# Patient Record
Sex: Male | Born: 1965
Health system: Southern US, Community
[De-identification: ages and names within clinical notes are randomized; demographics above are authoritative.]

## PROBLEM LIST (undated history)

## (undated) DIAGNOSIS — J309 Allergic rhinitis, unspecified: Secondary | ICD-10-CM

## (undated) DIAGNOSIS — I10 Essential (primary) hypertension: Secondary | ICD-10-CM

## (undated) DIAGNOSIS — M1990 Unspecified osteoarthritis, unspecified site: Secondary | ICD-10-CM

## (undated) DIAGNOSIS — K0501 Acute gingivitis, non-plaque induced: Secondary | ICD-10-CM

## (undated) DIAGNOSIS — J302 Other seasonal allergic rhinitis: Secondary | ICD-10-CM

## (undated) HISTORY — DX: Other seasonal allergic rhinitis: J30.2

## (undated) HISTORY — DX: Allergic rhinitis, unspecified: J30.9

## (undated) HISTORY — DX: Acute gingivitis, non-plaque induced: K05.01

## (undated) HISTORY — PX: FINGER FRACTURE SURGERY: SHX638

## (undated) HISTORY — DX: Essential (primary) hypertension: I10

## (undated) HISTORY — PX: WISDOM TOOTH EXTRACTION: SHX21

---

## 2008-06-17 ENCOUNTER — Ambulatory Visit: Payer: Self-pay | Admitting: Internal Medicine

## 2008-06-17 DIAGNOSIS — I10 Essential (primary) hypertension: Secondary | ICD-10-CM

## 2008-06-17 DIAGNOSIS — J309 Allergic rhinitis, unspecified: Secondary | ICD-10-CM

## 2008-06-17 HISTORY — DX: Allergic rhinitis, unspecified: J30.9

## 2008-06-17 HISTORY — DX: Essential (primary) hypertension: I10

## 2009-06-01 ENCOUNTER — Encounter: Payer: Self-pay | Admitting: Internal Medicine

## 2009-08-15 ENCOUNTER — Ambulatory Visit: Payer: Self-pay | Admitting: Internal Medicine

## 2009-08-15 DIAGNOSIS — K120 Recurrent oral aphthae: Secondary | ICD-10-CM

## 2009-08-29 ENCOUNTER — Telehealth: Payer: Self-pay | Admitting: Internal Medicine

## 2009-09-01 ENCOUNTER — Telehealth: Payer: Self-pay | Admitting: Internal Medicine

## 2009-09-05 ENCOUNTER — Encounter: Payer: Self-pay | Admitting: Internal Medicine

## 2009-09-07 ENCOUNTER — Ambulatory Visit: Payer: Self-pay | Admitting: Family Medicine

## 2009-09-07 DIAGNOSIS — K0501 Acute gingivitis, non-plaque induced: Secondary | ICD-10-CM

## 2009-09-07 HISTORY — DX: Acute gingivitis, non-plaque induced: K05.01

## 2010-02-09 ENCOUNTER — Ambulatory Visit: Payer: Self-pay | Admitting: Internal Medicine

## 2010-04-25 NOTE — Assessment & Plan Note (Signed)
Summary: 6 month fup//ccm/pt rescd//ccm   Vital Signs:  Cooley profile:   45 year old male Weight:      194 pounds Temp:     97.9 degrees F oral BP sitting:   110 / 80  (right arm) Cuff size:   regular  Vitals Entered By: Duard Brady LPN (February 09, 2010 8:31 AM) CC: 6 mos rov - doing ok   **declines flu vaccine Is Cooley Diabetic? No   CC:  6 mos rov - doing ok   **declines flu vaccine.  History of Present Illness: Philip Cooley who has a long history of hypertension.  He is seen today for his 6 month follow-up.  He is doing quite well.  No concerns or complaints.  He has well-controlled on amlodipine/benazapril.denies any cough or other side effects.  He does have a history of allergic rhinitis and at the stomatitis which has been stable.  Allergies: 1)  ! Penicillin G Sodium (Penicillin G Sodium)  Past History:  Past Medical History: Reviewed history from 08/15/2009 and no changes required. Allergic rhinitis Hypertension aphthous ulceration  Past Surgical History: Reviewed history from 06/17/2008 and no changes required. none  Family History: Reviewed history from 06/17/2008 and no changes required. father died of HIV-AIDS 48 mother, age 30, history of hypertension, osteoarthritis  One brother, history of hypertension one sister apparent MI at age 6.  History of hypertension and tobacco use  Social History: Reviewed history from 06/17/2008 and no changes required. Married 75 year old daughter and also a grandchild ; 51 year old son Regular exercise-yes-active at the gym and also plays basketball  Review of Systems  The Cooley denies anorexia, fever, weight loss, weight gain, vision loss, decreased hearing, hoarseness, chest pain, syncope, dyspnea on exertion, peripheral edema, prolonged cough, headaches, hemoptysis, abdominal pain, melena, hematochezia, severe indigestion/heartburn, hematuria, incontinence, genital sores, muscle weakness,  suspicious skin lesions, transient blindness, difficulty walking, depression, unusual weight change, abnormal bleeding, enlarged lymph nodes, angioedema, breast masses, and testicular masses.    Physical Exam  General:  Well-developed,well-nourished,in no acute distress; alert,appropriate and cooperative throughout examination Head:  Normocephalic and atraumatic without obvious abnormalities. No apparent alopecia or balding. Eyes:  No corneal or conjunctival inflammation noted. EOMI. Perrla. Funduscopic exam benign, without hemorrhages, exudates or papilledema. Vision grossly normal. Mouth:  Oral mucosa and oropharynx without lesions or exudates.  Teeth in good repair. Neck:  No deformities, masses, or tenderness noted. Lungs:  Normal respiratory effort, chest expands symmetrically. Lungs are clear to auscultation, no crackles or wheezes. Heart:  Normal rate and regular rhythm. S1 and S2 normal without gallop, murmur, click, rub or other extra sounds. Abdomen:  Bowel sounds positive,abdomen soft and non-tender without masses, organomegaly or hernias noted. Msk:  No deformity or scoliosis noted of thoracic or lumbar spine.   Pulses:  R and L carotid,radial,femoral,dorsalis pedis and posterior tibial pulses are full and equal bilaterally Extremities:  No clubbing, cyanosis, edema, or deformity noted with normal full range of motion of all joints.     Impression & Recommendations:  Problem # 1:  HYPERTENSION (ICD-401.9)  His updated medication list for this problem includes:    Lotrel 5-10 Mg Caps (Amlodipine besy-benazepril hcl) .Marland Kitchen... 1 once daily  His updated medication list for this problem includes:    Lotrel 5-10 Mg Caps (Amlodipine besy-benazepril hcl) .Marland Kitchen... 1 once daily  Problem # 2:  ALLERGIC RHINITIS (ICD-477.9)  Complete Medication List: 1)  Lotrel 5-10 Mg Caps (Amlodipine besy-benazepril hcl) .Marland Kitchen.. 1 once daily 2)  First-dukes Mouthwash Susp (Diphenhyd-hydrocort-nystatin) .Marland Kitchen.. 1  teaspoon swish and swallow 4 times daily 3)  Kenalog in Orabase  .... Use as directed four times a day as needed  Cooley Instructions: 1)  Please schedule a follow-up appointment in 6 months. 2)  Limit your Sodium (Salt). 3)  It is important that you exercise regularly at least 20 minutes 5 times a week. If you develop chest pain, have severe difficulty breathing, or feel very tired , stop exercising immediately and seek medical attention. 4)  Check your Blood Pressure regularly. If it is above: 150/90 you should make an appointment. Prescriptions: LOTREL 5-10 MG CAPS (AMLODIPINE BESY-BENAZEPRIL HCL) 1 once daily  #90 x 5   Entered and Authorized by:   Gordy Savers  MD   Signed by:   Gordy Savers  MD on 02/09/2010   Method used:   Faxed to ...       [facility] (mail-order)             , [place]         Ph:        Fax: [phone]   RxID:   [id number] LOTREL 5-10 MG CAPS (AMLODIPINE BESY-BENAZEPRIL HCL) 1 once daily  #90 x 5   Entered and Authorized by:   Gordy Savers  MD   Signed by:   Gordy Savers  MD on 02/09/2010   Method used:   Print then Give to Cooley   RxID:   [id number] LOTREL 5-10 MG CAPS (AMLODIPINE BESY-BENAZEPRIL HCL) 1 once daily  #90 x 5   Entered and Authorized by:   Gordy Savers  MD   Signed by:   Gordy Savers  MD on 02/09/2010   Method used:   Electronically to        [facility] [address] #[id number]* (retail)       [address] [place], [place]  [address]       Ph: [phone]       Fax: [phone]   RxID:   [id number]    Orders Added: 1)  Est. Cooley Level III [01093]

## 2010-04-25 NOTE — Assessment & Plan Note (Signed)
Summary: MOUTH SORES/NJR   Vital Signs:  Patient profile:   45 year old male Weight:      186 pounds Temp:     98.3 degrees F oral BP sitting:   110 / 70  (right arm) Cuff size:   regular  Vitals Entered By: Duard Brady LPN (Aug 15, 2009 3:25 PM) CC: c/o sores in mouth returning - seen in urgent care 1 mos ago given streoid for same dx  Is Patient Diabetic? No   CC:  c/o sores in mouth returning - seen in urgent care 1 mos ago given streoid for same dx .  History of Present Illness: 45 year old patient who was evaluated at the urgent care approximately month ago for some oral ulcerations.  He apparently was clear with Dukes Magic mouthwash with only partial benefit, but then responded to more topical cortisone preparations.  He describes some discomfort, mainly involving the buccal mucosa.  He has treated hypertension, and otherwise takes no medications  Preventive Screening-Counseling & Management  Alcohol-Tobacco     Smoking Status: never  Allergies: 1)  ! Penicillin G Sodium (Penicillin G Sodium)  Past History:  Past Medical History: Allergic rhinitis Hypertension aphthous ulceration  Past Surgical History: Reviewed history from 06/17/2008 and no changes required. none  Social History: Smoking Status:  never  Physical Exam  General:  Well-developed,well-nourished,in no acute distress; alert,appropriate and cooperative throughout examination Head:  Normocephalic and atraumatic without obvious abnormalities. No apparent alopecia or balding. Mouth:  aphthous ulcer(s).  scattered superficial ulcerations were noted involving primarily the buccal mucosal area near the lower dental line Neck:  No deformities, masses, or tenderness noted. no adenopathy   Impression & Recommendations:  Problem # 1:  HYPERTENSION (ICD-401.9)  His updated medication list for this problem includes:    Lotrel 5-10 Mg Caps (Amlodipine besy-benazepril hcl) .Marland Kitchen... 1 once daily  His  updated medication list for this problem includes:    Lotrel 5-10 Mg Caps (Amlodipine besy-benazepril hcl) .Marland Kitchen... 1 once daily  Problem # 2:  APHTHOUS STOMATITIS (ICD-528.2)  Complete Medication List: 1)  Lotrel 5-10 Mg Caps (Amlodipine besy-benazepril hcl) .Marland Kitchen.. 1 once daily 2)  First-dukes Mouthwash Susp (Diphenhyd-hydrocort-nystatin) .Marland Kitchen.. 1 teaspoon swish and swallow 4 times daily  Patient Instructions: 1)  Please schedule a follow-up appointment in 4 months. 2)  Limit your Sodium (Salt). 3)  It is important that you exercise regularly at least 20 minutes 5 times a week. If you develop chest pain, have severe difficulty breathing, or feel very tired , stop exercising immediately and seek medical attention. Prescriptions: FIRST-DUKES MOUTHWASH  SUSP (DIPHENHYD-HYDROCORT-NYSTATIN) 1 teaspoon swish and swallow 4 times daily  #6 oz x 0   Entered and Authorized by:   Gordy Savers  MD   Signed by:   Gordy Savers  MD on 08/15/2009   Method used:   Electronically to        [id number]* (retail)       [address] [place], [place]  [address]       Ph: [phone]       Fax: [phone]   RxID:   [id number] LOTREL 5-10 MG CAPS (AMLODIPINE BESY-BENAZEPRIL HCL) 1 once daily  #90 x 2   Entered and Authorized by:   Gordy Savers  MD   Signed by:   Gordy Savers  MD on 08/15/2009   Method used:   Electronically to  [facility] [id number]* (retail)       [address] [place], [place]  [address]       Ph: [phone]       Fax: [phone]   RxID:   [id number]

## 2010-04-25 NOTE — Assessment & Plan Note (Signed)
Summary: mouth lesions?/dm   Vital Signs:  Patient profile:   45 year old male Weight:      187 pounds Temp:     98.2 degrees F oral BP sitting:   120 / 80  (left arm)  Vitals Entered By: Kathrynn Speed CMA (September 07, 2009 4:34 PM) CC: Mouth Lesions for 2 months   History of Present Illness: Two-month history of gum irritation upper and lower gums with some buccal mucosal irritation as well. Has been seen both here and at urgent care Center and prescribed various medications which have not helped much. He has tried [employer] mouthwash which did not seem to help. Tried some type of paste presumably steroid paste from urgent care that did help some. Diagnosed with aphthous stomatitis. Symptoms have not really progressed but no better. No difficulty swallowing. Nonsmoker. No oral tobacco use. No recent use of hydrogen peroxide strips.  No diabetes.  Current Medications (verified): 1)  Lotrel 5-10 Mg Caps (Amlodipine Besy-Benazepril Hcl) .Marland Kitchen.. 1 Once Daily 2)  First-Dukes Mouthwash  Susp (Diphenhyd-Hydrocort-Nystatin) .Marland Kitchen.. 1 Teaspoon Swish and Swallow 4 Times Daily  Allergies (verified): 1)  ! Penicillin G Sodium (Penicillin G Sodium)  Past History:  Past Medical History: Last updated: 08/15/2009 Allergic rhinitis Hypertension aphthous ulceration  Review of Systems  The patient denies anorexia, fever, and weight loss.    Physical Exam  General:  Well-developed,well-nourished,in no acute distress; alert,appropriate and cooperative throughout examination Head:  no facial swelling evident Mouth:  patient has some gingivitis changes with gingival inflammation which is mild and some mild gum erythema diffusely especially lower gum line and bilaterally. Patient is noted have some mild erythema around Stensen's duct bilaterally but no purulent drainage. Neck:  No deformities, masses, or tenderness noted. Lungs:  Normal respiratory effort, chest expands symmetrically. Lungs are clear  to auscultation, no crackles or wheezes. Heart:  Normal rate and regular rhythm. S1 and S2 normal without gallop, murmur, click, rub or other extra sounds.   Impression & Recommendations:  Problem # 1:  ACUTE GINGIVITIS NONPLAQUE INDUCED (ICD-523.01) have recommended dental eval.  In meantime, reduce sugar intake, gently brushing of teeth, refill Kenalog in orabase which helped buccal irritation some.  No evidence for leukoplakia or thrush.  Complete Medication List: 1)  Lotrel 5-10 Mg Caps (Amlodipine besy-benazepril hcl) .Marland Kitchen.. 1 once daily 2)  First-dukes Mouthwash Susp (Diphenhyd-hydrocort-nystatin) .Marland Kitchen.. 1 teaspoon swish and swallow 4 times daily 3)  Kenalog in Orabase  .... Use as directed four times a day as needed  Patient Instructions: 1)  set up appointment with dentist for further evaluation 2)  Reduce sugar and starch intake 3)  Continue regular brushing of teeth and gentle flossing daily Prescriptions: KENALOG IN ORABASE use as directed four times a day as needed  #5 gm x 1   Entered by:   Sid Falcon LPN   Authorized by:   Evelena Peat MD   Signed by:   Sid Falcon LPN on 41/28/7867   Method used:   Telephoned to ...       [id number]* (retail)       [address] [place], [place]  [address]       Ph: [phone]       Fax: [phone]   RxID:   [id number]

## 2010-04-25 NOTE — Progress Notes (Signed)
Summary: [facility]  Phone Note Call from Patient Call back at Home Phone [phone]   Caller: Patient Call For: Philip Savers  MD Summary of Call: [facility] [phone]  Initial call taken by: Heron Sabins,  September 01, 2009 8:35 AM    Prescriptions: LOTREL 5-10 MG CAPS (AMLODIPINE BESY-BENAZEPRIL HCL) 1 once daily  #90 x 2   Entered by:   Duard Brady LPN   Authorized by:   Philip Savers  MD   Signed by:   Duard Brady LPN on 08/65/7846   Method used:   Faxed to ...       [facility] (mail-order)             ,          Ph: [phone]       Fax: [phone]   RxID:   [id number]  re [facility]ed to [facility]

## 2010-04-25 NOTE — Medication Information (Signed)
Summary: Coverage Approval for Amlodipine  Coverage Approval for Amlodipine   Imported By: Maryln Gottron 06/06/2009 13:02:37  _____________________________________________________________________  External Attachment:    Type:   Image     Comment:   External Document

## 2010-04-25 NOTE — Progress Notes (Signed)
Summary:  refill lotrel mail order  Phone Note Refill Request Message from:  Patient---live call on *******new pharmacy****  Refills Requested: Medication #1:  LOTREL 5-10 MG CAPS 1 once daily   Brand Name Necessary? No [facility].....******[facility] pharmacy******. Cannot fill thru local pharmacy anymore. They denied his local refill.  Initial call taken by: Warnell Forester,  August 29, 2009 10:53 AM Caller: Patient  Follow-up for Phone Call        done. KIK Follow-up by: Duard Brady LPN,  August 29, 4257 11:10 AM    Prescriptions: LOTREL 5-10 MG CAPS (AMLODIPINE BESY-BENAZEPRIL HCL) 1 once daily  #90 x 2   Entered by:   Duard Brady LPN   Authorized by:   Gordy Savers  MD   Signed by:   Duard Brady LPN on 56/38/7564   Method used:   Faxed to ...       [facility] (mail-order)             ,          Ph: [phone]       Fax: [phone]   RxID:   [id number]  [facility]/t [facility] thru local [facility]. Pt aware. KIK

## 2010-04-25 NOTE — Medication Information (Signed)
Summary: Coverage Approval for Amlodipine Besylate  Coverage Approval for Amlodipine Besylate   Imported By: Maryln Gottron 09/08/2009 08:57:17  _____________________________________________________________________  External Attachment:    Type:   Image     Comment:   External Document

## 2010-06-23 ENCOUNTER — Encounter: Payer: Self-pay | Admitting: Internal Medicine

## 2010-06-23 ENCOUNTER — Ambulatory Visit (INDEPENDENT_AMBULATORY_CARE_PROVIDER_SITE_OTHER): Payer: 59 | Admitting: Internal Medicine

## 2010-06-23 DIAGNOSIS — I10 Essential (primary) hypertension: Secondary | ICD-10-CM

## 2010-06-23 DIAGNOSIS — J309 Allergic rhinitis, unspecified: Secondary | ICD-10-CM

## 2010-06-23 DIAGNOSIS — R51 Headache: Secondary | ICD-10-CM

## 2010-06-23 MED ORDER — TRAMADOL HCL 50 MG PO TABS: 50.0000 mg | ORAL_TABLET | Freq: Four times a day (QID) | ORAL | Status: DC | PRN

## 2010-06-23 NOTE — Progress Notes (Signed)
  Subjective:    Patient ID: Philip Cooley, male    DOB: [birth year], 45 y.o.   MRN: [id number]  HPI  45 year old patient who is seen today for followup. He has not been seen in approximately 10 months. He does have a history of hypertension and allergic rhinitis. He states he has a history of migraine headaches in the past it had been well controlled with Excedrin Migraine. For the past 2 months has had increasing posterior neck pain. He is taking very little the way of medication. He apparently was seen for a wellness visits elsewhere approximately 2 months ago and blood pressure was slightly high at that time he has been on Lotrel for blood pressure control. Not much in way of allergy symptoms.    Review of Systems  Constitutional: Negative for fever, chills, appetite change and fatigue.  HENT: Negative for hearing loss, ear pain, congestion, sore throat, trouble swallowing, neck stiffness, dental problem, voice change and tinnitus.   Eyes: Negative for pain, discharge and visual disturbance.  Respiratory: Negative for cough, chest tightness, wheezing and stridor.   Cardiovascular: Negative for chest pain, palpitations and leg swelling.  Gastrointestinal: Negative for nausea, vomiting, abdominal pain, diarrhea, constipation, blood in stool and abdominal distention.  Genitourinary: Negative for urgency, hematuria, flank pain, discharge, difficulty urinating and genital sores.  Musculoskeletal: Negative for myalgias, back pain, joint swelling, arthralgias and gait problem.  Skin: Negative for rash.  Neurological: Positive for headaches. Negative for dizziness, syncope, speech difficulty, weakness and numbness.  Hematological: Negative for adenopathy. Does not bruise/bleed easily.  Psychiatric/Behavioral: Negative for behavioral problems and dysphoric mood. The patient is not nervous/anxious.        Objective:   Physical Exam  Constitutional: He is oriented to person, place, and time. He  appears well-developed and well-nourished. No distress.       Blood pressure 120/80.  HENT:  Head: Normocephalic.  Right Ear: External ear normal.  Left Ear: External ear normal.  Eyes: Conjunctivae and EOM are normal.  Neck: Normal range of motion. Neck supple.  Cardiovascular: Normal rate and normal heart sounds.   Pulmonary/Chest: Breath sounds normal.  Abdominal: Bowel sounds are normal.  Musculoskeletal: Normal range of motion. He exhibits no edema and no tenderness.  Lymphadenopathy:    He has no cervical adenopathy.  Neurological: He is alert and oriented to person, place, and time.  Psychiatric: He has a normal mood and affect. His behavior is normal.          Assessment & Plan:  Headache. Suspect this is more from muscular ligamentous neck pain. Does not sound like a migraine syndrome or related to allergies. We'll treat allergies symptomatically with a once a day nonsedating antihistamine. We'll try tramadol if headaches are refractory to Excedrin Migraine which have been quite helpful in the past

## 2010-06-23 NOTE — Patient Instructions (Signed)
Limit your sodium (Salt) intake  Please check your blood pressure on a regular basis.  If it is consistently greater than 150/90, please make an office appointment.  Return in 6 months for follow-up  Call or return to clinic prn if these symptoms worsen or fail to improve as anticipated.   

## 2010-07-05 ENCOUNTER — Other Ambulatory Visit: Payer: Self-pay | Admitting: Internal Medicine

## 2010-07-05 NOTE — Telephone Encounter (Signed)
Pt needs new rx amlodipine-benzepril 5-10mg  #90 with 3 refills.  Please call [phone] [facility] or [facility] fax

## 2010-07-06 MED ORDER — AMLODIPINE BESY-BENAZEPRIL HCL 5-10 MG PO CAPS: 1.0000 | ORAL_CAPSULE | Freq: Every day | ORAL | Status: DC

## 2010-07-06 NOTE — Telephone Encounter (Signed)
Sent to [facility] 

## 2010-08-10 ENCOUNTER — Other Ambulatory Visit (INDEPENDENT_AMBULATORY_CARE_PROVIDER_SITE_OTHER): Payer: 59 | Admitting: Internal Medicine

## 2010-08-10 DIAGNOSIS — Z0000 Encounter for general adult medical examination without abnormal findings: Secondary | ICD-10-CM

## 2010-08-10 LAB — CBC WITH DIFFERENTIAL/PLATELET
Eosinophils Relative: 5.3 % — ABNORMAL HIGH (ref 0.0–5.0)
HCT: 50.7 % (ref 39.0–52.0)
Lymphs Abs: 1.9 10*3/uL (ref 0.7–4.0)
MCV: 89.4 fl (ref 78.0–100.0)
Monocytes Absolute: 0.6 10*3/uL (ref 0.1–1.0)
Platelets: 208 10*3/uL (ref 150.0–400.0)
RDW: 13.7 % (ref 11.5–14.6)
WBC: 5.6 10*3/uL (ref 4.5–10.5)

## 2010-08-10 LAB — POCT URINALYSIS DIPSTICK
Bilirubin, UA: NEGATIVE
Glucose, UA: NEGATIVE
Ketones, UA: NEGATIVE
Spec Grav, UA: 1.03

## 2010-08-10 LAB — BASIC METABOLIC PANEL
BUN: 13 mg/dL (ref 6–23)
Chloride: 107 mEq/L (ref 96–112)
Glucose, Bld: 84 mg/dL (ref 70–99)
Potassium: 4.1 mEq/L (ref 3.5–5.1)

## 2010-08-10 LAB — TSH: TSH: 0.76 u[IU]/mL (ref 0.35–5.50)

## 2010-08-10 LAB — LIPID PANEL
Cholesterol: 137 mg/dL (ref 0–200)
LDL Cholesterol: 83 mg/dL (ref 0–99)
Triglycerides: 47 mg/dL (ref 0.0–149.0)

## 2010-08-10 LAB — HEPATIC FUNCTION PANEL
ALT: 25 U/L (ref 0–53)
Albumin: 3.4 g/dL — ABNORMAL LOW (ref 3.5–5.2)
Total Bilirubin: 0.9 mg/dL (ref 0.3–1.2)

## 2010-08-17 ENCOUNTER — Encounter: Payer: Self-pay | Admitting: Internal Medicine

## 2010-08-17 ENCOUNTER — Ambulatory Visit (INDEPENDENT_AMBULATORY_CARE_PROVIDER_SITE_OTHER): Payer: 59 | Admitting: Internal Medicine

## 2010-08-17 VITALS — BP 114/70 | HR 70 | Temp 98.1°F | Resp 16 | Ht 66.0 in | Wt 186.0 lb

## 2010-08-17 DIAGNOSIS — Z0000 Encounter for general adult medical examination without abnormal findings: Secondary | ICD-10-CM

## 2010-08-17 MED ORDER — AMLODIPINE BESY-BENAZEPRIL HCL 5-10 MG PO CAPS: 1.0000 | ORAL_CAPSULE | Freq: Every day | ORAL | Status: DC

## 2010-08-17 NOTE — Patient Instructions (Signed)
Limit your sodium (Salt) intake    It is important that you exercise regularly, at least 20 minutes 3 to 4 times per week.  If you develop chest pain or shortness of breath seek  medical attention.  Please check your blood pressure on a regular basis.  If it is consistently greater than 150/90, please make an office appointment.  Return in one year for follow-up  

## 2010-08-17 NOTE — Progress Notes (Signed)
  Subjective:    Patient ID: Philip Cooley, male    DOB: 07-22-1965, 45 y.o.   MRN: [id number]  HPI  45 year old patient who is seen today for a health maintenance examination. He has a long history of hypertension which has been well controlled. No concerns or complaints. He remains a very active lifestyle with regular exercise at his health club.  Past Medical History  Diagnosis Date  . ACUTE GINGIVITIS NONPLAQUE INDUCED 09/07/2009  . ALLERGIC RHINITIS 06/17/2008  . HYPERTENSION 06/17/2008   No past surgical history on file.  reports that he has never smoked. He has never used smokeless tobacco. He reports that he does not drink alcohol or use illicit drugs. family history is not on file. Allergies  Allergen Reactions  . Penicillins        Review of Systems  Constitutional: Negative for fever, chills, activity change, appetite change and fatigue.  HENT: Negative for hearing loss, ear pain, congestion, rhinorrhea, sneezing, mouth sores, trouble swallowing, neck pain, neck stiffness, dental problem, voice change, sinus pressure and tinnitus.   Eyes: Negative for photophobia, pain, redness and visual disturbance.  Respiratory: Negative for apnea, cough, choking, chest tightness, shortness of breath and wheezing.   Cardiovascular: Negative for chest pain, palpitations and leg swelling.  Gastrointestinal: Negative for nausea, vomiting, abdominal pain, diarrhea, constipation, blood in stool, abdominal distention, anal bleeding and rectal pain.  Genitourinary: Negative for dysuria, urgency, frequency, hematuria, flank pain, decreased urine volume, discharge, penile swelling, scrotal swelling, difficulty urinating, genital sores and testicular pain.  Musculoskeletal: Negative for myalgias, back pain, joint swelling, arthralgias and gait problem.  Skin: Negative for color change, rash and wound.  Neurological: Negative for dizziness, tremors, seizures, syncope, facial asymmetry, speech  difficulty, weakness, light-headedness, numbness and headaches.  Hematological: Negative for adenopathy. Does not bruise/bleed easily.  Psychiatric/Behavioral: Negative for suicidal ideas, hallucinations, behavioral problems, confusion, sleep disturbance, self-injury, dysphoric mood, decreased concentration and agitation. The patient is not nervous/anxious.        Objective:   Physical Exam  Constitutional: He appears well-developed and well-nourished.  HENT:  Head: Normocephalic and atraumatic.  Right Ear: External ear normal.  Left Ear: External ear normal.  Nose: Nose normal.  Mouth/Throat: Oropharynx is clear and moist.  Eyes: Conjunctivae and EOM are normal. Pupils are equal, round, and reactive to light. No scleral icterus.  Neck: Normal range of motion. Neck supple. No JVD present. No thyromegaly present.  Cardiovascular: Regular rhythm, normal heart sounds and intact distal pulses.  Exam reveals no gallop and no friction rub.   No murmur heard. Pulmonary/Chest: Effort normal and breath sounds normal. He exhibits no tenderness.  Abdominal: Soft. Bowel sounds are normal. He exhibits no distension and no mass. There is no tenderness.  Genitourinary: Prostate normal and penis normal.  Musculoskeletal: Normal range of motion. He exhibits no edema and no tenderness.  Lymphadenopathy:    He has no cervical adenopathy.  Neurological: He is alert. He has normal reflexes. No cranial nerve deficit. Coordination normal.  Skin: Skin is warm and dry. No rash noted.  Psychiatric: He has a normal mood and affect. His behavior is normal.          Assessment & Plan:    Annual examination Hypertension. Well controlled  We'll continue present regimen low-salt diet and regular exercise routine. Will recheck in one year or when necessary. Home blood pressure monitoring will be continued

## 2011-02-22 ENCOUNTER — Ambulatory Visit (INDEPENDENT_AMBULATORY_CARE_PROVIDER_SITE_OTHER): Payer: 59 | Admitting: Internal Medicine

## 2011-02-22 ENCOUNTER — Encounter: Payer: Self-pay | Admitting: Internal Medicine

## 2011-02-22 DIAGNOSIS — I10 Essential (primary) hypertension: Secondary | ICD-10-CM

## 2011-02-22 DIAGNOSIS — J069 Acute upper respiratory infection, unspecified: Secondary | ICD-10-CM

## 2011-02-22 NOTE — Progress Notes (Signed)
  Subjective:    Patient ID: Philip Cooley, male    DOB: [birth year], 46 y.o.   MRN: [id number]  HPI  45 year old patient who has a history of treated hypertension. For the past 5 days he has had increasing cough congestion rhinorrhea sneezing and some fever and chills. Denies any productive cough wheezing shortness of breath or chest pain. His blood pressure has been well-controlled    Review of Systems  Constitutional: Negative for fever, chills, appetite change and fatigue.  HENT: Positive for congestion, rhinorrhea and voice change. Negative for hearing loss, ear pain, sore throat, trouble swallowing, neck stiffness, dental problem and tinnitus.   Eyes: Negative for pain, discharge and visual disturbance.  Respiratory: Positive for cough. Negative for chest tightness, wheezing and stridor.   Cardiovascular: Negative for chest pain, palpitations and leg swelling.  Gastrointestinal: Negative for nausea, vomiting, abdominal pain, diarrhea, constipation, blood in stool and abdominal distention.  Genitourinary: Negative for urgency, hematuria, flank pain, discharge, difficulty urinating and genital sores.  Musculoskeletal: Negative for myalgias, back pain, joint swelling, arthralgias and gait problem.  Skin: Negative for rash.  Neurological: Negative for dizziness, syncope, speech difficulty, weakness, numbness and headaches.  Hematological: Negative for adenopathy. Does not bruise/bleed easily.  Psychiatric/Behavioral: Negative for behavioral problems and dysphoric mood. The patient is not nervous/anxious.        Objective:   Physical Exam  Constitutional: He is oriented to person, place, and time. He appears well-developed.  HENT:  Head: Normocephalic.  Right Ear: External ear normal.  Left Ear: External ear normal.       Mild erythema of the oropharynx  Eyes: Conjunctivae and EOM are normal.  Neck: Normal range of motion.  Cardiovascular: Normal rate and normal heart sounds.     Pulmonary/Chest: Breath sounds normal.  Abdominal: Bowel sounds are normal.  Musculoskeletal: Normal range of motion. He exhibits no edema and no tenderness.  Neurological: He is alert and oriented to person, place, and time.  Psychiatric: He has a normal mood and affect. His behavior is normal.          Assessment & Plan:    Hypertension well controlled. Viral URI. We'll treat symptomatically

## 2011-02-22 NOTE — Patient Instructions (Signed)
ZUTRIPRO 1 teaspoon every 6 hours as needed for cough and congestion  VIMOVO  1 twice daily for fever pain and inflammation

## 2011-06-26 ENCOUNTER — Encounter: Payer: Self-pay | Admitting: Internal Medicine

## 2011-06-26 ENCOUNTER — Ambulatory Visit (INDEPENDENT_AMBULATORY_CARE_PROVIDER_SITE_OTHER): Payer: 59 | Admitting: Internal Medicine

## 2011-06-26 VITALS — BP 106/74 | Temp 98.5°F | Wt 189.0 lb

## 2011-06-26 DIAGNOSIS — I10 Essential (primary) hypertension: Secondary | ICD-10-CM

## 2011-06-26 NOTE — Progress Notes (Signed)
  Subjective:    Patient ID: Michail Sermon, male    DOB: 1965-05-08, 46 y.o.   MRN: [id number]  HPI  46 year old patient who has a history of treated hypertension. He presents today with a chief complaint of low back pain left lumbar area greater than the right.  The patient first noticed some tightness in the left lumbar area while bowling 4 days ago. Pain worsened throughout the day but for the past 2 days seems to have improved. He has been using naproxen which has been quite helpful. Denies any constitutional complaints. No radicular symptoms    Review of Systems  Musculoskeletal: Positive for back pain.       Objective:   Physical Exam  Constitutional: He appears well-developed and well-nourished. No distress.       Blood pressure well controlled  Musculoskeletal:       The lumbar musculature bilaterally slightly tight and tense straight leg test negative  Full range of motion of both hips           Assessment & Plan:   Musculoskeletal lumbar strain. We'll continue rest naproxen and heat. Hypertension stable  Recheck 6 months

## 2011-06-26 NOTE — Patient Instructions (Signed)
Most patients with low back pain will improve with time over the next two to 6 weeks.  Keep active but avoid any activities that cause pain.  Apply moist heat to the low back area several times daily.  Return in 6 months for follow-up  Please check your blood pressure on a regular basis.  If it is consistently greater than 150/90, please make an office appointment.

## 2011-07-20 ENCOUNTER — Other Ambulatory Visit: Payer: Self-pay | Admitting: Internal Medicine

## 2011-07-20 MED ORDER — AMLODIPINE BESY-BENAZEPRIL HCL 5-10 MG PO CAPS: 1.0000 | ORAL_CAPSULE | Freq: Every day | ORAL | Status: DC

## 2011-07-20 NOTE — Telephone Encounter (Signed)
Pt needs amlodipine-benazepril 5-10mg  #90 with 3 refills [facility] [phone] phone # [phone]

## 2011-07-20 NOTE — Telephone Encounter (Signed)
done

## 2011-09-29 ENCOUNTER — Emergency Department (HOSPITAL_COMMUNITY): Payer: Worker's Compensation

## 2011-09-29 ENCOUNTER — Emergency Department (HOSPITAL_COMMUNITY)
Admission: EM | Admit: 2011-09-29 | Discharge: 2011-09-29 | Disposition: A | Discharge status: Home / Self Care | Payer: Worker's Compensation | Attending: Emergency Medicine | Admitting: Emergency Medicine

## 2011-09-29 ENCOUNTER — Encounter (HOSPITAL_COMMUNITY): Payer: Self-pay | Admitting: Emergency Medicine

## 2011-09-29 DIAGNOSIS — Y9289 Other specified places as the place of occurrence of the external cause: Secondary | ICD-10-CM | POA: Insufficient documentation

## 2011-09-29 DIAGNOSIS — X500XXA Overexertion from strenuous movement or load, initial encounter: Secondary | ICD-10-CM | POA: Insufficient documentation

## 2011-09-29 DIAGNOSIS — S93409A Sprain of unspecified ligament of unspecified ankle, initial encounter: Secondary | ICD-10-CM

## 2011-09-29 DIAGNOSIS — S8990XA Unspecified injury of unspecified lower leg, initial encounter: Secondary | ICD-10-CM | POA: Insufficient documentation

## 2011-09-29 DIAGNOSIS — I10 Essential (primary) hypertension: Secondary | ICD-10-CM | POA: Insufficient documentation

## 2011-09-29 DIAGNOSIS — Y990 Civilian activity done for income or pay: Secondary | ICD-10-CM | POA: Insufficient documentation

## 2011-09-29 MED ORDER — IBUPROFEN 800 MG PO TABS: 800.0000 mg | ORAL_TABLET | Freq: Three times a day (TID) | ORAL | Status: AC

## 2011-09-29 MED ORDER — HYDROCODONE-ACETAMINOPHEN 5-500 MG PO TABS: 1.0000 | ORAL_TABLET | Freq: Four times a day (QID) | ORAL | Status: AC | PRN

## 2011-09-29 NOTE — ED Provider Notes (Signed)
History     CSN: [id number]  Arrival date & time 09/29/11  1108   First MD Initiated Contact with Patient 09/29/11 1131      Chief Complaint  Patient presents with  . Ankle Pain    0726- pt "rolled l/ankle" will at work    (Consider location/radiation/quality/duration/timing/severity/associated sxs/prior treatment) HPI Comments: Patient reports that he rolled his left ankle.  He is currently having pain over the medial malleolus.  He is able to bear weight, but reports increased pain with ambulation and movement.  No prior injury to the ankle.  Patient is a 46 y.o. male presenting with ankle pain. The history is provided by the patient.  Ankle Pain  The incident occurred 1 to 2 hours ago. The incident occurred at work. The pain is present in the left ankle. The quality of the pain is described as aching. The pain is moderate. The pain has been constant since onset. Pertinent negatives include no numbness, no loss of motion, no loss of sensation and no tingling. Associated symptoms comments: Pain with ambulation. The symptoms are aggravated by bearing weight and palpation (bending of the ankle). He has tried nothing for the symptoms.    Past Medical History  Diagnosis Date  . ACUTE GINGIVITIS NONPLAQUE INDUCED 09/07/2009  . ALLERGIC RHINITIS 06/17/2008  . HYPERTENSION 06/17/2008    History reviewed. No pertinent past surgical history.  Family History  Problem Relation Age of Onset  . Diabetes Mother   . Cancer Mother     History  Substance Use Topics  . Smoking status: Never Smoker   . Smokeless tobacco: Never Used  . Alcohol Use: No      Review of Systems  Constitutional: Negative for fever and chills.  HENT: Negative for drooling and trouble swallowing.   Gastrointestinal: Negative for nausea and vomiting.  Musculoskeletal: Negative for joint swelling.       Pain with ambulation  Skin: Negative for color change and wound.  Neurological: Negative for tingling and  numbness.    Allergies  Penicillins  Home Medications   Current Outpatient Rx  Name Route Sig Dispense Refill  . AMLODIPINE BESY-BENAZEPRIL HCL 5-10 MG PO CAPS Oral Take 1 capsule by mouth daily. 90 capsule 3  . CETIRIZINE HCL 10 MG PO TABS Oral Take 10 mg by mouth daily.    . ADULT MULTIVITAMIN W/MINERALS CH Oral Take 1 tablet by mouth daily.    Marland Kitchen OVER THE COUNTER MEDICATION Oral Take 1 capsule by mouth daily. Over the counter for joints-not glucosamine      BP 112/77  Pulse 66  Temp 98.6 F (37 C) (Oral)  Resp 18  SpO2 99%  Physical Exam  Nursing note and vitals reviewed. Constitutional: He is oriented to person, place, and time. He appears well-developed and well-nourished. No distress.  HENT:  Head: Normocephalic and atraumatic. No trismus in the jaw.  Mouth/Throat: Uvula is midline, oropharynx is clear and moist and mucous membranes are normal. Abnormal dentition. No dental abscesses or uvula swelling. No oropharyngeal exudate, posterior oropharyngeal edema, posterior oropharyngeal erythema or tonsillar abscesses.  Eyes: Conjunctivae and EOM are normal.  Neck: Normal range of motion and full passive range of motion without pain. Neck supple.  Cardiovascular: Normal rate, regular rhythm, normal heart sounds and intact distal pulses.   Pulses:      Dorsalis pedis pulses are 2+ on the right side, and 2+ on the left side.  Pulmonary/Chest: Effort normal and breath sounds normal. No respiratory  distress. He has no wheezes.  Musculoskeletal: Normal range of motion.       Left ankle: He exhibits no ecchymosis, no deformity and normal pulse. tenderness. Medial malleolus tenderness found. No lateral malleolus tenderness found. Achilles tendon normal. Achilles tendon exhibits no pain and no defect.       Pain with ROM of left ankle  Neurological: He is alert and oriented to person, place, and time. No sensory deficit.  Skin: Skin is warm and dry. He is not diaphoretic.    ED  Course  Procedures (including critical care time)  Labs Reviewed - No data to display Dg Ankle Complete Left  09/29/2011  *RADIOLOGY REPORT*  Clinical Data: Larey Seat and injured left ankle, medial pain.  LEFT ANKLE COMPLETE - 3+ VIEW  Comparison: None.  Findings: Well corticated ossific fragment adjacent to the tip of medial malleolus.  No evidence of acute fracture or dislocation. Ankle mortise intact with well-preserved joint space.  No visible joint effusion.  Small plantar calcaneal spur.  IMPRESSION: Accessory ossicle or old injury with dystrophic calcification adjacent to the tip of the medial malleolus.  No acute fractures. Small plantar calcaneal spur.  Original Report Authenticated By: Arnell Sieving, M.D.     No diagnosis found.    MDM  Negative xray.  Neurovascularly intact.  Patient given ankle ASO.  Patient declined crutches.        Pascal Lux [place], PA-C 09/29/11 1547

## 2011-09-29 NOTE — ED Notes (Signed)
Pt reports that he rolled l/ankle at work, pt was walking arounf side of wood palate and stepped on a piece of wood

## 2011-09-30 NOTE — ED Provider Notes (Signed)
Medical screening examination/treatment/procedure(s) were performed by non-physician practitioner and as supervising physician I was immediately available for consultation/collaboration.  Carrolyn Hilmes T Regana Kemple, MD 09/30/11 1512 

## 2011-11-06 ENCOUNTER — Telehealth: Payer: Self-pay | Admitting: Internal Medicine

## 2011-11-06 MED ORDER — AMLODIPINE BESY-BENAZEPRIL HCL 5-10 MG PO CAPS: 1.0000 | ORAL_CAPSULE | Freq: Every day | ORAL | Status: DC

## 2011-11-06 NOTE — Telephone Encounter (Signed)
Patient called stating that he need a refill of his Lotrel sent to [facility] and he also is completely out and would like to have some called into [facility] [place]. Please assist.

## 2011-11-06 NOTE — Telephone Encounter (Signed)
Done to both

## 2013-01-27 ENCOUNTER — Ambulatory Visit (INDEPENDENT_AMBULATORY_CARE_PROVIDER_SITE_OTHER): Payer: 59 | Admitting: Internal Medicine

## 2013-01-27 ENCOUNTER — Encounter: Payer: Self-pay | Admitting: Internal Medicine

## 2013-01-27 VITALS — BP 132/90 | HR 66 | Temp 98.2°F | Resp 20 | Wt 181.0 lb

## 2013-01-27 DIAGNOSIS — I10 Essential (primary) hypertension: Secondary | ICD-10-CM

## 2013-01-27 DIAGNOSIS — J309 Allergic rhinitis, unspecified: Secondary | ICD-10-CM

## 2013-01-27 DIAGNOSIS — Z0000 Encounter for general adult medical examination without abnormal findings: Secondary | ICD-10-CM

## 2013-01-27 MED ORDER — AMLODIPINE BESY-BENAZEPRIL HCL 5-10 MG PO CAPS: 1.0000 | ORAL_CAPSULE | Freq: Every day | ORAL | Status: DC

## 2013-01-27 NOTE — Patient Instructions (Signed)
Limit your sodium (Salt) intake  Please check your blood pressure on a regular basis.  If it is consistently greater than 150/90, please make an office appointment.  Return in one year for follow-up  

## 2013-01-27 NOTE — Progress Notes (Signed)
  Subjective:    Patient ID: Philip Cooley, male    DOB: [birth year], 47 y.o.   MRN: [id number]  HPI  Pre-visit discussion using our clinic review tool. No additional management support is needed unless otherwise documented below in the visit note.  Wt Readings from Last 3 Encounters:  01/27/13 181 lb (82.101 kg)  06/26/11 189 lb (85.73 kg)  02/22/11 188 lb (85.276 kg)    BP Readings from Last 3 Encounters:  01/27/13 132/90  09/29/11 109/62  06/26/11 106/74    Review of Systems     Objective:   Physical Exam        Assessment & Plan:

## 2013-01-27 NOTE — Progress Notes (Signed)
Subjective:    Patient ID: Philip Cooley, male    DOB: 02-05-1966, 47 y.o.   MRN: [id number]  HPI  47 year old patient who has a history of hypertension as well as allergic rhinitis. He has not been seen in over one year. He is seen today for health maintenance. He had a health screen at work that included a laboratory draw. No concerns or complaints. 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 He has had some modest weight loss over the past year he has treated hypertension which has been stable. His allergies have been stable No new concerns or complaints  Past Medical History  Diagnosis Date  . ACUTE GINGIVITIS NONPLAQUE INDUCED 09/07/2009  . ALLERGIC RHINITIS 06/17/2008  . HYPERTENSION 06/17/2008    History   Social History  . Marital Status: Married    Spouse Name: N/A    Number of Children: N/A  . Years of Education: N/A   Occupational History  . Not on file.   Social History Main Topics  . Smoking status: Never Smoker   . Smokeless tobacco: Never Used  . Alcohol Use: No  . Drug Use: No  . Sexual Activity: Not on file   Other Topics Concern  . Not on file   Social History Narrative  . No narrative on file    History reviewed. No pertinent past surgical history.  Family History  Problem Relation Age of Onset  . Diabetes Mother   . Cancer Mother     Allergies  Allergen Reactions  . Penicillins Swelling    Current Outpatient Prescriptions on File Prior to Visit  Medication Sig Dispense Refill  . cetirizine (ZYRTEC) 10 MG tablet Take 10 mg by mouth daily.      . Multiple Vitamin (MULTIVITAMIN WITH MINERALS) TABS Take 1 tablet by mouth daily.      Marland Kitchen OVER THE COUNTER MEDICATION Take 1 capsule by mouth daily. Over the counter for joints-not glucosamine       No current facility-administered medications on file prior to visit.    BP 132/90  Pulse 66  Temp(Src) 98.2 F (36.8 C) (Oral)  Resp 20  Wt 181  lb (82.101 kg)  SpO2 97%       Review of Systems  Constitutional: Negative for fever, chills, appetite change and fatigue.  HENT: Negative for congestion, dental problem, ear pain, hearing loss, sore throat, tinnitus, trouble swallowing and voice change.   Eyes: Negative for pain, discharge and visual disturbance.  Respiratory: Negative for cough, chest tightness, wheezing and stridor.   Cardiovascular: Negative for chest pain, palpitations and leg swelling.  Gastrointestinal: Negative for nausea, vomiting, abdominal pain, diarrhea, constipation, blood in stool and abdominal distention.  Genitourinary: Negative for urgency, hematuria, flank pain, discharge, difficulty urinating and genital sores.  Musculoskeletal: Negative for arthralgias, back pain, gait problem, joint swelling, myalgias and neck stiffness.  Skin: Negative for rash.  Neurological: Negative for dizziness, syncope, speech difficulty, weakness, numbness and headaches.  Hematological: Negative for adenopathy. Does not bruise/bleed easily.  Psychiatric/Behavioral: Negative for behavioral problems and dysphoric mood. The patient is not nervous/anxious.        Objective:   Physical Exam  Constitutional: He is oriented to person, place, and time. He appears well-developed.  Repeat blood pressure 120/84  HENT:  Head: Normocephalic.  Right Ear: External ear normal.  Left Ear: External ear normal.  Eyes: Conjunctivae and EOM are normal.  Neck: Normal range of motion.  Cardiovascular: Normal rate and normal heart sounds.   Pulmonary/Chest: Breath sounds normal.  Abdominal: Bowel sounds are normal.  Musculoskeletal: Normal range of motion. He exhibits no edema and no tenderness.  Neurological: He is alert and oriented to person, place, and time.  Psychiatric: He has a normal mood and affect. His behavior is normal.          Assessment & Plan:   Health maintenance examination Hypertension stable History of  allergic rhinitis stable  Patient had a lateral at work. Will complete paperwork for his annual physical when available and fax More  regular home blood pressure monitoring  encouraged Low-salt diet recommended

## 2013-08-18 ENCOUNTER — Ambulatory Visit (INDEPENDENT_AMBULATORY_CARE_PROVIDER_SITE_OTHER): Payer: 59 | Admitting: Internal Medicine

## 2013-08-18 ENCOUNTER — Encounter: Payer: Self-pay | Admitting: Internal Medicine

## 2013-08-18 VITALS — BP 130/90 | HR 95 | Temp 98.1°F | Ht 66.0 in | Wt 180.0 lb

## 2013-08-18 DIAGNOSIS — I10 Essential (primary) hypertension: Secondary | ICD-10-CM

## 2013-08-18 DIAGNOSIS — M5010 Cervical disc disorder with radiculopathy, unspecified cervical region: Secondary | ICD-10-CM

## 2013-08-18 DIAGNOSIS — M5412 Radiculopathy, cervical region: Secondary | ICD-10-CM

## 2013-08-18 MED ORDER — TRAMADOL HCL 50 MG PO TABS: 50.0000 mg | ORAL_TABLET | Freq: Four times a day (QID) | ORAL | Status: AC | PRN

## 2013-08-18 MED ORDER — METHYLPREDNISOLONE ACETATE 80 MG/ML IJ SUSP
80.0000 mg | Freq: Once | INTRAMUSCULAR | Status: AC
  Administered 2013-08-18: 80 mg via INTRAMUSCULAR

## 2013-08-18 NOTE — Progress Notes (Signed)
Pre visit review using our clinic review tool, if applicable. No additional management support is needed unless otherwise documented below in the visit note. 

## 2013-08-18 NOTE — Progress Notes (Signed)
Subjective:    Patient ID: Philip Cooley, male    DOB: [birth year], 48 y.o.   MRN: [id number]  HPI 48 year old patient who presents with a 2 and half week history of upper back, left shoulder and left arm pain.  Shortly before the onset of the symptoms.  He was working out at his gym and doing shoulder shrugs with 75 pound weights on the left.  Pain is aggravated with prolonged sitting, especially when it is extended back.  Pain is relieved by laying flat.  He has no discomfort at night, but if he sleeps on his side, he must raise his left arm above the head for symptom relief.  No motor weakness.  He describes some paresthesias down the inside of his left arm involving the fourth and fifth digits  Past Medical History  Diagnosis Date  . ACUTE GINGIVITIS NONPLAQUE INDUCED 09/07/2009  . ALLERGIC RHINITIS 06/17/2008  . HYPERTENSION 06/17/2008    History   Social History  . Marital Status: Married    Spouse Name: N/A    Number of Children: N/A  . Years of Education: N/A   Occupational History  . Not on file.   Social History Main Topics  . Smoking status: Never Smoker   . Smokeless tobacco: Never Used  . Alcohol Use: No  . Drug Use: No  . Sexual Activity: Not on file   Other Topics Concern  . Not on file   Social History Narrative  . No narrative on file    History reviewed. No pertinent past surgical history.  Family History  Problem Relation Age of Onset  . Diabetes Mother   . Cancer Mother     Allergies  Allergen Reactions  . Penicillins Swelling    Current Outpatient Prescriptions on File Prior to Visit  Medication Sig Dispense Refill  . amLODipine-benazepril (LOTREL) 5-10 MG per capsule Take 1 capsule by mouth daily.  90 capsule  3  . cetirizine (ZYRTEC) 10 MG tablet Take 10 mg by mouth daily.      . Multiple Vitamin (MULTIVITAMIN WITH MINERALS) TABS Take 1 tablet by mouth daily.      . Omega-3 Fatty Acids (FISH OIL) 300 MG CAPS Take 600 mg by mouth daily.        Marland Kitchen OVER THE COUNTER MEDICATION Take 1 capsule by mouth daily. Over the counter for joints-not glucosamine      . vitamin B-12 (CYANOCOBALAMIN) 1000 MCG tablet Take 1,000 mcg by mouth daily.       No current facility-administered medications on file prior to visit.    BP 130/90  Pulse 95  Temp(Src) 98.1 F (36.7 C) (Oral)  Ht 5\' 6"  (1.676 m)  Wt 180 lb (81.647 kg)  BMI 29.07 kg/m2  SpO2 98%     Review of Systems  Constitutional: Negative for fever, chills, appetite change and fatigue.  HENT: Negative for congestion, dental problem, ear pain, hearing loss, sore throat, tinnitus, trouble swallowing and voice change.   Eyes: Negative for pain, discharge and visual disturbance.  Respiratory: Negative for cough, chest tightness, wheezing and stridor.   Cardiovascular: Negative for chest pain, palpitations and leg swelling.  Gastrointestinal: Negative for nausea, vomiting, abdominal pain, diarrhea, constipation, blood in stool and abdominal distention.  Genitourinary: Negative for urgency, hematuria, flank pain, discharge, difficulty urinating and genital sores.  Musculoskeletal: Positive for back pain. Negative for arthralgias, gait problem, joint swelling, myalgias and neck stiffness.  Skin: Negative for rash.  Neurological: Positive  for numbness. Negative for dizziness, syncope, speech difficulty, weakness and headaches.  Hematological: Negative for adenopathy. Does not bruise/bleed easily.  Psychiatric/Behavioral: Negative for behavioral problems and dysphoric mood. The patient is not nervous/anxious.        Objective:   Physical Exam  Constitutional: He is oriented to person, place, and time. He appears well-developed and well-nourished. No distress.  Neurological: He is alert and oriented to person, place, and time. He has normal reflexes. No cranial nerve deficit. Coordination normal.  No motor weakness Biceps  reflexes normal and symmetrical Triceps reflexes a bit blunted  but equal bilaterally          Assessment & Plan:   C8 radiculopathy.  We'll treat with Depo-Medrol and observe.  He may require cervical CT scan if symptoms worsen or fail to improve

## 2013-08-18 NOTE — Patient Instructions (Signed)
Cervical Radiculopathy  Cervical radiculopathy happens when a nerve in the neck is pinched or bruised by a slipped (herniated) disk or by arthritic changes in the bones of the cervical spine. This can occur due to an injury or as part of the normal aging process. Pressure on the cervical nerves can cause pain or numbness that runs from your neck all the way down into your arm and fingers.  CAUSES   There are many possible causes, including:  · Injury.  · Muscle tightness in the neck from overuse.  · Swollen, painful joints (arthritis).  · Breakdown or degeneration in the bones and joints of the spine (spondylosis) due to aging.  · Bone spurs that may develop near the cervical nerves.  SYMPTOMS   Symptoms include pain, weakness, or numbness in the affected arm and hand. Pain can be severe or irritating. Symptoms may be worse when extending or turning the neck.  DIAGNOSIS   Your caregiver will ask about your symptoms and do a physical exam. He or she may test your strength and reflexes. X-rays, CT scans, and MRI scans may be needed in cases of injury or if the symptoms do not go away after a period of time. Electromyography (EMG) or nerve conduction testing may be done to study how your nerves and muscles are working.  TREATMENT   Your caregiver may recommend certain exercises to help relieve your symptoms. Cervical radiculopathy can, and often does, get better with time and treatment. If your problems continue, treatment options may include:  · Wearing a soft collar for short periods of time.  · Physical therapy to strengthen the neck muscles.  · Medicines, such as nonsteroidal anti-inflammatory drugs (NSAIDs), oral corticosteroids, or spinal injections.  · Surgery. Different types of surgery may be done depending on the cause of your problems.  HOME CARE INSTRUCTIONS   · Put ice on the affected area.  · Put ice in a plastic bag.  · Place a towel between your skin and the bag.  · Leave the ice on for 15-20 minutes,  03-04 times a day or as directed by your caregiver.  · If ice does not help, you can try using heat. Take a warm shower or bath, or use a hot water bottle as directed by your caregiver.  · You may try a gentle neck and shoulder massage.  · Use a flat pillow when you sleep.  · Only take over-the-counter or prescription medicines for pain, discomfort, or fever as directed by your caregiver.  · If physical therapy was prescribed, follow your caregiver's directions.  · If a soft collar was prescribed, use it as directed.  SEEK IMMEDIATE MEDICAL CARE IF:   · Your pain gets much worse and cannot be controlled with medicines.  · You have weakness or numbness in your hand, arm, face, or leg.  · You have a high fever or a stiff, rigid neck.  · You lose bowel or bladder control (incontinence).  · You have trouble with walking, balance, or speaking.  MAKE SURE YOU:   · Understand these instructions.  · Will watch your condition.  · Will get help right away if you are not doing well or get worse.  Document Released: 12/05/2000 Document Revised: 06/04/2011 Document Reviewed: 10/24/2010  [employer]® Patient Information ©2014 [employer].

## 2013-08-19 ENCOUNTER — Telehealth: Payer: Self-pay | Admitting: Internal Medicine

## 2013-08-19 NOTE — Telephone Encounter (Signed)
Relevant patient education mailed to patient.  

## 2013-12-10 ENCOUNTER — Telehealth: Payer: Self-pay | Admitting: Internal Medicine

## 2013-12-10 NOTE — Telephone Encounter (Signed)
Pt called to say that he moved and he is changing pharmacy. His new pharmacy will be   [facility] [address]

## 2013-12-10 NOTE — Telephone Encounter (Signed)
Pharmacy changed

## 2013-12-14 ENCOUNTER — Telehealth: Payer: Self-pay | Admitting: Internal Medicine

## 2013-12-14 NOTE — Telephone Encounter (Signed)
Pt request refill amLODipine-benazepril (LOTREL) 5-10 MG per capsule [facility]

## 2013-12-15 MED ORDER — AMLODIPINE BESY-BENAZEPRIL HCL 5-10 MG PO CAPS: 1.0000 | ORAL_CAPSULE | Freq: Every day | ORAL | Status: DC

## 2013-12-15 NOTE — Telephone Encounter (Signed)
Spoke to pt, told him Rx sent to pharmacy and told him physical due in November. Pt verbalized understanding.

## 2014-01-11 ENCOUNTER — Telehealth: Payer: Self-pay | Admitting: Internal Medicine

## 2014-01-11 ENCOUNTER — Other Ambulatory Visit: Payer: Self-pay | Admitting: Internal Medicine

## 2014-01-11 NOTE — Telephone Encounter (Signed)
Pt has been sch for 03/22/14

## 2014-01-11 NOTE — Telephone Encounter (Signed)
Yes

## 2014-01-11 NOTE — Telephone Encounter (Signed)
Pt would like cpx last wk in dec. Can I create 30 min slot?

## 2014-02-19 ENCOUNTER — Other Ambulatory Visit: Payer: Self-pay | Admitting: Internal Medicine

## 2014-03-21 ENCOUNTER — Other Ambulatory Visit: Payer: Self-pay | Admitting: Internal Medicine

## 2014-03-22 ENCOUNTER — Encounter: Payer: Self-pay | Admitting: Internal Medicine

## 2014-03-22 ENCOUNTER — Ambulatory Visit (INDEPENDENT_AMBULATORY_CARE_PROVIDER_SITE_OTHER): Payer: 59 | Admitting: Internal Medicine

## 2014-03-22 VITALS — BP 126/88 | HR 72 | Temp 98.0°F | Resp 20 | Ht 66.0 in | Wt 185.0 lb

## 2014-03-22 DIAGNOSIS — I10 Essential (primary) hypertension: Secondary | ICD-10-CM

## 2014-03-22 DIAGNOSIS — Z0000 Encounter for general adult medical examination without abnormal findings: Secondary | ICD-10-CM

## 2014-03-22 DIAGNOSIS — J309 Allergic rhinitis, unspecified: Secondary | ICD-10-CM

## 2014-03-22 LAB — CBC WITH DIFFERENTIAL/PLATELET
Basophils Absolute: 0 10*3/uL (ref 0.0–0.1)
Basophils Relative: 0.7 % (ref 0.0–3.0)
Eosinophils Absolute: 0.3 10*3/uL (ref 0.0–0.7)
Eosinophils Relative: 5.4 % — ABNORMAL HIGH (ref 0.0–5.0)
HCT: 54.2 % — ABNORMAL HIGH (ref 39.0–52.0)
Hemoglobin: 17.9 g/dL — ABNORMAL HIGH (ref 13.0–17.0)
Lymphocytes Relative: 28 % (ref 12.0–46.0)
Lymphs Abs: 1.5 10*3/uL (ref 0.7–4.0)
MCHC: 32.9 g/dL (ref 30.0–36.0)
MCV: 89.1 fl (ref 78.0–100.0)
Monocytes Absolute: 0.6 10*3/uL (ref 0.1–1.0)
Monocytes Relative: 10.5 % (ref 3.0–12.0)
Neutro Abs: 3 10*3/uL (ref 1.4–7.7)
Neutrophils Relative %: 55.4 % (ref 43.0–77.0)
Platelets: 206 10*3/uL (ref 150.0–400.0)
RBC: 6.08 Mil/uL — ABNORMAL HIGH (ref 4.22–5.81)
RDW: 13.3 % (ref 11.5–15.5)
WBC: 5.4 10*3/uL (ref 4.0–10.5)

## 2014-03-22 LAB — COMPREHENSIVE METABOLIC PANEL
ALT: 25 U/L (ref 0–53)
AST: 24 U/L (ref 0–37)
Albumin: 3.9 g/dL (ref 3.5–5.2)
Alkaline Phosphatase: 52 U/L (ref 39–117)
BILIRUBIN TOTAL: 0.9 mg/dL (ref 0.2–1.2)
BUN: 14 mg/dL (ref 6–23)
CO2: 29 mEq/L (ref 19–32)
CREATININE: 1.1 mg/dL (ref 0.4–1.5)
Calcium: 9.3 mg/dL (ref 8.4–10.5)
Chloride: 103 mEq/L (ref 96–112)
GFR: 89.54 mL/min (ref >60.00)
Glucose, Bld: 88 mg/dL (ref 70–99)
Potassium: 4.1 mEq/L (ref 3.5–5.1)
SODIUM: 137 meq/L (ref 135–145)
Total Protein: 6.9 g/dL (ref 6.0–8.3)

## 2014-03-22 LAB — PSA: PSA: 0.56 ng/mL (ref 0.10–4.00)

## 2014-03-22 LAB — LIPID PANEL
CHOLESTEROL: 177 mg/dL (ref 0–200)
HDL: 52.9 mg/dL (ref >39.00)
LDL CALC: 110 mg/dL — AB (ref 0–99)
NonHDL: 124.1
Total CHOL/HDL Ratio: 3
Triglycerides: 72 mg/dL (ref 0.0–149.0)
VLDL: 14.4 mg/dL (ref 0.0–40.0)

## 2014-03-22 LAB — TSH: TSH: 0.84 u[IU]/mL (ref 0.35–4.50)

## 2014-03-22 NOTE — Progress Notes (Signed)
Pre visit review using our clinic review tool, if applicable. No additional management support is needed unless otherwise documented below in the visit note. 

## 2014-03-22 NOTE — Patient Instructions (Signed)
Limit your sodium (Salt) intake  Please check your blood pressure on a regular basis.  If it is consistently greater than 150/90, please make an office appointment.    It is important that you exercise regularly, at least 20 minutes 3 to 4 times per week.  If you develop chest pain or shortness of breath seek  medical attention.  Health Maintenance A healthy lifestyle and preventative care can promote health and wellness.  Maintain regular health, dental, and eye exams.  Eat a healthy diet. Foods like vegetables, fruits, whole grains, low-fat dairy products, and lean protein foods contain the nutrients you need and are low in calories. Decrease your intake of foods high in solid fats, added sugars, and salt. Get information about a proper diet from your health care provider, if necessary.  Regular physical exercise is one of the most important things you can do for your health. Most adults should get at least 150 minutes of moderate-intensity exercise (any activity that increases your heart rate and causes you to sweat) each week. In addition, most adults need muscle-strengthening exercises on 2 or more days a week.   Maintain a healthy weight. The body mass index (BMI) is a screening tool to identify possible weight problems. It provides an estimate of body fat based on height and weight. Your health care provider can find your BMI and can help you achieve or maintain a healthy weight. For males 20 years and older:  A BMI below 18.5 is considered underweight.  A BMI of 18.5 to 24.9 is normal.  A BMI of 25 to 29.9 is considered overweight.  A BMI of 30 and above is considered obese.  Maintain normal blood lipids and cholesterol by exercising and minimizing your intake of saturated fat. Eat a balanced diet with plenty of fruits and vegetables. Blood tests for lipids and cholesterol should begin at age 33 and be repeated every 5 years. If your lipid or cholesterol levels are high, you are  over age 1, or you are at high risk for heart disease, you may need your cholesterol levels checked more frequently.Ongoing high lipid and cholesterol levels should be treated with medicines if diet and exercise are not working.  If you smoke, find out from your health care provider how to quit. If you do not use tobacco, do not start.  Lung cancer screening is recommended for adults aged 27-80 years who are at high risk for developing lung cancer because of a history of smoking. A yearly low-dose CT scan of the lungs is recommended for people who have at least a 30-pack-year history of smoking and are current smokers or have quit within the past 15 years. A pack year of smoking is smoking an average of 1 pack of cigarettes a day for 1 year (for example, a 30-pack-year history of smoking could mean smoking 1 pack a day for 30 years or 2 packs a day for 15 years). Yearly screening should continue until the smoker has stopped smoking for at least 15 years. Yearly screening should be stopped for people who develop a health problem that would prevent them from having lung cancer treatment.  If you choose to drink alcohol, do not have more than 2 drinks per day. One drink is considered to be 12 oz (360 mL) of beer, 5 oz (150 mL) of wine, or 1.5 oz (45 mL) of liquor.  Avoid the use of street drugs. Do not share needles with anyone. Ask for help if you  need support or instructions about stopping the use of drugs.  High blood pressure causes heart disease and increases the risk of stroke. Blood pressure should be checked at least every 1-2 years. Ongoing high blood pressure should be treated with medicines if weight loss and exercise are not effective.  If you are 71-23 years old, ask your health care provider if you should take aspirin to prevent heart disease.  Diabetes screening involves taking a blood sample to check your fasting blood sugar level. This should be done once every 3 years after age 68 if  you are at a normal weight and without risk factors for diabetes. Testing should be considered at a younger age or be carried out more frequently if you are overweight and have at least 1 risk factor for diabetes.  Colorectal cancer can be detected and often prevented. Most routine colorectal cancer screening begins at the age of 35 and continues through age 66. However, your health care provider may recommend screening at an earlier age if you have risk factors for colon cancer. On a yearly basis, your health care provider may provide home test kits to check for hidden blood in the stool. A small camera at the end of a tube may be used to directly examine the colon (sigmoidoscopy or colonoscopy) to detect the earliest forms of colorectal cancer. Talk to your health care provider about this at age 57 when routine screening begins. A direct exam of the colon should be repeated every 5-10 years through age 57, unless early forms of precancerous polyps or small growths are found.  People who are at an increased risk for hepatitis B should be screened for this virus. You are considered at high risk for hepatitis B if:  You were born in a country where hepatitis B occurs often. Talk with your health care provider about which countries are considered high risk.  Your parents were born in a high-risk country and you have not received a shot to protect against hepatitis B (hepatitis B vaccine).  You have HIV or AIDS.  You use needles to inject street drugs.  You live with, or have sex with, someone who has hepatitis B.  You are a man who has sex with other men (MSM).  You get hemodialysis treatment.  You take certain medicines for conditions like cancer, organ transplantation, and autoimmune conditions.  Hepatitis C blood testing is recommended for all people born from [birth year] through 1966 and any individual with known risk factors for hepatitis C.  Healthy men should no longer receive prostate-specific  antigen (PSA) blood tests as part of routine cancer screening. Talk to your health care provider about prostate cancer screening.  Testicular cancer screening is not recommended for adolescents or adult males who have no symptoms. Screening includes self-exam, a health care provider exam, and other screening tests. Consult with your health care provider about any symptoms you have or any concerns you have about testicular cancer.  Practice safe sex. Use condoms and avoid high-risk sexual practices to reduce the spread of sexually transmitted infections (STIs).  You should be screened for STIs, including gonorrhea and chlamydia if:  You are sexually active and are younger than 24 years.  You are older than 24 years, and your health care provider tells you that you are at risk for this type of infection.  Your sexual activity has changed since you were last screened, and you are at an increased risk for chlamydia or gonorrhea. Ask your health  care provider if you are at risk.  If you are at risk of being infected with HIV, it is recommended that you take a prescription medicine daily to prevent HIV infection. This is called pre-exposure prophylaxis (PrEP). You are considered at risk if:  You are a man who has sex with other men (MSM).  You are a heterosexual man who is sexually active with multiple partners.  You take drugs by injection.  You are sexually active with a partner who has HIV.  Talk with your health care provider about whether you are at high risk of being infected with HIV. If you choose to begin PrEP, you should first be tested for HIV. You should then be tested every 3 months for as long as you are taking PrEP.  Use sunscreen. Apply sunscreen liberally and repeatedly throughout the day. You should seek shade when your shadow is shorter than you. Protect yourself by wearing long sleeves, pants, a wide-brimmed hat, and sunglasses year round whenever you are outdoors.  Tell  your health care provider of new moles or changes in moles, especially if there is a change in shape or color. Also, tell your health care provider if a mole is larger than the size of a pencil eraser.  A one-time screening for abdominal aortic aneurysm (AAA) and surgical repair of large AAAs by ultrasound is recommended for men aged 23-75 years who are current or former smokers.  Stay current with your vaccines (immunizations). Document Released: 09/08/2007 Document Revised: 03/17/2013 Document Reviewed: 08/07/2010 [facility] Patient Information 2015 [place], [place]. This information is not intended to replace advice given to you by your health care provider. Make sure you discuss any questions you have with your health care provider.

## 2014-03-22 NOTE — Progress Notes (Signed)
Subjective:    Patient ID: Philip Cooley, male    DOB: 03/13/1966, 48 y.o.   MRN: [id number]  HPI 35 -year-old patient who has a history of hypertension as well as allergic rhinitis. He is seen today for health maintenance. He had a health screen at work that included a laboratory draw. No concerns or complaints. 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 He has had some modest weight loss over the past year;  he has treated hypertension which has been stable. His allergies have been stable No new concerns or complaints  Family history  Father died age 62.  HIV disease.  History of hypertension Mother age 19, history of asthma and hypertension Maternal grandmother with diabetes Maternal grandfather died of lung cancer 2 step brothers in good health except for obesity  Past Medical History  Diagnosis Date  . ACUTE GINGIVITIS NONPLAQUE INDUCED 09/07/2009  . ALLERGIC RHINITIS 06/17/2008  . HYPERTENSION 06/17/2008    History   Social History  . Marital Status: Married    Spouse Name: N/A    Number of Children: N/A  . Years of Education: N/A   Occupational History  . Not on file.   Social History Main Topics  . Smoking status: Never Smoker   . Smokeless tobacco: Never Used  . Alcohol Use: No  . Drug Use: No  . Sexual Activity: Not on file   Other Topics Concern  . Not on file   Social History Narrative    No past surgical history on file.  Family History  Problem Relation Age of Onset  . Diabetes Mother   . Cancer Mother     Allergies  Allergen Reactions  . Penicillins Swelling    Current Outpatient Prescriptions on File Prior to Visit  Medication Sig Dispense Refill  . amLODipine-benazepril (LOTREL) 5-10 MG per capsule TAKE 1 CAPSULE BY MOUTH DAILY. 90 capsule 1  . amLODipine-benazepril (LOTREL) 5-10 MG per capsule TAKE 1 CAPSULE BY MOUTH DAILY. 90 capsule 1  . cetirizine (ZYRTEC) 10 MG tablet Take 10 mg  by mouth daily.    . Multiple Vitamin (MULTIVITAMIN WITH MINERALS) TABS Take 1 tablet by mouth daily.    . Omega-3 Fatty Acids (FISH OIL) 300 MG CAPS Take 600 mg by mouth daily.    . traMADol (ULTRAM) 50 MG tablet Take 1 tablet (50 mg total) by mouth every 6 (six) hours as needed. 60 tablet 0  . vitamin B-12 (CYANOCOBALAMIN) 1000 MCG tablet Take 1,000 mcg by mouth daily.     No current facility-administered medications on file prior to visit.    BP 126/88 mmHg  Pulse 72  Temp(Src) 98 F (36.7 C) (Oral)  Resp 20  Ht 5\' 6"  (1.676 m)  Wt 185 lb (83.915 kg)  BMI 29.87 kg/m2  SpO2 98%       Review of Systems  Constitutional: Negative for fever, chills, appetite change and fatigue.  HENT: Negative for congestion, dental problem, ear pain, hearing loss, sore throat, tinnitus, trouble swallowing and voice change.   Eyes: Negative for pain, discharge and visual disturbance.  Respiratory: Negative for cough, chest tightness, wheezing and stridor.   Cardiovascular: Negative for chest pain, palpitations and leg swelling.  Gastrointestinal: Negative for nausea, vomiting, abdominal pain, diarrhea, constipation, blood in stool and abdominal distention.  Genitourinary: Negative for urgency, hematuria, flank pain, discharge, difficulty urinating and genital sores.  Musculoskeletal: Negative for myalgias, back pain, joint swelling, arthralgias, gait  problem and neck stiffness.  Skin: Negative for rash.  Neurological: Negative for dizziness, syncope, speech difficulty, weakness, numbness and headaches.  Hematological: Negative for adenopathy. Does not bruise/bleed easily.  Psychiatric/Behavioral: Negative for behavioral problems and dysphoric mood. The patient is not nervous/anxious.        Objective:   Physical Exam  Constitutional: He appears well-developed and well-nourished.  HENT:  Head: Normocephalic and atraumatic.  Right Ear: External ear normal.  Left Ear: External ear normal.   Nose: Nose normal.  Mouth/Throat: Oropharynx is clear and moist.  Eyes: Conjunctivae and EOM are normal. Pupils are equal, round, and reactive to light. No scleral icterus.  Neck: Normal range of motion. Neck supple. No JVD present. No thyromegaly present.  Cardiovascular: Regular rhythm, normal heart sounds and intact distal pulses.  Exam reveals no gallop and no friction rub.   No murmur heard. Pulmonary/Chest: Effort normal and breath sounds normal. He exhibits no tenderness.  Abdominal: Soft. Bowel sounds are normal. He exhibits no distension and no mass. There is no tenderness.  Genitourinary: Prostate normal and penis normal.  Musculoskeletal: Normal range of motion. He exhibits no edema or tenderness.  Lymphadenopathy:    He has no cervical adenopathy.  Neurological: He is alert. He has normal reflexes. No cranial nerve deficit. Coordination normal.  Skin: Skin is warm and dry. No rash noted.  Psychiatric: He has a normal mood and affect. His behavior is normal.          Assessment & Plan:   Health maintenance examination Hypertension stable History of allergic rhinitis stable   Low-salt diet recommended

## 2014-03-23 ENCOUNTER — Telehealth: Payer: Self-pay | Admitting: Internal Medicine

## 2014-03-23 NOTE — Telephone Encounter (Signed)
emmi emailed °

## 2014-04-14 ENCOUNTER — Other Ambulatory Visit: Payer: Self-pay | Admitting: *Deleted

## 2014-04-14 MED ORDER — AMLODIPINE BESY-BENAZEPRIL HCL 5-10 MG PO CAPS: 1.0000 | ORAL_CAPSULE | Freq: Every day | ORAL | Status: DC

## 2014-08-15 ENCOUNTER — Other Ambulatory Visit: Payer: Self-pay | Admitting: Internal Medicine

## 2015-02-11 ENCOUNTER — Other Ambulatory Visit: Payer: Self-pay | Admitting: Internal Medicine

## 2015-05-10 ENCOUNTER — Other Ambulatory Visit: Payer: Self-pay | Admitting: Internal Medicine

## 2015-08-07 ENCOUNTER — Other Ambulatory Visit: Payer: Self-pay | Admitting: Internal Medicine

## 2015-12-05 ENCOUNTER — Ambulatory Visit (INDEPENDENT_AMBULATORY_CARE_PROVIDER_SITE_OTHER): Payer: BLUE CROSS/BLUE SHIELD | Admitting: Internal Medicine

## 2015-12-05 ENCOUNTER — Encounter: Payer: Self-pay | Admitting: Internal Medicine

## 2015-12-05 ENCOUNTER — Encounter: Payer: Self-pay | Admitting: Gastroenterology

## 2015-12-05 VITALS — BP 128/80 | HR 72 | Temp 98.1°F | Resp 20 | Ht 65.0 in | Wt 186.5 lb

## 2015-12-05 DIAGNOSIS — Z23 Encounter for immunization: Secondary | ICD-10-CM | POA: Diagnosis not present

## 2015-12-05 DIAGNOSIS — Z0000 Encounter for general adult medical examination without abnormal findings: Secondary | ICD-10-CM | POA: Diagnosis not present

## 2015-12-05 LAB — COMPREHENSIVE METABOLIC PANEL
ALT: 25 U/L (ref 0–53)
AST: 21 U/L (ref 0–37)
Albumin: 3.9 g/dL (ref 3.5–5.2)
Alkaline Phosphatase: 53 U/L (ref 39–117)
BILIRUBIN TOTAL: 0.8 mg/dL (ref 0.2–1.2)
BUN: 14 mg/dL (ref 6–23)
CO2: 28 mEq/L (ref 19–32)
CREATININE: 1.02 mg/dL (ref 0.40–1.50)
Calcium: 8.9 mg/dL (ref 8.4–10.5)
Chloride: 103 mEq/L (ref 96–112)
GFR: 99.06 mL/min (ref >60.00)
GLUCOSE: 94 mg/dL (ref 70–99)
Potassium: 3.9 mEq/L (ref 3.5–5.1)
Sodium: 136 mEq/L (ref 135–145)
TOTAL PROTEIN: 6.6 g/dL (ref 6.0–8.3)

## 2015-12-05 LAB — LIPID PANEL
Cholesterol: 161 mg/dL (ref 0–200)
HDL: 54.9 mg/dL (ref >39.00)
LDL Cholesterol: 96 mg/dL (ref 0–99)
NONHDL: 105.67
TRIGLYCERIDES: 49 mg/dL (ref 0.0–149.0)
Total CHOL/HDL Ratio: 3
VLDL: 9.8 mg/dL (ref 0.0–40.0)

## 2015-12-05 LAB — CBC WITH DIFFERENTIAL/PLATELET
BASOS ABS: 0 10*3/uL (ref 0.0–0.1)
Basophils Relative: 0.7 % (ref 0.0–3.0)
EOS ABS: 0.3 10*3/uL (ref 0.0–0.7)
Eosinophils Relative: 5.3 % — ABNORMAL HIGH (ref 0.0–5.0)
HEMATOCRIT: 50.4 % (ref 39.0–52.0)
HEMOGLOBIN: 17.2 g/dL — AB (ref 13.0–17.0)
LYMPHS PCT: 31 % (ref 12.0–46.0)
Lymphs Abs: 1.6 10*3/uL (ref 0.7–4.0)
MCHC: 34 g/dL (ref 30.0–36.0)
MCV: 87.9 fl (ref 78.0–100.0)
MONOS PCT: 9.3 % (ref 3.0–12.0)
Monocytes Absolute: 0.5 10*3/uL (ref 0.1–1.0)
NEUTROS ABS: 2.8 10*3/uL (ref 1.4–7.7)
Neutrophils Relative %: 53.7 % (ref 43.0–77.0)
PLATELETS: 192 10*3/uL (ref 150.0–400.0)
RBC: 5.74 Mil/uL (ref 4.22–5.81)
RDW: 14.3 % (ref 11.5–15.5)
WBC: 5.2 10*3/uL (ref 4.0–10.5)

## 2015-12-05 LAB — TSH: TSH: 0.9 u[IU]/mL (ref 0.35–4.50)

## 2015-12-05 LAB — PSA: PSA: 0.78 ng/mL (ref 0.10–4.00)

## 2015-12-05 MED ORDER — AMLODIPINE BESY-BENAZEPRIL HCL 5-10 MG PO CAPS: ORAL_CAPSULE | ORAL | 3 refills | Status: DC

## 2015-12-05 NOTE — Patient Instructions (Addendum)
Limit your sodium (Salt) intake  Please check your blood pressure on a regular basis.  If it is consistently greater than 150/90, please make an office appointment.    It is important that you exercise regularly, at least 20 minutes 3 to 4 times per week.  If you develop chest pain or shortness of breath seek  medical attention.  Schedule your colonoscopy to help detect colon cancer.    Health Maintenance, Male A healthy lifestyle and preventative care can promote health and wellness.  Maintain regular health, dental, and eye exams.  Eat a healthy diet. Foods like vegetables, fruits, whole grains, low-fat dairy products, and lean protein foods contain the nutrients you need and are low in calories. Decrease your intake of foods high in solid fats, added sugars, and salt. Get information about a proper diet from your health care provider, if necessary.  Regular physical exercise is one of the most important things you can do for your health. Most adults should get at least 150 minutes of moderate-intensity exercise (any activity that increases your heart rate and causes you to sweat) each week. In addition, most adults need muscle-strengthening exercises on 2 or more days a week.   Maintain a healthy weight. The body mass index (BMI) is a screening tool to identify possible weight problems. It provides an estimate of body fat based on height and weight. Your health care provider can find your BMI and can help you achieve or maintain a healthy weight. For males 20 years and older:  A BMI below 18.5 is considered underweight.  A BMI of 18.5 to 24.9 is normal.  A BMI of 25 to 29.9 is considered overweight.  A BMI of 30 and above is considered obese.  Maintain normal blood lipids and cholesterol by exercising and minimizing your intake of saturated fat. Eat a balanced diet with plenty of fruits and vegetables. Blood tests for lipids and cholesterol should begin at age 80 and be repeated  every 5 years. If your lipid or cholesterol levels are high, you are over age 69, or you are at high risk for heart disease, you may need your cholesterol levels checked more frequently.Ongoing high lipid and cholesterol levels should be treated with medicines if diet and exercise are not working.  If you smoke, find out from your health care provider how to quit. If you do not use tobacco, do not start.  Lung cancer screening is recommended for adults aged 24-80 years who are at high risk for developing lung cancer because of a history of smoking. A yearly low-dose CT scan of the lungs is recommended for people who have at least a 30-pack-year history of smoking and are current smokers or have quit within the past 15 years. A pack year of smoking is smoking an average of 1 pack of cigarettes a day for 1 year (for example, a 30-pack-year history of smoking could mean smoking 1 pack a day for 30 years or 2 packs a day for 15 years). Yearly screening should continue until the smoker has stopped smoking for at least 15 years. Yearly screening should be stopped for people who develop a health problem that would prevent them from having lung cancer treatment.  If you choose to drink alcohol, do not have more than 2 drinks per day. One drink is considered to be 12 oz (360 mL) of beer, 5 oz (150 mL) of wine, or 1.5 oz (45 mL) of liquor.  Avoid the use of street  drugs. Do not share needles with anyone. Ask for help if you need support or instructions about stopping the use of drugs.  High blood pressure causes heart disease and increases the risk of stroke. High blood pressure is more likely to develop in:  People who have blood pressure in the end of the normal range (100-139/85-89 mm Hg).  People who are overweight or obese.  People who are African American.  If you are 43-21 years of age, have your blood pressure checked every 3-5 years. If you are 38 years of age or older, have your blood pressure  checked every year. You should have your blood pressure measured twice--once when you are at a hospital or clinic, and once when you are not at a hospital or clinic. Record the average of the two measurements. To check your blood pressure when you are not at a hospital or clinic, you can use:  An automated blood pressure machine at a pharmacy.  A home blood pressure monitor.  If you are 42-68 years old, ask your health care provider if you should take aspirin to prevent heart disease.  Diabetes screening involves taking a blood sample to check your fasting blood sugar level. This should be done once every 3 years after age 74 if you are at a normal weight and without risk factors for diabetes. Testing should be considered at a younger age or be carried out more frequently if you are overweight and have at least 1 risk factor for diabetes.  Colorectal cancer can be detected and often prevented. Most routine colorectal cancer screening begins at the age of 65 and continues through age 73. However, your health care provider may recommend screening at an earlier age if you have risk factors for colon cancer. On a yearly basis, your health care provider may provide home test kits to check for hidden blood in the stool. A small camera at the end of a tube may be used to directly examine the colon (sigmoidoscopy or colonoscopy) to detect the earliest forms of colorectal cancer. Talk to your health care provider about this at age 62 when routine screening begins. A direct exam of the colon should be repeated every 5-10 years through age 78, unless early forms of precancerous polyps or small growths are found.  People who are at an increased risk for hepatitis B should be screened for this virus. You are considered at high risk for hepatitis B if:  You were born in a country where hepatitis B occurs often. Talk with your health care provider about which countries are considered high risk.  Your parents were  born in a high-risk country and you have not received a shot to protect against hepatitis B (hepatitis B vaccine).  You have HIV or AIDS.  You use needles to inject street drugs.  You live with, or have sex with, someone who has hepatitis B.  You are a man who has sex with other men (MSM).  You get hemodialysis treatment.  You take certain medicines for conditions like cancer, organ transplantation, and autoimmune conditions.  Hepatitis C blood testing is recommended for all people born from [birth year] through 1966 and any individual with known risk factors for hepatitis C.  Healthy men should no longer receive prostate-specific antigen (PSA) blood tests as part of routine cancer screening. Talk to your health care provider about prostate cancer screening.  Testicular cancer screening is not recommended for adolescents or adult males who have no symptoms. Screening includes  self-exam, a health care provider exam, and other screening tests. Consult with your health care provider about any symptoms you have or any concerns you have about testicular cancer.  Practice safe sex. Use condoms and avoid high-risk sexual practices to reduce the spread of sexually transmitted infections (STIs).  You should be screened for STIs, including gonorrhea and chlamydia if:  You are sexually active and are younger than 24 years.  You are older than 24 years, and your health care provider tells you that you are at risk for this type of infection.  Your sexual activity has changed since you were last screened, and you are at an increased risk for chlamydia or gonorrhea. Ask your health care provider if you are at risk.  If you are at risk of being infected with HIV, it is recommended that you take a prescription medicine daily to prevent HIV infection. This is called pre-exposure prophylaxis (PrEP). You are considered at risk if:  You are a man who has sex with other men (MSM).  You are a heterosexual man who  is sexually active with multiple partners.  You take drugs by injection.  You are sexually active with a partner who has HIV.  Talk with your health care provider about whether you are at high risk of being infected with HIV. If you choose to begin PrEP, you should first be tested for HIV. You should then be tested every 3 months for as long as you are taking PrEP.  Use sunscreen. Apply sunscreen liberally and repeatedly throughout the day. You should seek shade when your shadow is shorter than you. Protect yourself by wearing long sleeves, pants, a wide-brimmed hat, and sunglasses year round whenever you are outdoors.  Tell your health care provider of new moles or changes in moles, especially if there is a change in shape or color. Also, tell your health care provider if a mole is larger than the size of a pencil eraser.  A one-time screening for abdominal aortic aneurysm (AAA) and surgical repair of large AAAs by ultrasound is recommended for men aged 61-75 years who are current or former smokers.  Stay current with your vaccines (immunizations).   This information is not intended to replace advice given to you by your health care provider. Make sure you discuss any questions you have with your health care provider.   Document Released: 09/08/2007 Document Revised: 04/02/2014 Document Reviewed: 08/07/2010 Elsevier Interactive Patient Education 2016 [employer] [facility] [employer] A sprain is an injury in which a ligament is torn. The ligaments of the lower back are vulnerable to sprains. However, they are strong and require great force to be injured. These ligaments are important for stabilizing the spinal column. Sprains are classified into three categories. Grade 1 sprains cause pain, but the tendon is not lengthened. Grade 2 sprains include a lengthened ligament, due to the ligament being stretched or partially ruptured. With grade 2 sprains there is still function, although  the function may be decreased. Grade 3 sprains involve a complete tear of the tendon or muscle, and function is usually impaired. SYMPTOMS   Severe pain in the lower back.  Sometimes, a feeling of a "pop," "snap," or tear, at the time of injury.  Tenderness and sometimes swelling at the injury site.  Uncommonly, bruising (contusion) within 48 hours of injury.  Muscle spasms in the back. CAUSES  Low back sprains occur when a force is placed on the ligaments that is greater  than they can handle. Common causes of injury include:  Performing a stressful act while off-balance.  Repetitive stressful activities that involve movement of the lower back.  Direct hit (trauma) to the lower back. RISK INCREASES WITH:  Contact sports (football, wrestling).  Collisions (major skiing accidents).  Sports that require throwing or lifting (baseball, weightlifting).  Sports involving twisting of the spine (gymnastics, diving, tennis, golf).  Poor strength and flexibility.  Inadequate protection.  Previous back injury or surgery (especially fusion). PREVENTION  Wear properly fitted and padded protective equipment.  Warm up and stretch properly before activity.  Allow for adequate recovery between workouts.  Maintain physical fitness:  Strength, flexibility, and endurance.  Cardiovascular fitness.  Maintain a healthy body weight. PROGNOSIS  If treated properly, low back sprains usually heal with non-surgical treatment. The length of time for healing depends on the severity of the injury.  RELATED COMPLICATIONS   Recurring symptoms, resulting in a chronic problem.  Chronic inflammation and pain in the low back.  Delayed healing or resolution of symptoms, especially if activity is resumed too soon.  Prolonged impairment.  Unstable or arthritic joints of the low back. TREATMENT  Treatment first involves the use of ice and medicine, to reduce pain and inflammation. The use of  strengthening and stretching exercises may help reduce pain with activity. These exercises may be performed at home or with a therapist. Severe injuries may require referral to a therapist for further evaluation and treatment, such as ultrasound. Your caregiver may advise that you wear a back brace or corset, to help reduce pain and discomfort. Often, prolonged bed rest results in greater harm then benefit. Corticosteroid injections may be recommended. However, these should be reserved for the most serious cases. It is important to avoid using your back when lifting objects. At night, sleep on your back on a firm mattress, with a pillow placed under your knees. If non-surgical treatment is unsuccessful, surgery may be needed.  MEDICATION   If pain medicine is needed, nonsteroidal anti-inflammatory medicines (aspirin and ibuprofen), or other minor pain relievers (acetaminophen), are often advised.  Do not take pain medicine for 7 days before surgery.  Prescription pain relievers may be given, if your caregiver thinks they are needed. Use only as directed and only as much as you need.  Ointments applied to the skin may be helpful.  Corticosteroid injections may be given by your caregiver. These injections should be reserved for the most serious cases, because they may only be given a certain number of times. HEAT AND COLD  Cold treatment (icing) should be applied for 10 to 15 minutes every 2 to 3 hours for inflammation and pain, and immediately after activity that aggravates your symptoms. Use ice packs or an ice massage.  Heat treatment may be used before performing stretching and strengthening activities prescribed by your caregiver, physical therapist, or athletic trainer. Use a heat pack or a warm water soak. SEEK MEDICAL CARE IF:   Symptoms get worse or do not improve in 2 to 4 weeks, despite treatment.  You develop numbness or weakness in either leg.  You lose bowel or bladder  function.  Any of the following occur after surgery: fever, increased pain, swelling, redness, drainage of fluids, or bleeding in the affected area.  New, unexplained symptoms develop. (Drugs used in treatment may produce side effects.) EXERCISES  RANGE OF MOTION (ROM) AND STRETCHING EXERCISES - Low Back Sprain Most people with lower back pain will find that their symptoms  get worse with excessive bending forward (flexion) or arching at the lower back (extension). The exercises that will help resolve your symptoms will focus on the opposite motion.  Your physician, physical therapist or athletic trainer will help you determine which exercises will be most helpful to resolve your lower back pain. Do not complete any exercises without first consulting with your caregiver. Discontinue any exercises which make your symptoms worse, until you speak to your caregiver. If you have pain, numbness or tingling which travels down into your buttocks, leg or foot, the goal of the therapy is for these symptoms to move closer to your back and eventually resolve. Sometimes, these leg symptoms will get better, but your lower back pain may worsen. This is often an indication of progress in your rehabilitation. Be very alert to any changes in your symptoms and the activities in which you participated in the 24 hours prior to the change. Sharing this information with your caregiver will allow him or her to most efficiently treat your condition. These exercises may help you when beginning to rehabilitate your injury. Your symptoms may resolve with or without further involvement from your physician, physical therapist or athletic trainer. While completing these exercises, remember:   Restoring tissue flexibility helps normal motion to return to the joints. This allows healthier, less painful movement and activity.  An effective stretch should be held for at least 30 seconds.  A stretch should never be painful. You should  only feel a gentle lengthening or release in the stretched tissue. FLEXION RANGE OF MOTION AND STRETCHING EXERCISES: STRETCH - Flexion, Single Knee to Chest   Lie on a firm bed or floor with both legs extended in front of you.  Keeping one leg in contact with the floor, bring your opposite knee to your chest. Hold your leg in place by either grabbing behind your thigh or at your knee.  Pull until you feel a gentle stretch in your low back. Hold __________ seconds.  Slowly release your grasp and repeat the exercise with the opposite side. Repeat __________ times. Complete this exercise __________ times per day.  STRETCH - Flexion, Double Knee to Chest  Lie on a firm bed or floor with both legs extended in front of you.  Keeping one leg in contact with the floor, bring your opposite knee to your chest.  Tense your stomach muscles to support your back and then lift your other knee to your chest. Hold your legs in place by either grabbing behind your thighs or at your knees.  Pull both knees toward your chest until you feel a gentle stretch in your low back. Hold __________ seconds.  Tense your stomach muscles and slowly return one leg at a time to the floor. Repeat __________ times. Complete this exercise __________ times per day.  STRETCH - Low Trunk Rotation  Lie on a firm bed or floor. Keeping your legs in front of you, bend your knees so they are both pointed toward the ceiling and your feet are flat on the floor.  Extend your arms out to the side. This will stabilize your upper body by keeping your shoulders in contact with the floor.  Gently and slowly drop both knees together to one side until you feel a gentle stretch in your low back. Hold for __________ seconds.  Tense your stomach muscles to support your lower back as you bring your knees back to the starting position. Repeat the exercise to the other side. Repeat __________ times.  Complete this exercise __________ times per  day  EXTENSION RANGE OF MOTION AND FLEXIBILITY EXERCISES: STRETCH - Extension, Prone on Elbows   Lie on your stomach on the floor, a bed will be too soft. Place your palms about shoulder width apart and at the height of your head.  Place your elbows under your shoulders. If this is too painful, stack pillows under your chest.  Allow your body to relax so that your hips drop lower and make contact more completely with the floor.  Hold this position for __________ seconds.  Slowly return to lying flat on the floor. Repeat __________ times. Complete this exercise __________ times per day.  RANGE OF MOTION - Extension, Prone Press Ups  Lie on your stomach on the floor, a bed will be too soft. Place your palms about shoulder width apart and at the height of your head.  Keeping your back as relaxed as possible, slowly straighten your elbows while keeping your hips on the floor. You may adjust the placement of your hands to maximize your comfort. As you gain motion, your hands will come more underneath your shoulders.  Hold this position __________ seconds.  Slowly return to lying flat on the floor. Repeat __________ times. Complete this exercise __________ times per day.  RANGE OF MOTION- Quadruped, Neutral Spine   Assume a hands and knees position on a firm surface. Keep your hands under your shoulders and your knees under your hips. You may place padding under your knees for comfort.  Drop your head and point your tailbone toward the ground below you. This will round out your lower back like an angry cat. Hold this position for __________ seconds.  Slowly lift your head and release your tail bone so that your back sags into a large arch, like an old horse.  Hold this position for __________ seconds.  Repeat this until you feel limber in your low back.  Now, find your "sweet spot." This will be the most comfortable position somewhere between the two previous positions. This is your  neutral spine. Once you have found this position, tense your stomach muscles to support your low back.  Hold this position for __________ seconds. Repeat __________ times. Complete this exercise __________ times per day.  STRENGTHENING EXERCISES - Low Back Sprain These exercises may help you when beginning to rehabilitate your injury. These exercises should be done near your "sweet spot." This is the neutral, low-back arch, somewhere between fully rounded and fully arched, that is your least painful position. When performed in this safe range of motion, these exercises can be used for people who have either a flexion or extension based injury. These exercises may resolve your symptoms with or without further involvement from your physician, physical therapist or athletic trainer. While completing these exercises, remember:   Muscles can gain both the endurance and the strength needed for everyday activities through controlled exercises.  Complete these exercises as instructed by your physician, physical therapist or athletic trainer. Increase the resistance and repetitions only as guided.  You may experience muscle soreness or fatigue, but the pain or discomfort you are trying to eliminate should never worsen during these exercises. If this pain does worsen, stop and make certain you are following the directions exactly. If the pain is still present after adjustments, discontinue the exercise until you can discuss the trouble with your caregiver. STRENGTHENING - Deep Abdominals, Pelvic Tilt   Lie on a firm bed or floor. Keeping your legs in front  of you, bend your knees so they are both pointed toward the ceiling and your feet are flat on the floor.  Tense your lower abdominal muscles to press your low back into the floor. This motion will rotate your pelvis so that your tail bone is scooping upwards rather than pointing at your feet or into the floor. With a gentle tension and even breathing, hold  this position for __________ seconds. Repeat __________ times. Complete this exercise __________ times per day.  STRENGTHENING - Abdominals, Crunches   Lie on a firm bed or floor. Keeping your legs in front of you, bend your knees so they are both pointed toward the ceiling and your feet are flat on the floor. Cross your arms over your chest.  Slightly tip your chin down without bending your neck.  Tense your abdominals and slowly lift your trunk high enough to just clear your shoulder blades. Lifting higher can put excessive stress on the lower back and does not further strengthen your abdominal muscles.  Control your return to the starting position. Repeat __________ times. Complete this exercise __________ times per day.  STRENGTHENING - Quadruped, Opposite UE/LE Lift   Assume a hands and knees position on a firm surface. Keep your hands under your shoulders and your knees under your hips. You may place padding under your knees for comfort.  Find your neutral spine and gently tense your abdominal muscles so that you can maintain this position. Your shoulders and hips should form a rectangle that is parallel with the floor and is not twisted.  Keeping your trunk steady, lift your right hand no higher than your shoulder and then your left leg no higher than your hip. Make sure you are not holding your breath. Hold this position for __________ seconds.  Continuing to keep your abdominal muscles tense and your back steady, slowly return to your starting position. Repeat with the opposite arm and leg. Repeat __________ times. Complete this exercise __________ times per day.  STRENGTHENING - Abdominals and Quadriceps, Straight Leg Raise   Lie on a firm bed or floor with both legs extended in front of you.  Keeping one leg in contact with the floor, bend the other knee so that your foot can rest flat on the floor.  Find your neutral spine, and tense your abdominal muscles to maintain your  spinal position throughout the exercise.  Slowly lift your straight leg off the floor about 6 inches for a count of 15, making sure to not hold your breath.  Still keeping your neutral spine, slowly lower your leg all the way to the floor. Repeat this exercise with each leg __________ times. Complete this exercise __________ times per day. POSTURE AND BODY MECHANICS CONSIDERATIONS - Low Back Sprain Keeping correct posture when sitting, standing or completing your activities will reduce the stress put on different body tissues, allowing injured tissues a chance to heal and limiting painful experiences. The following are general guidelines for improved posture. Your physician or physical therapist will provide you with any instructions specific to your needs. While reading these guidelines, remember:  The exercises prescribed by your provider will help you have the flexibility and strength to maintain correct postures.  The correct posture provides the best environment for your joints to work. All of your joints have less wear and tear when properly supported by a spine with good posture. This means you will experience a healthier, less painful body.  Correct posture must be practiced with all of  your activities, especially prolonged sitting and standing. Correct posture is as important when doing repetitive low-stress activities (typing) as it is when doing a single heavy-load activity (lifting). RESTING POSITIONS Consider which positions are most painful for you when choosing a resting position. If you have pain with flexion-based activities (sitting, bending, stooping, squatting), choose a position that allows you to rest in a less flexed posture. You would want to avoid curling into a fetal position on your side. If your pain worsens with extension-based activities (prolonged standing, working overhead), avoid resting in an extended position such as sleeping on your stomach. Most people will find  more comfort when they rest with their spine in a more neutral position, neither too rounded nor too arched. Lying on a non-sagging bed on your side with a pillow between your knees, or on your back with a pillow under your knees will often provide some relief. Keep in mind, being in any one position for a prolonged period of time, no matter how correct your posture, can still lead to stiffness. PROPER SITTING POSTURE In order to minimize stress and discomfort on your spine, you must sit with correct posture. Sitting with good posture should be effortless for a healthy body. Returning to good posture is a gradual process. Many people can work toward this most comfortably by using various supports until they have the flexibility and strength to maintain this posture on their own. When sitting with proper posture, your ears will fall over your shoulders and your shoulders will fall over your hips. You should use the back of the chair to support your upper back. Your lower back will be in a neutral position, just slightly arched. You may place a small pillow or folded towel at the base of your lower back for  support.  When working at a desk, create an environment that supports good, upright posture. Without extra support, muscles tire, which leads to excessive strain on joints and other tissues. Keep these recommendations in mind: CHAIR:  A chair should be able to slide under your desk when your back makes contact with the back of the chair. This allows you to work closely.  The chair's height should allow your eyes to be level with the upper part of your monitor and your hands to be slightly lower than your elbows. BODY POSITION  Your feet should make contact with the floor. If this is not possible, use a foot rest.  Keep your ears over your shoulders. This will reduce stress on your neck and low back. INCORRECT SITTING POSTURES  If you are feeling tired and unable to assume a healthy sitting  posture, do not slouch or slump. This puts excessive strain on your back tissues, causing more damage and pain. Healthier options include:  Using more support, like a lumbar pillow.  Switching tasks to something that requires you to be upright or walking.  Talking a brief walk.  Lying down to rest in a neutral-spine position. PROLONGED STANDING WHILE SLIGHTLY LEANING FORWARD  When completing a task that requires you to lean forward while standing in one place for a long time, place either foot up on a stationary 2-4 inch high object to help maintain the best posture. When both feet are on the ground, the lower back tends to lose its slight inward curve. If this curve flattens (or becomes too large), then the back and your other joints will experience too much stress, tire more quickly, and can cause pain. CORRECT STANDING  POSTURES Proper standing posture should be assumed with all daily activities, even if they only take a few moments, like when brushing your teeth. As in sitting, your ears should fall over your shoulders and your shoulders should fall over your hips. You should keep a slight tension in your abdominal muscles to brace your spine. Your tailbone should point down to the ground, not behind your body, resulting in an over-extended swayback posture.  INCORRECT STANDING POSTURES  Common incorrect standing postures include a forward head, locked knees and/or an excessive swayback. WALKING Walk with an upright posture. Your ears, shoulders and hips should all line-up. PROLONGED ACTIVITY IN A FLEXED POSITION When completing a task that requires you to bend forward at your waist or lean over a low surface, try to find a way to stabilize 3 out of 4 of your limbs. You can place a hand or elbow on your thigh or rest a knee on the surface you are reaching across. This will provide you more stability, so that your muscles do not tire as quickly. By keeping your knees relaxed, or slightly bent,  you will also reduce stress across your lower back. CORRECT LIFTING TECHNIQUES DO :  Assume a wide stance. This will provide you more stability and the opportunity to get as close as possible to the object which you are lifting.  Tense your abdominals to brace your spine. Bend at the knees and hips. Keeping your back locked in a neutral-spine position, lift using your leg muscles. Lift with your legs, keeping your back straight.  Test the weight of unknown objects before attempting to lift them.  Try to keep your elbows locked down at your sides in order get the best strength from your shoulders when carrying an object.  Always ask for help when lifting heavy or awkward objects. INCORRECT LIFTING TECHNIQUES DO NOT:   Lock your knees when lifting, even if it is a small object.  Bend and twist. Pivot at your feet or move your feet when needing to change directions.  Assume that you can safely pick up even a paperclip without proper posture.   This information is not intended to replace advice given to you by your health care provider. Make sure you discuss any questions you have with your health care provider.   Document Released: 03/12/2005 Document Revised: 04/02/2014 Document Reviewed: 06/24/2008 Elsevier Interactive Patient Education [employer].

## 2015-12-05 NOTE — Progress Notes (Signed)
Subjective:    Patient ID: Philip Cooley, male    DOB: 06/21/1965, 50 y.o.   MRN: [id number]  HPI  50 year old patient seen today for a preventive health exam. Medical problems include essential hypertension.  Blood pressures have been well controlled with home blood pressure monitoring For the past few weeks she has had some low back pain that has improved.  Otherwise no concerns or complaints  Family history  Father died age 51.  HIV disease.  History of hypertension Mother age 48, history of asthma and hypertension Maternal grandmother with diabetes Maternal grandfather died of lung cancer 2 step brothers in good health except for obesity  Past Medical History:  Diagnosis Date  . ACUTE GINGIVITIS NONPLAQUE INDUCED 09/07/2009  . ALLERGIC RHINITIS 06/17/2008  . HYPERTENSION 06/17/2008     Social History   Social History  . Marital status: Married    Spouse name: N/A  . Number of children: N/A  . Years of education: N/A   Occupational History  . Not on file.   Social History Main Topics  . Smoking status: Never Smoker  . Smokeless tobacco: Never Used  . Alcohol use No  . Drug use: No  . Sexual activity: Not on file   Other Topics Concern  . Not on file   Social History Narrative  . No narrative on file    No past surgical history on file.  Family History  Problem Relation Age of Onset  . Diabetes Mother   . Cancer Mother     Allergies  Allergen Reactions  . Penicillins Swelling    Current Outpatient Prescriptions on File Prior to Visit  Medication Sig Dispense Refill  . cetirizine (ZYRTEC) 10 MG tablet Take 10 mg by mouth daily.    . Multiple Vitamin (MULTIVITAMIN WITH MINERALS) TABS Take 1 tablet by mouth daily.    . Omega-3 Fatty Acids (FISH OIL) 300 MG CAPS Take 600 mg by mouth daily.     No current facility-administered medications on file prior to visit.     BP 128/80 (BP Location: Left Arm, Patient Position: Sitting, Cuff Size: Normal)    Pulse 72   Temp 98.1 F (36.7 C) (Oral)   Resp 20   Ht 5\' 5"  (1.651 m)   Wt 186 lb 8 oz (84.6 kg)   SpO2 98%   BMI 31.04 kg/m    Review of Systems  Constitutional: Negative for activity change, appetite change, chills, fatigue and fever.  HENT: Negative for congestion, dental problem, ear pain, hearing loss, mouth sores, rhinorrhea, sinus pressure, sneezing, tinnitus, trouble swallowing and voice change.   Eyes: Negative for photophobia, pain, redness and visual disturbance.  Respiratory: Negative for apnea, cough, choking, chest tightness, shortness of breath and wheezing.   Cardiovascular: Negative for chest pain, palpitations and leg swelling.  Gastrointestinal: Negative for abdominal distention, abdominal pain, anal bleeding, blood in stool, constipation, diarrhea, nausea, rectal pain and vomiting.  Genitourinary: Negative for decreased urine volume, difficulty urinating, discharge, dysuria, flank pain, frequency, genital sores, hematuria, penile swelling, scrotal swelling, testicular pain and urgency.  Musculoskeletal: Positive for back pain. Negative for arthralgias, gait problem, joint swelling, myalgias, neck pain and neck stiffness.  Skin: Negative for color change, rash and wound.  Neurological: Negative for dizziness, tremors, seizures, syncope, facial asymmetry, speech difficulty, weakness, light-headedness, numbness and headaches.  Hematological: Negative for adenopathy. Does not bruise/bleed easily.  Psychiatric/Behavioral: Negative for agitation, behavioral problems, confusion, decreased concentration, dysphoric mood, hallucinations, self-injury, sleep disturbance and  suicidal ideas. The patient is not nervous/anxious.        Objective:   Physical Exam  Constitutional: He appears well-developed and well-nourished.  HENT:  Head: Normocephalic and atraumatic.  Right Ear: External ear normal.  Left Ear: External ear normal.  Nose: Nose normal.  Mouth/Throat: Oropharynx  is clear and moist.  Eyes: Conjunctivae and EOM are normal. Pupils are equal, round, and reactive to light. No scleral icterus.  Neck: Normal range of motion. Neck supple. No JVD present. No thyromegaly present.  Cardiovascular: Regular rhythm, normal heart sounds and intact distal pulses.  Exam reveals no gallop and no friction rub.   No murmur heard. Pulmonary/Chest: Effort normal and breath sounds normal. He exhibits no tenderness.  Abdominal: Soft. Bowel sounds are normal. He exhibits no distension and no mass. There is no tenderness.  Genitourinary: Prostate normal and penis normal. Rectal exam shows guaiac negative stool.  Musculoskeletal: Normal range of motion. He exhibits no edema or tenderness.  Lymphadenopathy:    He has no cervical adenopathy.  Neurological: He is alert. He has normal reflexes. No cranial nerve deficit. Coordination normal.  Skin: Skin is warm and dry. No rash noted.  Psychiatric: He has a normal mood and affect. His behavior is normal.          Assessment & Plan:   Preventive health exam Essential hypertension, stable  We'll set up for initial screening colonoscopy Check screening lab Continue home blood pressure monitoring Recheck one year or as needed Medications updated  Nyoka Cowden

## 2015-12-05 NOTE — Progress Notes (Signed)
Pre visit review using our clinic review tool, if applicable. No additional management support is needed unless otherwise documented below in the visit note. 

## 2015-12-21 ENCOUNTER — Encounter: Payer: Self-pay | Admitting: Gastroenterology

## 2015-12-21 ENCOUNTER — Ambulatory Visit (AMBULATORY_SURGERY_CENTER): Payer: Self-pay | Admitting: *Deleted

## 2015-12-21 VITALS — Ht 65.0 in | Wt 181.0 lb

## 2015-12-21 DIAGNOSIS — Z1211 Encounter for screening for malignant neoplasm of colon: Secondary | ICD-10-CM

## 2015-12-21 MED ORDER — NA SULFATE-K SULFATE-MG SULF 17.5-3.13-1.6 GM/177ML PO SOLN: ORAL | 0 refills | Status: DC

## 2015-12-21 NOTE — Progress Notes (Signed)
Patient denies any allergies to eggs or soy. Patient denies any problems with anesthesia/sedation. Patient denies any oxygen use at home and does not take any diet/weight loss medications. EMMI education assisgned to patient on colonoscopy, this was explained and instructions given to patient. 

## 2016-01-04 ENCOUNTER — Encounter: Payer: Self-pay | Admitting: Gastroenterology

## 2016-01-04 ENCOUNTER — Ambulatory Visit (AMBULATORY_SURGERY_CENTER): Payer: BLUE CROSS/BLUE SHIELD | Admitting: Gastroenterology

## 2016-01-04 VITALS — BP 117/71 | HR 62 | Temp 98.4°F | Resp 13 | Ht 65.0 in | Wt 181.0 lb

## 2016-01-04 DIAGNOSIS — Z1212 Encounter for screening for malignant neoplasm of rectum: Secondary | ICD-10-CM | POA: Diagnosis not present

## 2016-01-04 DIAGNOSIS — Z1211 Encounter for screening for malignant neoplasm of colon: Secondary | ICD-10-CM

## 2016-01-04 DIAGNOSIS — D123 Benign neoplasm of transverse colon: Secondary | ICD-10-CM

## 2016-01-04 MED ORDER — SODIUM CHLORIDE 0.9 % IV SOLN: 500.0000 mL | INTRAVENOUS | Status: DC

## 2016-01-04 NOTE — Patient Instructions (Signed)
Colon polyp removed today. Result letter in your mail in 2-3 weeks. Handouts given on polyps, diverticulosis, hemorrhoids, and high fiber diet. Resume current medications. Call us with any questions or concerns. Thank you!   YOU HAD AN ENDOSCOPIC PROCEDURE TODAY AT [facility]:   Refer to the procedure report that was given to you for any specific questions about what was found during the examination.  If the procedure report does not answer your questions, please call your gastroenterologist to clarify.  If you requested that your care partner not be given the details of your procedure findings, then the procedure report has been included in a sealed envelope for you to review at your convenience later.  YOU SHOULD EXPECT: Some feelings of bloating in the abdomen. Passage of more gas than usual.  Walking can help get rid of the air that was put into your GI tract during the procedure and reduce the bloating. If you had a lower endoscopy (such as a colonoscopy or flexible sigmoidoscopy) you may notice spotting of blood in your stool or on the toilet paper. If you underwent a bowel prep for your procedure, you may not have a normal bowel movement for a few days.  Please Note:  You might notice some irritation and congestion in your nose or some drainage.  This is from the oxygen used during your procedure.  There is no need for concern and it should clear up in a day or so.  SYMPTOMS TO REPORT IMMEDIATELY:   Following lower endoscopy (colonoscopy or flexible sigmoidoscopy):  Excessive amounts of blood in the stool  Significant tenderness or worsening of abdominal pains  Swelling of the abdomen that is new, acute  Fever of 100F or higher   For urgent or emergent issues, a gastroenterologist can be reached at any hour by calling [phone].   DIET:  We do recommend a small meal at first, but then you may proceed to your regular diet.  Drink plenty of fluids but you should  avoid alcoholic beverages for 24 hours.  ACTIVITY:  You should plan to take it easy for the rest of today and you should NOT DRIVE or use heavy machinery until tomorrow (because of the sedation medicines used during the test).    FOLLOW UP: Our staff will call the number listed on your records the next business day following your procedure to check on you and address any questions or concerns that you may have regarding the information given to you following your procedure. If we do not reach you, we will leave a message.  However, if you are feeling well and you are not experiencing any problems, there is no need to return our call.  We will assume that you have returned to your regular daily activities without incident.  If any biopsies were taken you will be contacted by phone or by letter within the next 1-3 weeks.  Please call us at [phone] if you have not heard about the biopsies in 3 weeks.    SIGNATURES/CONFIDENTIALITY: You and/or your care partner have signed paperwork which will be entered into your electronic medical record.  These signatures attest to the fact that that the information above on your After Visit Summary has been reviewed and is understood.  Full responsibility of the confidentiality of this discharge information lies with you and/or your care-partner.

## 2016-01-04 NOTE — Progress Notes (Signed)
  [facility] Post-op Note  Patient: Philip Cooley  Procedure(s) Performed: colonoscopy  Patient Location: [facility] - Recovery Area  Anesthesia Type: Deep Sedation/Propofol  Level of Consciousness: awake, oriented and patient cooperative  Airway and Oxygen Therapy: Patient Spontanous Breathing  Post-op Pain: none  Post-op Assessment:  Post-op Vital signs reviewed, Patient's Cardiovascular Status Stable, Respiratory Function Stable, Patent Airway, No signs of Nausea or vomiting and Pain level controlled  Post-op Vital Signs: Reviewed and stable  Complications: No apparent anesthesia complications  Iridessa Harrow E 11:23 AM

## 2016-01-04 NOTE — Op Note (Signed)
[employer] Patient Name: Philip Cooley Procedure Date: 01/04/2016 10:56 AM MRN: [id number] Endoscopist: Ladene Artist , MD Age: 50 Referring MD:  Date of Birth: July 05, 1965 Gender: Male Account #: [id number] Procedure:                Colonoscopy Indications:              Screening for colorectal malignant neoplasm Medicines:                Monitored Anesthesia Care Procedure:                Pre-Anesthesia Assessment:                           - Prior to the procedure, a History and Physical                            was performed, and patient medications and                            allergies were reviewed. The patient's tolerance of                            previous anesthesia was also reviewed. The risks                            and benefits of the procedure and the sedation                            options and risks were discussed with the patient.                            All questions were answered, and informed consent                            was obtained. Prior Anticoagulants: The patient has                            taken no previous anticoagulant or antiplatelet                            agents. ASA Grade Assessment: II - A patient with                            mild systemic disease. After reviewing the risks                            and benefits, the patient was deemed in                            satisfactory condition to undergo the procedure.                           After obtaining informed consent, the colonoscope  was passed under direct vision. Throughout the                            procedure, the patient's blood pressure, pulse, and                            oxygen saturations were monitored continuously. The                            Model [id number]-[id number]) scope was introduced                            through the anus and advanced to the the cecum,                            identified by  appendiceal orifice and ileocecal                            valve. The ileocecal valve, appendiceal orifice,                            and rectum were photographed. The quality of the                            bowel preparation was good. The colonoscopy was                            performed without difficulty. The patient tolerated                            the procedure well. Scope In: 11:08:15 AM Scope Out: 11:18:36 AM Scope Withdrawal Time: 0 hours 8 minutes 36 seconds  Total Procedure Duration: 0 hours 10 minutes 21 seconds  Findings:                 The perianal and digital rectal examinations were                            normal.                           A 5 mm polyp was found in the transverse colon. The                            polyp was sessile. The polyp was removed with a                            cold biopsy forceps. Resection and retrieval were                            complete.                           Internal hemorrhoids were found during  retroflexion. The hemorrhoids were small and Grade                            I (internal hemorrhoids that do not prolapse).                           Multiple small-mouthed diverticula were found in                            the sigmoid colon. There was narrowing of the colon                            in association with the diverticular opening. There                            was evidence of diverticular spasm. Erythema was                            seen in association with the diverticular opening.                            There was no evidence of diverticular bleeding.                           The exam was otherwise without abnormality on                            direct and retroflexion views. Complications:            No immediate complications. Estimated blood loss:                            None. Estimated Blood Loss:     Estimated blood loss: none. Impression:               -  One 5 mm polyp in the transverse colon, removed                            with a cold biopsy forceps. Resected and retrieved.                           - Internal hemorrhoids.                           - Mild diverticulosis in the sigmoid colon.                           - The examination was otherwise normal on direct                            and retroflexion views. Recommendation:           - Repeat colonoscopy in 5 years for surveillance if  polyp is precancerous, otherwise 10 years for                            screening.                           - Patient has a contact number available for                            emergencies. The signs and symptoms of potential                            delayed complications were discussed with the                            patient. Return to normal activities tomorrow.                            Written discharge instructions were provided to the                            patient.                           - Continue present medications.                           - Await pathology results.                           - High fiber diet indefinitely. Ladene Artist, MD 01/04/2016 11:22:08 AM This report has been signed electronically.

## 2016-01-04 NOTE — Progress Notes (Signed)
Called to room to assist during endoscopic procedure.  Patient ID and intended procedure confirmed with present staff. Received instructions for my participation in the procedure from the performing physician.  

## 2016-01-05 ENCOUNTER — Telehealth: Payer: Self-pay | Admitting: *Deleted

## 2016-01-05 NOTE — Telephone Encounter (Signed)
  Follow up Call-  Call back number 01/04/2016  Post procedure Call Back phone  # [phone]  Permission to leave phone message Yes  Some recent data might be hidden     Patient questions:  Do you have a fever, pain , or abdominal swelling? No. Pain Score  0 *  Have you tolerated food without any problems? Yes.    Have you been able to return to your normal activities? Yes.    Do you have any questions about your discharge instructions: Diet   No. Medications  No. Follow up visit  No.  Do you have questions or concerns about your Care? No.  Actions: * If pain score is 4 or above: No action needed, pain <4.

## 2016-01-16 NOTE — Telephone Encounter (Signed)
Tel note opened in error. maw

## 2016-01-17 ENCOUNTER — Encounter: Payer: Self-pay | Admitting: Gastroenterology

## 2016-07-20 ENCOUNTER — Encounter: Payer: Self-pay | Admitting: Internal Medicine

## 2016-07-20 ENCOUNTER — Ambulatory Visit (INDEPENDENT_AMBULATORY_CARE_PROVIDER_SITE_OTHER): Payer: BLUE CROSS/BLUE SHIELD | Admitting: Internal Medicine

## 2016-07-20 VITALS — BP 132/76 | HR 70 | Temp 98.5°F | Ht 65.0 in | Wt 183.0 lb

## 2016-07-20 DIAGNOSIS — I10 Essential (primary) hypertension: Secondary | ICD-10-CM

## 2016-07-20 DIAGNOSIS — R2231 Localized swelling, mass and lump, right upper limb: Secondary | ICD-10-CM | POA: Diagnosis not present

## 2016-07-20 NOTE — Progress Notes (Signed)
Subjective:    Patient ID: Philip Cooley, male    DOB: 08/22/1965, 51 y.o.   MRN: [id number]  HPI  51 year old patient who has essential hypertension He presents with a 2 to three-week history of pain in both legs.  The discomfort involving the left leg has larger resolved.  He describes some discomfort just proximal to the right knee area.  He states he frequently leans with his elbows over this area throughout the day. 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  He also has a nodule involving the right fifth finger that has been enlarging over several months  Past Medical History:  Diagnosis Date  . ACUTE GINGIVITIS NONPLAQUE INDUCED 09/07/2009  . ALLERGIC RHINITIS 06/17/2008  . HYPERTENSION 06/17/2008     Social History   Social History  . Marital status: Married    Spouse name: N/A  . Number of children: N/A  . Years of education: N/A   Occupational History  . Not on file.   Social History Main Topics  . Smoking status: Never Smoker  . Smokeless tobacco: Never Used  . Alcohol use No  . Drug use: No  . Sexual activity: Not on file   Other Topics Concern  . Not on file   Social History Narrative  . No narrative on file    Past Surgical History:  Procedure Laterality Date  . FINGER FRACTURE SURGERY      Family History  Problem Relation Age of Onset  . Diabetes Mother   . Cancer Mother   . Colon cancer Neg Hx     Allergies  Allergen Reactions  . Penicillins Swelling    Current Outpatient Prescriptions on File Prior to Visit  Medication Sig Dispense Refill  . amLODipine-benazepril (LOTREL) 5-10 MG capsule TAKE 1 CAPSULE DAILY 90 capsule 3  . cetirizine (ZYRTEC) 10 MG tablet Take 10 mg by mouth daily.    . Multiple Vitamin (MULTIVITAMIN WITH MINERALS) TABS Take 1 tablet by mouth daily.    . Omega-3 Fatty Acids (FISH OIL) 300 MG CAPS Take 600 mg by mouth daily.     Current  Facility-Administered Medications on File Prior to Visit  Medication Dose Route Frequency Provider Last Rate Last Dose  . 0.9 %  sodium chloride infusion  500 mL Intravenous Continuous Ladene Artist, MD        BP 132/76 (BP Location: Left Arm, Patient Position: Sitting, Cuff Size: Normal)   Pulse 70   Temp 98.5 F (36.9 C) (Oral)   Ht 5\' 5"  (1.651 m)   Wt 183 lb (83 kg)   SpO2 99%   BMI 30.45 kg/m     Review of Systems  Constitutional: Negative for appetite change, chills, fatigue and fever.  HENT: Negative for congestion, dental problem, ear pain, hearing loss, sore throat, tinnitus, trouble swallowing and voice change.   Eyes: Negative for pain, discharge and visual disturbance.  Respiratory: Negative for cough, chest tightness, wheezing and stridor.   Cardiovascular: Negative for chest pain, palpitations and leg swelling.  Gastrointestinal: Negative for abdominal distention, abdominal pain, blood in stool, constipation, diarrhea, nausea and vomiting.  Genitourinary: Negative for difficulty urinating, discharge, flank pain, genital sores, hematuria and urgency.  Musculoskeletal: Negative for arthralgias, back pain, gait problem, joint swelling, myalgias and neck stiffness.       Right anterior leg discomfort  Skin: Negative for rash.  Neurological: Negative for dizziness, syncope, speech difficulty, weakness, numbness  and headaches.  Hematological: Negative for adenopathy. Does not bruise/bleed easily.  Psychiatric/Behavioral: Negative for behavioral problems and dysphoric mood. The patient is not nervous/anxious.        Objective:   Physical Exam  Constitutional: He appears well-developed and well-nourished. No distress.  Blood pressure well controlled  Musculoskeletal:  1 cm firm poorly movable nodule involving the right fifth finger, dorsal aspect between the PIP and DIP joint  Some mild tenderness involving the anterior right leg just proximal to the knee.  There is  suggestion of a small subcutaneous nodule that was slightly tender          Assessment & Plan:   Enlarging nodule, right fifth finger.  Will set up for evaluation by hand surgery Right anterior leg pain.  Doubt serious pathology.  Patient may have a small lipoma or sebaceous cyst that he aggravates with local pressure.  Will tend to minimize trauma and observe at this point.  Will call the office if pain continues or worsens  CPX 6 months  [id number],PETER Pilar Plate

## 2016-07-20 NOTE — Progress Notes (Signed)
Pre visit review using our clinic review tool, if applicable. No additional management support is needed unless otherwise documented below in the visit note. 

## 2016-07-20 NOTE — Patient Instructions (Addendum)
WE NOW OFFER   [facility] FAST TRACK!!!  SAME DAY Appointments for ACUTE CARE  Such as: Sprains, Injuries, cuts, abrasions, rashes, muscle pain, joint pain, back pain Colds, flu, sore throats, headache, allergies, cough, fever  Ear pain, sinus and eye infections Abdominal pain, nausea, vomiting, diarrhea, upset stomach Animal/insect bites  3 Easy Ways to Schedule: Walk-In Scheduling Call in scheduling Mychart Sign-up: [email or web address]    Orthopedic referral as discussed  Call or return to clinic prn if these symptoms worsen or fail to improve as anticipated.  Limit your sodium (Salt) intake  Please check your blood pressure on a regular basis.  If it is consistently greater than 150/90, please make an office appointment.  Return in 6 months for follow-up

## 2016-08-10 ENCOUNTER — Other Ambulatory Visit: Payer: Self-pay | Admitting: Orthopedic Surgery

## 2016-08-13 ENCOUNTER — Other Ambulatory Visit: Payer: Self-pay

## 2016-08-13 ENCOUNTER — Encounter (HOSPITAL_BASED_OUTPATIENT_CLINIC_OR_DEPARTMENT_OTHER): Payer: Self-pay | Admitting: *Deleted

## 2016-08-13 ENCOUNTER — Encounter (HOSPITAL_BASED_OUTPATIENT_CLINIC_OR_DEPARTMENT_OTHER)
Admission: RE | Admit: 2016-08-13 | Discharge: 2016-08-13 | Disposition: A | Discharge status: Home / Self Care | Payer: BLUE CROSS/BLUE SHIELD | Source: Ambulatory Visit | Attending: Orthopedic Surgery | Admitting: Orthopedic Surgery

## 2016-08-13 DIAGNOSIS — Z880 Allergy status to penicillin: Secondary | ICD-10-CM | POA: Diagnosis not present

## 2016-08-13 DIAGNOSIS — R2231 Localized swelling, mass and lump, right upper limb: Secondary | ICD-10-CM | POA: Diagnosis present

## 2016-08-13 DIAGNOSIS — J309 Allergic rhinitis, unspecified: Secondary | ICD-10-CM | POA: Diagnosis not present

## 2016-08-13 DIAGNOSIS — D2111 Benign neoplasm of connective and other soft tissue of right upper limb, including shoulder: Secondary | ICD-10-CM | POA: Diagnosis not present

## 2016-08-13 DIAGNOSIS — I10 Essential (primary) hypertension: Secondary | ICD-10-CM | POA: Diagnosis not present

## 2016-08-13 DIAGNOSIS — Z79899 Other long term (current) drug therapy: Secondary | ICD-10-CM | POA: Diagnosis not present

## 2016-08-14 ENCOUNTER — Other Ambulatory Visit: Payer: Self-pay | Admitting: Orthopedic Surgery

## 2016-08-14 ENCOUNTER — Encounter (HOSPITAL_BASED_OUTPATIENT_CLINIC_OR_DEPARTMENT_OTHER): Payer: Self-pay | Admitting: Anesthesiology

## 2016-08-14 ENCOUNTER — Encounter (HOSPITAL_BASED_OUTPATIENT_CLINIC_OR_DEPARTMENT_OTHER)
Admission: RE | Disposition: A | Discharge status: Home / Self Care | Payer: Self-pay | Source: Ambulatory Visit | Attending: Orthopedic Surgery

## 2016-08-14 ENCOUNTER — Ambulatory Visit (HOSPITAL_BASED_OUTPATIENT_CLINIC_OR_DEPARTMENT_OTHER)
Admission: RE | Admit: 2016-08-14 | Discharge: 2016-08-14 | Disposition: A | Discharge status: Home / Self Care | Payer: BLUE CROSS/BLUE SHIELD | Source: Ambulatory Visit | Attending: Orthopedic Surgery | Admitting: Orthopedic Surgery

## 2016-08-14 ENCOUNTER — Ambulatory Visit (HOSPITAL_BASED_OUTPATIENT_CLINIC_OR_DEPARTMENT_OTHER): Payer: BLUE CROSS/BLUE SHIELD | Admitting: Anesthesiology

## 2016-08-14 DIAGNOSIS — D2111 Benign neoplasm of connective and other soft tissue of right upper limb, including shoulder: Secondary | ICD-10-CM | POA: Insufficient documentation

## 2016-08-14 DIAGNOSIS — J309 Allergic rhinitis, unspecified: Secondary | ICD-10-CM | POA: Insufficient documentation

## 2016-08-14 DIAGNOSIS — Z880 Allergy status to penicillin: Secondary | ICD-10-CM | POA: Insufficient documentation

## 2016-08-14 DIAGNOSIS — Z79899 Other long term (current) drug therapy: Secondary | ICD-10-CM | POA: Insufficient documentation

## 2016-08-14 DIAGNOSIS — I10 Essential (primary) hypertension: Secondary | ICD-10-CM | POA: Insufficient documentation

## 2016-08-14 HISTORY — DX: Unspecified osteoarthritis, unspecified site: M19.90

## 2016-08-14 HISTORY — PX: MASS EXCISION: SHX2000

## 2016-08-14 SURGERY — EXCISION MASS
Anesthesia: Regional | Site: Finger | Laterality: Right

## 2016-08-14 MED ORDER — BUPIVACAINE HCL (PF) 0.25 % IJ SOLN
INTRAMUSCULAR | Status: DC | PRN
  Administered 2016-08-14: 4 mL

## 2016-08-14 MED ORDER — FENTANYL CITRATE (PF) 100 MCG/2ML IJ SOLN
50.0000 ug | INTRAMUSCULAR | Status: DC | PRN
  Administered 2016-08-14: 100 ug via INTRAVENOUS

## 2016-08-14 MED ORDER — MIDAZOLAM HCL 2 MG/2ML IJ SOLN
INTRAMUSCULAR | Status: AC
  Filled 2016-08-14: qty 2

## 2016-08-14 MED ORDER — PROPOFOL 500 MG/50ML IV EMUL
INTRAVENOUS | Status: DC | PRN
  Administered 2016-08-14: 25 ug/kg/min via INTRAVENOUS

## 2016-08-14 MED ORDER — LACTATED RINGERS IV SOLN
INTRAVENOUS | Status: DC
  Administered 2016-08-14: 13:00:00 via INTRAVENOUS

## 2016-08-14 MED ORDER — CHLORHEXIDINE GLUCONATE 4 % EX LIQD: 60.0000 mL | Freq: Once | CUTANEOUS | Status: DC

## 2016-08-14 MED ORDER — HYDROMORPHONE HCL 1 MG/ML IJ SOLN: 0.2500 mg | INTRAMUSCULAR | Status: DC | PRN

## 2016-08-14 MED ORDER — MIDAZOLAM HCL 2 MG/2ML IJ SOLN
1.0000 mg | INTRAMUSCULAR | Status: DC | PRN
  Administered 2016-08-14: 2 mg via INTRAVENOUS

## 2016-08-14 MED ORDER — ONDANSETRON HCL 4 MG/2ML IJ SOLN
INTRAMUSCULAR | Status: AC
  Filled 2016-08-14: qty 2

## 2016-08-14 MED ORDER — LIDOCAINE HCL (CARDIAC) 20 MG/ML IV SOLN
INTRAVENOUS | Status: DC | PRN
  Administered 2016-08-14: 30 mg via INTRAVENOUS

## 2016-08-14 MED ORDER — MEPERIDINE HCL 25 MG/ML IJ SOLN: 6.2500 mg | INTRAMUSCULAR | Status: DC | PRN

## 2016-08-14 MED ORDER — PROPOFOL 10 MG/ML IV BOLUS
INTRAVENOUS | Status: AC
  Filled 2016-08-14: qty 20

## 2016-08-14 MED ORDER — VANCOMYCIN HCL IN DEXTROSE 1-5 GM/200ML-% IV SOLN
INTRAVENOUS | Status: AC
  Filled 2016-08-14: qty 200

## 2016-08-14 MED ORDER — 0.9 % SODIUM CHLORIDE (POUR BTL) OPTIME
TOPICAL | Status: DC | PRN
  Administered 2016-08-14: 120 mL

## 2016-08-14 MED ORDER — FENTANYL CITRATE (PF) 100 MCG/2ML IJ SOLN
INTRAMUSCULAR | Status: AC
  Filled 2016-08-14: qty 2

## 2016-08-14 MED ORDER — ONDANSETRON HCL 4 MG/2ML IJ SOLN
INTRAMUSCULAR | Status: DC | PRN
  Administered 2016-08-14: 4 mg via INTRAVENOUS

## 2016-08-14 MED ORDER — LIDOCAINE 2% (20 MG/ML) 5 ML SYRINGE
INTRAMUSCULAR | Status: AC
  Filled 2016-08-14: qty 5

## 2016-08-14 MED ORDER — SCOPOLAMINE 1 MG/3DAYS TD PT72: 1.0000 | MEDICATED_PATCH | Freq: Once | TRANSDERMAL | Status: DC | PRN

## 2016-08-14 MED ORDER — HYDROCODONE-ACETAMINOPHEN 5-325 MG PO TABS: ORAL_TABLET | ORAL | 0 refills | Status: DC

## 2016-08-14 MED ORDER — ONDANSETRON HCL 4 MG/2ML IJ SOLN: 4.0000 mg | Freq: Once | INTRAMUSCULAR | Status: DC | PRN

## 2016-08-14 MED ORDER — VANCOMYCIN HCL IN DEXTROSE 1-5 GM/200ML-% IV SOLN
1000.0000 mg | INTRAVENOUS | Status: AC
  Administered 2016-08-14: 1000 mg via INTRAVENOUS

## 2016-08-14 SURGICAL SUPPLY — 53 items
BANDAGE ACE 3X5.8 VEL STRL LF (GAUZE/BANDAGES/DRESSINGS) IMPLANT
BANDAGE COBAN STERILE 2 (GAUZE/BANDAGES/DRESSINGS) IMPLANT
BENZOIN TINCTURE PRP APPL 2/3 (GAUZE/BANDAGES/DRESSINGS) IMPLANT
BLADE MINI RND TIP GREEN BEAV (BLADE) IMPLANT
BLADE SURG 15 STRL LF DISP TIS (BLADE) ×2 IMPLANT
BLADE SURG 15 STRL SS (BLADE) ×4
BNDG COHESIVE 1X5 TAN STRL LF (GAUZE/BANDAGES/DRESSINGS) ×3 IMPLANT
BNDG CONFORM 2 STRL LF (GAUZE/BANDAGES/DRESSINGS) IMPLANT
BNDG ELASTIC 2X5.8 VLCR STR LF (GAUZE/BANDAGES/DRESSINGS) IMPLANT
BNDG ESMARK 4X9 LF (GAUZE/BANDAGES/DRESSINGS) ×3 IMPLANT
BNDG GAUZE 1X2.1 STRL (MISCELLANEOUS) IMPLANT
BNDG GAUZE ELAST 4 BULKY (GAUZE/BANDAGES/DRESSINGS) IMPLANT
BNDG PLASTER X FAST 3X3 WHT LF (CAST SUPPLIES) IMPLANT
CHLORAPREP W/TINT 26ML (MISCELLANEOUS) ×3 IMPLANT
CLOSURE WOUND 1/2 X4 (GAUZE/BANDAGES/DRESSINGS)
CORDS BIPOLAR (ELECTRODE) ×3 IMPLANT
COVER BACK TABLE 60X90IN (DRAPES) ×3 IMPLANT
COVER MAYO STAND STRL (DRAPES) ×3 IMPLANT
CUFF TOURNIQUET SINGLE 18IN (TOURNIQUET CUFF) ×3 IMPLANT
DRAPE EXTREMITY T 121X128X90 (DRAPE) ×3 IMPLANT
DRAPE SURG 17X23 STRL (DRAPES) ×3 IMPLANT
GAUZE SPONGE 4X4 12PLY STRL (GAUZE/BANDAGES/DRESSINGS) ×3 IMPLANT
GAUZE XEROFORM 1X8 LF (GAUZE/BANDAGES/DRESSINGS) ×3 IMPLANT
GLOVE BIO SURGEON STRL SZ7 (GLOVE) ×3 IMPLANT
GLOVE BIO SURGEON STRL SZ7.5 (GLOVE) ×3 IMPLANT
GLOVE BIOGEL PI IND STRL 7.0 (GLOVE) ×1 IMPLANT
GLOVE BIOGEL PI IND STRL 8 (GLOVE) ×1 IMPLANT
GLOVE BIOGEL PI INDICATOR 7.0 (GLOVE) ×2
GLOVE BIOGEL PI INDICATOR 8 (GLOVE) ×2
GOWN STRL REUS W/ TWL LRG LVL3 (GOWN DISPOSABLE) ×1 IMPLANT
GOWN STRL REUS W/TWL LRG LVL3 (GOWN DISPOSABLE) ×2
GOWN STRL REUS W/TWL XL LVL3 (GOWN DISPOSABLE) ×3 IMPLANT
NEEDLE HYPO 25X1 1.5 SAFETY (NEEDLE) ×3 IMPLANT
NS IRRIG 1000ML POUR BTL (IV SOLUTION) ×3 IMPLANT
PACK BASIN DAY SURGERY FS (CUSTOM PROCEDURE TRAY) ×3 IMPLANT
PAD CAST 3X4 CTTN HI CHSV (CAST SUPPLIES) IMPLANT
PAD CAST 4YDX4 CTTN HI CHSV (CAST SUPPLIES) IMPLANT
PADDING CAST ABS 4INX4YD NS (CAST SUPPLIES)
PADDING CAST ABS COTTON 4X4 ST (CAST SUPPLIES) IMPLANT
PADDING CAST COTTON 3X4 STRL (CAST SUPPLIES)
PADDING CAST COTTON 4X4 STRL (CAST SUPPLIES)
SPLINT FINGER 2.25 911902 (SOFTGOODS) ×3 IMPLANT
STOCKINETTE 4X48 STRL (DRAPES) ×3 IMPLANT
STRIP CLOSURE SKIN 1/2X4 (GAUZE/BANDAGES/DRESSINGS) IMPLANT
SUT ETHILON 3 0 PS 1 (SUTURE) IMPLANT
SUT ETHILON 4 0 PS 2 18 (SUTURE) ×3 IMPLANT
SUT ETHILON 5 0 P 3 18 (SUTURE)
SUT NYLON ETHILON 5-0 P-3 1X18 (SUTURE) IMPLANT
SUT VIC AB 4-0 P2 18 (SUTURE) IMPLANT
SYR BULB 3OZ (MISCELLANEOUS) ×3 IMPLANT
SYR CONTROL 10ML LL (SYRINGE) ×3 IMPLANT
TOWEL OR 17X24 6PK STRL BLUE (TOWEL DISPOSABLE) ×3 IMPLANT
UNDERPAD 30X30 (UNDERPADS AND DIAPERS) ×3 IMPLANT

## 2016-08-14 NOTE — Transfer of Care (Signed)
Immediate Anesthesia Transfer of Care Note  Patient: Philip Cooley  Procedure(s) Performed: Procedure(s): EXCISIONAL BIOPSY MASS RIGHT SMALL FINGER (Right)  Patient Location: PACU  Anesthesia Type:Bier block  Level of Consciousness: awake, alert , oriented and patient cooperative  Airway & Oxygen Therapy: Patient Spontanous Breathing  Post-op Assessment: Report given to RN and Post -op Vital signs reviewed and stable  Post vital signs: Reviewed and stable  Last Vitals:  Vitals:   08/14/16 1212  BP: 123/87  Pulse: 75  Resp: 18  Temp: 36.8 C    Last Pain: There were no vitals filed for this visit.       Complications: No apparent anesthesia complications

## 2016-08-14 NOTE — Brief Op Note (Signed)
08/14/2016  2:14 PM  PATIENT:  [name]  51 y.o. male  PRE-OPERATIVE DIAGNOSIS:  MASS RIGHT SMALL FINGER  POST-OPERATIVE DIAGNOSIS:  MASS RIGHT SMALL FINGER  PROCEDURE:  Procedure(s): EXCISIONAL BIOPSY MASS RIGHT SMALL FINGER (Right)  SURGEON:  Surgeon(s) and Role:    * Leanora Cover, MD - Primary  PHYSICIAN ASSISTANT:   ASSISTANTS: none   ANESTHESIA:   Bier block with sedation  EBL:  Total I/O In: 200 [I.V.:200] Out: -   BLOOD ADMINISTERED:none  DRAINS: none   LOCAL MEDICATIONS USED:  MARCAINE     SPECIMEN:  Source of Specimen:  right small finger  DISPOSITION OF SPECIMEN:  PATHOLOGY  COUNTS:  YES  TOURNIQUET:   Total Tourniquet Time Documented: Forearm (Right) - 25 minutes Total: Forearm (Right) - 25 minutes   DICTATION: .Other Dictation: Dictation Number [id number]  PLAN OF CARE: Discharge to home after PACU  PATIENT DISPOSITION:  PACU - hemodynamically stable.

## 2016-08-14 NOTE — Anesthesia Preprocedure Evaluation (Signed)
Anesthesia Evaluation  Patient identified by MRN, date of birth, ID band Patient awake    Reviewed: Allergy & Precautions, NPO status , Patient's Chart, lab work & pertinent test results  Airway Mallampati: I  TM Distance: >3 FB Neck ROM: Full    Dental   Pulmonary    Pulmonary exam normal        Cardiovascular hypertension, Pt. on medications Normal cardiovascular exam     Neuro/Psych    GI/Hepatic   Endo/Other    Renal/GU      Musculoskeletal   Abdominal   Peds  Hematology   Anesthesia Other Findings   Reproductive/Obstetrics                             Anesthesia Physical Anesthesia Plan  ASA: II  Anesthesia Plan: Bier Block   Post-op Pain Management:    Induction: Intravenous  Airway Management Planned: Simple Face Mask  Additional Equipment:   Intra-op Plan:   Post-operative Plan:   Informed Consent: I have reviewed the patients History and Physical, chart, labs and discussed the procedure including the risks, benefits and alternatives for the proposed anesthesia with the patient or authorized representative who has indicated his/her understanding and acceptance.     Plan Discussed with: CRNA and Surgeon  Anesthesia Plan Comments:         Anesthesia Quick Evaluation

## 2016-08-14 NOTE — Discharge Instructions (Addendum)

## 2016-08-14 NOTE — Op Note (Signed)
[id number] 

## 2016-08-14 NOTE — Progress Notes (Signed)
EKG reviewed by Dr. Carignan, will proceed with surgery as scheduled. 

## 2016-08-14 NOTE — Anesthesia Postprocedure Evaluation (Signed)
Anesthesia Post Note  Patient: Philip Cooley  Procedure(s) Performed: Procedure(s) (LRB): EXCISIONAL BIOPSY MASS RIGHT SMALL FINGER (Right)  Patient location during evaluation: PACU Anesthesia Type: Bier Block Level of consciousness: awake and alert Pain management: pain level controlled Vital Signs Assessment: post-procedure vital signs reviewed and stable Respiratory status: spontaneous breathing, nonlabored ventilation, respiratory function stable and patient connected to nasal cannula oxygen Cardiovascular status: stable and blood pressure returned to baseline Anesthetic complications: no       Last Vitals:  Vitals:   08/14/16 1430 08/14/16 1454  BP:  (P) 112/84  Pulse: 72 (P) 65  Resp: 17 (P) 18  Temp:  (P) 36.6 C    Last Pain: There were no vitals filed for this visit.               Montez Hageman

## 2016-08-14 NOTE — H&P (Signed)
  Philip Cooley is an 51 y.o. male.   Chief Complaint: right small finger mass HPI: 51 yo rhd male with mass right small finger x 1 year.  It is bothersome to him especially when he bumps it.  He wishes to have it removed.  Allergies:  Allergies  Allergen Reactions  . Penicillins Swelling    Past Medical History:  Diagnosis Date  . ACUTE GINGIVITIS NONPLAQUE INDUCED 09/07/2009  . ALLERGIC RHINITIS 06/17/2008  . Arthritis    hands and knees  . HYPERTENSION 06/17/2008    Past Surgical History:  Procedure Laterality Date  . FINGER FRACTURE SURGERY      Family History: Family History  Problem Relation Age of Onset  . Diabetes Mother   . Cancer Mother   . Colon cancer Neg Hx     Social History:   reports that he has never smoked. He has never used smokeless tobacco. He reports that he does not drink alcohol or use drugs.  Medications: Medications Prior to Admission  Medication Sig Dispense Refill  . amLODipine-benazepril (LOTREL) 5-10 MG capsule TAKE 1 CAPSULE DAILY 90 capsule 3  . cetirizine (ZYRTEC) 10 MG tablet Take 10 mg by mouth daily.    . Multiple Vitamin (MULTIVITAMIN WITH MINERALS) TABS Take 1 tablet by mouth daily.    . vitamin C (ASCORBIC ACID) 500 MG tablet Take 500 mg by mouth daily.      No results found for this or any previous visit (from the past 48 hour(s)).  No results found.   A comprehensive review of systems was negative.  Blood pressure 123/87, pulse 75, temperature 98.3 F (36.8 C), resp. rate 18, height 5\' 5"  (1.651 m), weight 80.7 kg (178 lb), SpO2 100 %.  General appearance: alert, cooperative and appears stated age Head: Normocephalic, without obvious abnormality, atraumatic Neck: supple, symmetrical, trachea midline Resp: clear to auscultation bilaterally Cardio: regular rate and rhythm GI: non-tender Extremities: Intact sensation and capillary refill all digits.  +epl/fpl/io.  No wounds.  Pulses: 2+ and symmetric Skin: Skin color,  texture, turgor normal. No rashes or lesions Neurologic: Grossly normal Incision/Wound:none  Assessment/Plan Right small finger mass. Non operative and operative treatment options were discussed with the patient and patient wishes to proceed with operative treatment. Risks, benefits, and alternatives of surgery were discussed and the patient agrees with the plan of care.   Makaylee Spielberg R 08/14/2016, 12:53 PM

## 2016-08-14 NOTE — Op Note (Signed)
Philip Cooley, Philip Cooley                ACCOUNT NO.:  [id number]  MEDICAL RECORD NO.:  [id number]  LOCATION:                                 FACILITY:  PHYSICIAN:  Leanora Cover, MD             DATE OF BIRTH:  DATE OF PROCEDURE:  08/14/2016 DATE OF DISCHARGE:                              OPERATIVE REPORT   PREOPERATIVE DIAGNOSIS:  Right small finger mass.  POSTOPERATIVE DIAGNOSIS:  Right small finger mass.  PROCEDURE:  Excision of right small finger subcutaneous mass measuring 12 mm x 10 mm.  SURGEON:  Leanora Cover, MD  ASSISTANT:  None.  ANESTHESIA:  Bier block with sedation.  IV FLUIDS:  Per anesthesia flow sheet.  ESTIMATED BLOOD LOSS:  Minimal.  COMPLICATIONS:  None.  SPECIMENS:  Right small finger mass to Pathology.  TOURNIQUET TIME:  25 minutes.  DISPOSITION:  Stable to PACU.  INDICATIONS:  Philip Cooley is a 51 year old male who has noted a mass on the right small finger for approximately 1 year.  It is bothersome to him.  He wished to have this removed.  Risks, benefits and alternative of the surgery were discussed including the risk of blood loss; infection; damage to nerves, vessels, tendons, ligaments, bone; failure of surgery; need for additional surgery; complications with wound healing; continued pain and recurrence of mass.  He voiced understanding of these risks and elected to proceed.  OPERATIVE COURSE:  After being identified preoperatively by myself, the patient and I agreed upon the procedure and site of procedure.  Surgical site was marked.  Risks, benefits and alternatives of the surgery were reviewed and he wished to proceed.  Surgical consent had been signed. He was given IV vancomycin as preoperative antibiotic prophylaxis due to PENICILLIN allergy.  He was transferred to the operative room and placed on the operating room table in supine position with the right upper extremity on armboard.  Bier block anesthesia was induced by Anesthesiology.  Right  upper extremity was prepped and draped in normal sterile orthopedic fashion.  A surgical pause was performed between the surgeons, anesthesia and operating room staff; and all were in agreement as to the patient, procedure and site of procedure.  Tourniquet at the proximal aspect of the forearm had been inflated for the Bier block. Incision was made over the mass in the dorsum of the finger.  This was carried into subcutaneous tissues by spreading technique.  The mass was easily identified.  It was yellowish in coloration.  It was solid.  It was adherent to the undersurface of the skin and the tendon below it. It was carefully freed up.  Bipolar electrocautery was used to obtain hemostasis.  The mass was removed.  It was felt to have been removed in its entirety.  It was sent to Pathology for examination.  Measured 12 mm x 10 mm.  The wound was copiously irrigated with sterile saline.  It was then closed with 4-0 nylon in a horizontal mattress fashion.  A digital block had been performed at the start of the case with 10 mL of 0.25% plain Marcaine to aid in operative and postoperative analgesia.  The wound was dressed with sterile Xeroform, 4 x 4, and wrapped with a Coban dressing lightly.  Alumafoam splint was placed and wrapped lightly with Coban dressing.  Tourniquet was deflated at 25 minutes.  Fingertips were pink with brisk capillary refill after deflation of tourniquet. Operative drapes were broken down, the patient was awoken from anesthesia safely.  He was transferred back to the stretcher and taken to PACU in stable condition.  I will see him back in the office in 1 week for postoperative followup.  I will give him Norco 5/325 one to two p.o. q.6 hours p.r.n. pain, dispensed #15.     Leanora Cover, MD     KK/MEDQ  D:  08/14/2016  T:  08/14/2016  Job:  [id number]

## 2016-08-14 NOTE — Anesthesia Procedure Notes (Signed)
Anesthesia Regional Block: Bier block (IV Regional)   Pre-Anesthetic Checklist: ,, timeout performed, Correct Patient, Correct Site, Correct Laterality, Correct Procedure,, site marked, surgical consent,, at surgeon's request  Laterality: Right     Needles:  Injection technique: Single-shot  Needle Type: Other      Needle Gauge: 20     Additional Needles:   Procedures:,,,,,,, Esmarch exsanguination, single tourniquet utilized,  Narrative:   Performed by: Personally

## 2016-08-15 ENCOUNTER — Encounter (HOSPITAL_BASED_OUTPATIENT_CLINIC_OR_DEPARTMENT_OTHER): Payer: Self-pay | Admitting: Orthopedic Surgery

## 2016-12-13 ENCOUNTER — Encounter: Payer: Self-pay | Admitting: Internal Medicine

## 2016-12-29 ENCOUNTER — Other Ambulatory Visit: Payer: Self-pay | Admitting: Internal Medicine

## 2017-09-05 ENCOUNTER — Ambulatory Visit: Payer: BLUE CROSS/BLUE SHIELD | Admitting: Family Medicine

## 2017-09-05 ENCOUNTER — Encounter: Payer: Self-pay | Admitting: Family Medicine

## 2017-09-05 ENCOUNTER — Other Ambulatory Visit: Payer: Self-pay

## 2017-09-05 VITALS — BP 120/88 | HR 82 | Temp 98.2°F | Wt 195.0 lb

## 2017-09-05 DIAGNOSIS — J069 Acute upper respiratory infection, unspecified: Secondary | ICD-10-CM

## 2017-09-05 DIAGNOSIS — J029 Acute pharyngitis, unspecified: Secondary | ICD-10-CM

## 2017-09-05 LAB — POCT RAPID STREP A (OFFICE): Rapid Strep A Screen: NEGATIVE

## 2017-09-05 MED ORDER — AMLODIPINE BESY-BENAZEPRIL HCL 5-10 MG PO CAPS: 1.0000 | ORAL_CAPSULE | Freq: Every day | ORAL | 3 refills | Status: DC

## 2017-09-05 NOTE — Progress Notes (Signed)
Subjective:    Patient ID: Philip Cooley, male    DOB: 05-01-1965, 52 y.o.   MRN: [id number]  No chief complaint on file.   HPI Patient was seen today for acute concern and transfer of care.  Pt endorses ST, HA, nasal congestion that turned into rhinorrhea since yesterday.  Pt has tried Vicks Sinex and nyquil for his symptoms.  Pt denies sick contacts, fever, chills, N/V, SOB.  HTN: pt endorses compliance with amlodipine-benazepril 5-10 mg daily.  Pt states his bp has been controlled typically 120s/80 when he checks it at [place].  Past Medical History:  Diagnosis Date  . ACUTE GINGIVITIS NONPLAQUE INDUCED 09/07/2009  . ALLERGIC RHINITIS 06/17/2008  . Arthritis    hands and knees  . HYPERTENSION 06/17/2008    Allergies  Allergen Reactions  . Penicillins Swelling    ROS General: Denies fever, chills, night sweats, changes in weight, changes in appetite HEENT: Denies ear pain, changes in vision   + rhinorrhea, sore throat, HA, congestion CV: Denies CP, palpitations, SOB, orthopnea Pulm: Denies SOB, cough, wheezing GI: Denies abdominal pain, nausea, vomiting, diarrhea, constipation GU: Denies dysuria, hematuria, frequency, vaginal discharge Msk: Denies muscle cramps, joint pains Neuro: Denies weakness, numbness, tingling Skin: Denies rashes, bruising Psych: Denies depression, anxiety, hallucinations     Objective:    Blood pressure 120/88, pulse 82, temperature 98.2 F (36.8 C), temperature source Oral, weight 195 lb (88.5 kg), SpO2 97 %.   Gen. Pleasant, well-nourished, in no distress, normal affect   HEENT: [place]/AT, face symmetric, no scleral icterus, PERRLA, nares patent with mild clear drainage, pharynx with mild erythema, no exudate.  TMs full bilaterally.  No cervical lymphadenopathy. Lungs: no accessory muscle use, CTAB, no wheezes or rales Cardiovascular: RRR, no m/r/g, no peripheral edema Abdomen: BS present, soft, NT/ND Neuro:  A&Ox3, CN II-XII intact, normal  gait Skin:  Warm, no lesions/ rash   Wt Readings from Last 3 Encounters:  09/05/17 195 lb (88.5 kg)  08/14/16 178 lb (80.7 kg)  07/20/16 183 lb (83 kg)    Lab Results  Component Value Date   WBC 5.2 12/05/2015   HGB 17.2 (H) 12/05/2015   HCT 50.4 12/05/2015   PLT 192.0 12/05/2015   GLUCOSE 94 12/05/2015   CHOL 161 12/05/2015   TRIG 49.0 12/05/2015   HDL 54.90 12/05/2015   LDLCALC 96 12/05/2015   ALT 25 12/05/2015   AST 21 12/05/2015   NA 136 12/05/2015   K 3.9 12/05/2015   CL 103 12/05/2015   CREATININE 1.02 12/05/2015   BUN 14 12/05/2015   CO2 28 12/05/2015   TSH 0.90 12/05/2015   PSA 0.78 12/05/2015    Assessment/Plan:  Viral URI  -rapid strep negative -supportive care -given handout -pt advised to wean himself off of Vick's Sinex nasal spray  -f/u prn if symptoms become worse  Grier Mitts, MD

## 2017-09-05 NOTE — Addendum Note (Signed)
Addended by: Wyvonne Lenz on: 09/05/2017 09:07 AM   Modules accepted: Orders

## 2017-09-05 NOTE — Patient Instructions (Addendum)
INSTRUCTIONS FOR UPPER RESPIRATORY INFECTION:   -plenty of rest and fluids   -nasal saline wash 2-3 times daily (use prepackaged nasal saline or bottled/distilled water if making your own)    -can use AFRIN or Vick's nasal spray for drainage and nasal congestion - but do NOT use longer then 3-4 days   -can use tylenol (in no history of liver disease) or ibuprofen (if no history of kidney disease, bowel bleeding or significant heart disease) as directed for aches and sorethroat   -in the winter time, using a humidifier at night is helpful (please follow cleaning instructions)   -if you are taking a cough medication - use only as directed, may also try a teaspoon of honey to coat the throat and throat lozenges. If given a cough medication with codeine or hydrocodone or other narcotic please be advised that this contains a strong and  potentially addicting medication. Please follow instructions carefully, take as little as possible and only use AS NEEDED for severe cough. Discuss potential side effects with your pharmacy. Please do not drive or operate machinery while taking these types of medications. Please do not take other sedating medications, drugs or alcohol while taking this medication without discussing with your doctor.   -for sore throat, salt water gargles can help   -follow up if you have fevers, facial pain, tooth pain, difficulty breathing or are worsening or symptoms persist longer then expected    Upper Respiratory Infection, Adult Most upper respiratory infections (URIs) are a viral infection of the air passages leading to the lungs. A URI affects the nose, throat, and upper air passages. The most common type of URI is nasopharyngitis and is typically referred to as "the common cold." URIs run their course and usually go away on their own. Most of the time, a URI does not require medical attention, but sometimes a bacterial infection in the upper airways can follow a viral  infection. This is called a secondary infection. Sinus and middle ear infections are common types of secondary upper respiratory infections. Bacterial pneumonia can also complicate a URI. A URI can worsen asthma and chronic obstructive pulmonary disease (COPD). Sometimes, these complications can require emergency medical care and may be life threatening. What are the causes? Almost all URIs are caused by viruses. A virus is a type of germ and can spread from one person to another. What increases the risk? You may be at risk for a URI if:  You smoke.  You have chronic heart or lung disease.  You have a weakened defense (immune) system.  You are very young or very old.  You have nasal allergies or asthma.  You work in crowded or poorly ventilated areas.  You work in health care facilities or schools.  What are the signs or symptoms? Symptoms typically develop 2-3 days after you come in contact with a cold virus. Most viral URIs last 7-10 days. However, viral URIs from the influenza virus (flu virus) can last 14-18 days and are typically more severe. Symptoms may include:  Runny or stuffy (congested) nose.  Sneezing.  Cough.  Sore throat.  Headache.  Fatigue.  Fever.  Loss of appetite.  Pain in your forehead, behind your eyes, and over your cheekbones (sinus pain).  Muscle aches.  How is this diagnosed? Your health care provider may diagnose a URI by:  Physical exam.  Tests to check that your symptoms are not due to another condition such as: ? Strep throat. ? Sinusitis. ?  Pneumonia. ? Asthma.  How is this treated? A URI goes away on its own with time. It cannot be cured with medicines, but medicines may be prescribed or recommended to relieve symptoms. Medicines may help:  Reduce your fever.  Reduce your cough.  Relieve nasal congestion.  Follow these instructions at home:  Take medicines only as directed by your health care provider.  Gargle warm  saltwater or take cough drops to comfort your throat as directed by your health care provider.  Use a warm mist humidifier or inhale steam from a shower to increase air moisture. This may make it easier to breathe.  Drink enough fluid to keep your urine clear or pale yellow.  Eat soups and other clear broths and maintain good nutrition.  Rest as needed.  Return to work when your temperature has returned to normal or as your health care provider advises. You may need to stay home longer to avoid infecting others. You can also use a face mask and careful hand washing to prevent spread of the virus.  Increase the usage of your inhaler if you have asthma.  Do not use any tobacco products, including cigarettes, chewing tobacco, or electronic cigarettes. If you need help quitting, ask your health care provider. How is this prevented? The best way to protect yourself from getting a cold is to practice good hygiene.  Avoid oral or hand contact with people with cold symptoms.  Wash your hands often if contact occurs.  There is no clear evidence that vitamin C, vitamin E, echinacea, or exercise reduces the chance of developing a cold. However, it is always recommended to get plenty of rest, exercise, and practice good nutrition. Contact a health care provider if:  You are getting worse rather than better.  Your symptoms are not controlled by medicine.  You have chills.  You have worsening shortness of breath.  You have brown or red mucus.  You have yellow or brown nasal discharge.  You have pain in your face, especially when you bend forward.  You have a fever.  You have swollen neck glands.  You have pain while swallowing.  You have white areas in the back of your throat. Get help right away if:  You have severe or persistent: ? Headache. ? Ear pain. ? Sinus pain. ? Chest pain.  You have chronic lung disease and any of the following: ? Wheezing. ? Prolonged  cough. ? Coughing up blood. ? A change in your usual mucus.  You have a stiff neck.  You have changes in your: ? Vision. ? Hearing. ? Thinking. ? Mood. This information is not intended to replace advice given to you by your health care provider. Make sure you discuss any questions you have with your health care provider. Document Released: 09/05/2000 Document Revised: 11/13/2015 Document Reviewed: 06/17/2013 Elsevier Interactive Patient Education  [employer].

## 2017-10-24 ENCOUNTER — Encounter: Payer: Self-pay | Admitting: Family Medicine

## 2017-10-24 ENCOUNTER — Ambulatory Visit (INDEPENDENT_AMBULATORY_CARE_PROVIDER_SITE_OTHER): Payer: BLUE CROSS/BLUE SHIELD | Admitting: Adult Health

## 2017-10-24 ENCOUNTER — Encounter: Payer: Self-pay | Admitting: Adult Health

## 2017-10-24 VITALS — BP 128/86 | Temp 98.2°F | Wt 187.0 lb

## 2017-10-24 DIAGNOSIS — M545 Low back pain, unspecified: Secondary | ICD-10-CM

## 2017-10-24 DIAGNOSIS — M25562 Pain in left knee: Secondary | ICD-10-CM | POA: Diagnosis not present

## 2017-10-24 MED ORDER — CYCLOBENZAPRINE HCL 10 MG PO TABS: 10.0000 mg | ORAL_TABLET | Freq: Three times a day (TID) | ORAL | 0 refills | Status: DC | PRN

## 2017-10-24 MED ORDER — METHYLPREDNISOLONE 4 MG PO TBPK: ORAL_TABLET | ORAL | 0 refills | Status: DC

## 2017-10-24 NOTE — Progress Notes (Signed)
Subjective:    Patient ID: Philip Cooley, male    DOB: [birth year], 52 y.o.   MRN: [id number]  HPI 52 year old male who  has a past medical history of ACUTE GINGIVITIS NONPLAQUE INDUCED (09/07/2009), ALLERGIC RHINITIS (06/17/2008), Arthritis, and HYPERTENSION (06/17/2008).  He is a patient of Dr. Raliegh Ip who I am seeing today for an acute care visit for low back pain and swelling of left knee. He reports that he has had low back pain on and off for about a year, most recently had had pain in left lower back for the last few weeks. Also reports left knee pain for the last month. He and his wife have started doing body pump at the gym and he believes this is the causative factor.   Pain in his lower back is worse with bending and twisting. No issues with bowel or bladder. Pain is felt as an "ache" and " tight" feeling.   Pain in the knee is worse with extension and walking. This pain is located on the outside of his knee and radiates to the back of his knee. Denies any calf pain/redness/warmth. Does not feel as though knee is unstable. Has had intermittent swelling.   Review of Systems See HPI   Past Medical History:  Diagnosis Date  . ACUTE GINGIVITIS NONPLAQUE INDUCED 09/07/2009  . ALLERGIC RHINITIS 06/17/2008  . Arthritis    hands and knees  . HYPERTENSION 06/17/2008    Social History   Socioeconomic History  . Marital status: Married    Spouse name: Not on file  . Number of children: Not on file  . Years of education: Not on file  . Highest education level: Not on file  Occupational History  . Not on file  Social Needs  . Financial resource strain: Not on file  . Food insecurity:    Worry: Not on file    Inability: Not on file  . Transportation needs:    Medical: Not on file    Non-medical: Not on file  Tobacco Use  . Smoking status: Never Smoker  . Smokeless tobacco: Never Used  Substance and Sexual Activity  . Alcohol use: No  . Drug use: No  . Sexual activity: Not on file    Lifestyle  . Physical activity:    Days per week: Not on file    Minutes per session: Not on file  . Stress: Not on file  Relationships  . Social connections:    Talks on phone: Not on file    Gets together: Not on file    Attends religious service: Not on file    Active member of club or organization: Not on file    Attends meetings of clubs or organizations: Not on file    Relationship status: Not on file  . Intimate partner violence:    Fear of current or ex partner: Not on file    Emotionally abused: Not on file    Physically abused: Not on file    Forced sexual activity: Not on file  Other Topics Concern  . Not on file  Social History Narrative  . Not on file    Past Surgical History:  Procedure Laterality Date  . FINGER FRACTURE SURGERY    . MASS EXCISION Right 08/14/2016   Procedure: EXCISIONAL BIOPSY MASS RIGHT SMALL FINGER;  Surgeon: Leanora Cover, MD;  Location: [facility];  Service: Orthopedics;  Laterality: Right;    Family History  Problem Relation Age  of Onset  . Diabetes Mother   . Cancer Mother   . Colon cancer Neg Hx     Allergies  Allergen Reactions  . Penicillins Swelling    Current Outpatient Medications on File Prior to Visit  Medication Sig Dispense Refill  . amLODipine-benazepril (LOTREL) 5-10 MG capsule Take 1 capsule by mouth daily. 90 capsule 3  . cetirizine (ZYRTEC) 10 MG tablet Take 10 mg by mouth daily.    . Multiple Vitamin (MULTIVITAMIN WITH MINERALS) TABS Take 1 tablet by mouth daily.    . vitamin C (ASCORBIC ACID) 500 MG tablet Take 500 mg by mouth daily.     No current facility-administered medications on file prior to visit.     BP 128/86   Temp 98.2 F (36.8 C) (Oral)   Wt 187 lb (84.8 kg)   BMI 31.12 kg/m       Objective:   Physical Exam  Constitutional: He appears well-developed and well-nourished. No distress.  Musculoskeletal: He exhibits tenderness. He exhibits no edema or deformity.       Left  knee: He exhibits normal range of motion, no swelling, no effusion, no deformity, no erythema, normal alignment, no LCL laxity, normal patellar mobility, no bony tenderness, normal meniscus and no MCL laxity. Tenderness found. LCL tenderness noted. No lateral joint line and no patellar tendon tenderness noted.       Lumbar back: He exhibits tenderness, pain and spasm. He exhibits normal range of motion, no bony tenderness, no swelling, no edema, no deformity and normal pulse.  Skin: He is not diaphoretic.  Vitals reviewed.     Assessment & Plan:  1. Acute left-sided low back pain without sciatica - appears as muscle spasm. No spinal tenderness or deformity.  - Advised warm compress and anti inflammatories. Will prescribe flexeril and medrol dose pack.  - Rest knee for the next 3 days  - cyclobenzaprine (FLEXERIL) 10 MG tablet; Take 1 tablet (10 mg total) by mouth 3 (three) times daily as needed for muscle spasms.  Dispense: 30 tablet; Refill: 0 - methylPREDNISolone (MEDROL DOSEPAK) 4 MG TBPK tablet; Take as directed  Dispense: 21 tablet; Refill: 0  2. Acute pain of left knee - Appears as possible tendonitis. No concern for meniscus tear. Knee stable.  - Rest for 3 days  - Take medications as directed.  - Follow up if no improvement in the next 2-3 days  - cyclobenzaprine (FLEXERIL) 10 MG tablet; Take 1 tablet (10 mg total) by mouth 3 (three) times daily as needed for muscle spasms.  Dispense: 30 tablet; Refill: 0 - methylPREDNISolone (MEDROL DOSEPAK) 4 MG TBPK tablet; Take as directed  Dispense: 21 tablet; Refill: 0  Dorothyann Peng, NP

## 2017-11-12 ENCOUNTER — Encounter: Payer: Self-pay | Admitting: Family Medicine

## 2017-11-12 ENCOUNTER — Ambulatory Visit (INDEPENDENT_AMBULATORY_CARE_PROVIDER_SITE_OTHER): Payer: BLUE CROSS/BLUE SHIELD | Admitting: Family Medicine

## 2017-11-12 VITALS — BP 126/88 | HR 86 | Temp 98.3°F | Ht 65.0 in | Wt 187.0 lb

## 2017-11-12 DIAGNOSIS — Z131 Encounter for screening for diabetes mellitus: Secondary | ICD-10-CM | POA: Diagnosis not present

## 2017-11-12 DIAGNOSIS — Z13220 Encounter for screening for lipoid disorders: Secondary | ICD-10-CM | POA: Diagnosis not present

## 2017-11-12 DIAGNOSIS — Z125 Encounter for screening for malignant neoplasm of prostate: Secondary | ICD-10-CM | POA: Diagnosis not present

## 2017-11-12 DIAGNOSIS — Z0000 Encounter for general adult medical examination without abnormal findings: Secondary | ICD-10-CM

## 2017-11-12 DIAGNOSIS — B354 Tinea corporis: Secondary | ICD-10-CM | POA: Diagnosis not present

## 2017-11-12 MED ORDER — TERBINAFINE HCL 1 % EX CREA: 1.0000 "application " | TOPICAL_CREAM | Freq: Two times a day (BID) | CUTANEOUS | 0 refills | Status: DC

## 2017-11-12 NOTE — Patient Instructions (Addendum)
Preventive Care 40-64 Years, Male Preventive care refers to lifestyle choices and visits with your health care provider that can promote health and wellness. What does preventive care include?  A yearly physical exam. This is also called an annual well check.  Dental exams once or twice a year.  Routine eye exams. Ask your health care provider how often you should have your eyes checked.  Personal lifestyle choices, including: ? Daily care of your teeth and gums. ? Regular physical activity. ? Eating a healthy diet. ? Avoiding tobacco and drug use. ? Limiting alcohol use. ? Practicing safe sex. ? Taking low-dose aspirin every day starting at age 39. What happens during an annual well check? The services and screenings done by your health care provider during your annual well check will depend on your age, overall health, lifestyle risk factors, and family history of disease. Counseling Your health care provider may ask you questions about your:  Alcohol use.  Tobacco use.  Drug use.  Emotional well-being.  Home and relationship well-being.  Sexual activity.  Eating habits.  Work and work Statistician.  Screening You may have the following tests or measurements:  Height, weight, and BMI.  Blood pressure.  Lipid and cholesterol levels. These may be checked every 5 years, or more frequently if you are over 76 years old.  Skin check.  Lung cancer screening. You may have this screening every year starting at age 19 if you have a 30-pack-year history of smoking and currently smoke or have quit within the past 15 years.  Fecal occult blood test (FOBT) of the stool. You may have this test every year starting at age 26.  Flexible sigmoidoscopy or colonoscopy. You may have a sigmoidoscopy every 5 years or a colonoscopy every 10 years starting at age 17.  Prostate cancer screening. Recommendations will vary depending on your family history and other risks.  Hepatitis C  blood test.  Hepatitis B blood test.  Sexually transmitted disease (STD) testing.  Diabetes screening. This is done by checking your blood sugar (glucose) after you have not eaten for a while (fasting). You may have this done every 1-3 years.  Discuss your test results, treatment options, and if necessary, the need for more tests with your health care provider. Vaccines Your health care provider may recommend certain vaccines, such as:  Influenza vaccine. This is recommended every year.  Tetanus, diphtheria, and acellular pertussis (Tdap, Td) vaccine. You may need a Td booster every 10 years.  Varicella vaccine. You may need this if you have not been vaccinated.  Zoster vaccine. You may need this after age 79.  Measles, mumps, and rubella (MMR) vaccine. You may need at least one dose of MMR if you were born in 1957 or later. You may also need a second dose.  Pneumococcal 13-valent conjugate (PCV13) vaccine. You may need this if you have certain conditions and have not been vaccinated.  Pneumococcal polysaccharide (PPSV23) vaccine. You may need one or two doses if you smoke cigarettes or if you have certain conditions.  Meningococcal vaccine. You may need this if you have certain conditions.  Hepatitis A vaccine. You may need this if you have certain conditions or if you travel or work in places where you may be exposed to hepatitis A.  Hepatitis B vaccine. You may need this if you have certain conditions or if you travel or work in places where you may be exposed to hepatitis B.  Haemophilus influenzae type b (Hib) vaccine.  You may need this if you have certain risk factors.  Talk to your health care provider about which screenings and vaccines you need and how often you need them. This information is not intended to replace advice given to you by your health care provider. Make sure you discuss any questions you have with your health care provider. Document Released: 04/08/2015  Document Revised: 11/30/2015 Document Reviewed: 01/11/2015 Elsevier Interactive Patient Education  2018 [employer].   Body Ringworm Body ringworm is an infection of the skin that often causes a ring-shaped rash. Body ringworm can affect any part of your skin. It can spread easily to others. Body ringworm is also called tinea corporis. What are the causes? This condition is caused by funguses called dermatophytes. The condition develops when these funguses grow out of control on the skin. You can get this condition if you touch a person or animal that has it. You can also get it if you share clothing, bedding, towels, or any other object with an infected person or pet. What increases the risk? This condition is more likely to develop in:  Athletes who often make skin-to-skin contact with other athletes, such as wrestlers.  People who share equipment and mats.  People with a weakened immune system.  What are the signs or symptoms? Symptoms of this condition include:  Itchy, raised red spots and bumps.  Red scaly patches.  A ring-shaped rash. The rash may have: ? A clear center. ? Scales or red bumps at its center. ? Redness near its borders. ? Dry and scaly skin on or around it.  How is this diagnosed? This condition can usually be diagnosed with a skin exam. A skin scraping may be taken from the affected area and examined under a microscope to see if the fungus is present. How is this treated? This condition may be treated with:  An antifungal cream or ointment.  An antifungal shampoo.  Antifungal medicines. These may be prescribed if your ringworm is severe, keeps coming back, or lasts a long time.  Follow these instructions at home:  Take over-the-counter and prescription medicines only as told by your health care provider.  If you were given an antifungal cream or ointment: ? Use it as told by your health care provider. ? Wash the infected area and dry it  completely before applying the cream or ointment.  If you were given an antifungal shampoo: ? Use it as told by your health care provider. ? Leave the shampoo on your body for 3-5 minutes before rinsing.  While you have a rash: ? Wear loose clothing to stop clothes from rubbing and irritating it. ? Wash or change your bed sheets every night.  If your pet has the same infection, take your pet to see a Animal nutritionist. How is this prevented?  Practice good hygiene.  Wear sandals or shoes in public places and showers.  Do not share personal items with others.  Avoid touching red patches of skin on other people.  Avoid touching pets that have bald spots.  If you touch an animal that has a bald spot, wash your hands. Contact a health care provider if:  Your rash continues to spread after 7 days of treatment.  Your rash is not gone in 4 weeks.  The area around your rash gets red, warm, tender, and swollen. This information is not intended to replace advice given to you by your health care provider. Make sure you discuss any questions you have with  your health care provider. Document Released: 03/09/2000 Document Revised: 08/18/2015 Document Reviewed: 01/06/2015 Elsevier Interactive Patient Education  [employer].

## 2017-11-12 NOTE — Progress Notes (Signed)
Subjective:     Philip Cooley is a 52 y.o. male and is here for a comprehensive physical exam. The patient reports problems - discoloration of skin.  Pt states the discoloration areas Pt is also dealing with sciatic pain.  Pt notes his back was hurting last wk but improved after prednisone and flexeril.    Social History   Socioeconomic History  . Marital status: Married    Spouse name: Not on file  . Number of children: Not on file  . Years of education: Not on file  . Highest education level: Not on file  Occupational History  . Not on file  Social Needs  . Financial resource strain: Not on file  . Food insecurity:    Worry: Not on file    Inability: Not on file  . Transportation needs:    Medical: Not on file    Non-medical: Not on file  Tobacco Use  . Smoking status: Never Smoker  . Smokeless tobacco: Never Used  Substance and Sexual Activity  . Alcohol use: No  . Drug use: No  . Sexual activity: Not on file  Lifestyle  . Physical activity:    Days per week: Not on file    Minutes per session: Not on file  . Stress: Not on file  Relationships  . Social connections:    Talks on phone: Not on file    Gets together: Not on file    Attends religious service: Not on file    Active member of club or organization: Not on file    Attends meetings of clubs or organizations: Not on file    Relationship status: Not on file  . Intimate partner violence:    Fear of current or ex partner: Not on file    Emotionally abused: Not on file    Physically abused: Not on file    Forced sexual activity: Not on file  Other Topics Concern  . Not on file  Social History Narrative  . Not on file   Health Maintenance  Topic Date Due  . HIV Screening  01/07/1980  . INFLUENZA VACCINE  10/24/2017  . COLONOSCOPY  01/03/2021  . TETANUS/TDAP  12/04/2025    The following portions of the patient's history were reviewed and updated as appropriate: allergies, current medications, past family  history, past medical history, past social history, past surgical history and problem list.  Review of Systems A comprehensive review of systems was negative.   Objective:    BP 126/88 (BP Location: Left Arm, Patient Position: Sitting, Cuff Size: Normal)   Pulse 86   Temp 98.3 F (36.8 C) (Oral)   Ht 5\' 5"  (1.651 m)   Wt 187 lb (84.8 kg)   SpO2 94%   BMI 31.12 kg/m  General appearance: alert, cooperative and no distress Head: Normocephalic, without obvious abnormality, atraumatic Eyes: conjunctivae/corneas clear. PERRL, EOM's intact. Fundi benign. Ears: normal TM's and external ear canals both ears Nose: Nares normal. Septum midline. Mucosa normal. No drainage or sinus tenderness. Throat: lips, mucosa, and tongue normal; teeth and gums normal Neck: no adenopathy, no carotid bruit, no JVD, supple, symmetrical, trachea midline and thyroid not enlarged, symmetric, no tenderness/mass/nodules Lungs: clear to auscultation bilaterally Heart: regular rate and rhythm, S1, S2 normal, no murmur, click, rub or gallop Abdomen: soft, non-tender; bowel sounds normal; no masses,  no organomegaly Extremities: extremities normal, atraumatic, no cyanosis or edema Skin: Skin color, texture, turgor normal. No rashes.  3 oval areas of  hyperpigmentation with dry appearing center and slightly raised border on R upper abdomen, L upper abdomen, and L back  ~1x 2 cm Neurologic: Alert and oriented X 3, normal strength and tone. Normal symmetric reflexes. Normal coordination and gait    Assessment:    Healthy male exam. With tinea corporis.     Plan:     Anticipatory guidance given including wearing seatbelts, smoke detectors in the home, increasing physical activity, increasing p.o. intake of water and vegetables. -labs ordered: Lipid panel, hemoglobin A1c, CBC, BMP, PSA -Handout given -Influenza vaccine recommended when available See After Visit Summary for Counseling Recommendations   Tinea  corporis -Terbinafine 1% cream twice daily  Sciatica -discussed stretching -Consider physical therapy for continued symptoms.  Follow-up PRN  Grier Mitts, MD

## 2017-11-13 LAB — CBC WITH DIFFERENTIAL/PLATELET
BASOS ABS: 0.1 10*3/uL (ref 0.0–0.1)
Basophils Relative: 1.3 % (ref 0.0–3.0)
EOS ABS: 0.2 10*3/uL (ref 0.0–0.7)
Eosinophils Relative: 2.4 % (ref 0.0–5.0)
Hemoglobin: 17.9 g/dL — ABNORMAL HIGH (ref 13.0–17.0)
Lymphocytes Relative: 23.4 % (ref 12.0–46.0)
Lymphs Abs: 1.7 10*3/uL (ref 0.7–4.0)
MCHC: 32.9 g/dL (ref 30.0–36.0)
MCV: 90 fl (ref 78.0–100.0)
MONOS PCT: 9.6 % (ref 3.0–12.0)
Monocytes Absolute: 0.7 10*3/uL (ref 0.1–1.0)
Neutro Abs: 4.6 10*3/uL (ref 1.4–7.7)
Neutrophils Relative %: 63.3 % (ref 43.0–77.0)
Platelets: 206 10*3/uL (ref 150.0–400.0)
RBC: 6.04 Mil/uL — AB (ref 4.22–5.81)
RDW: 13.8 % (ref 11.5–15.5)
WBC: 7.3 10*3/uL (ref 4.0–10.5)

## 2017-11-13 LAB — LIPID PANEL
CHOL/HDL RATIO: 3
CHOLESTEROL: 173 mg/dL (ref 0–200)
HDL: 55.4 mg/dL (ref >39.00)
LDL CALC: 104 mg/dL — AB (ref 0–99)
NonHDL: 117.15
TRIGLYCERIDES: 66 mg/dL (ref 0.0–149.0)
VLDL: 13.2 mg/dL (ref 0.0–40.0)

## 2017-11-13 LAB — BASIC METABOLIC PANEL
BUN: 13 mg/dL (ref 6–23)
CHLORIDE: 100 meq/L (ref 96–112)
CO2: 28 mEq/L (ref 19–32)
CREATININE: 1.29 mg/dL (ref 0.40–1.50)
Calcium: 9.8 mg/dL (ref 8.4–10.5)
GFR: 74.97 mL/min (ref >60.00)
GLUCOSE: 93 mg/dL (ref 70–99)
POTASSIUM: 4.1 meq/L (ref 3.5–5.1)
Sodium: 138 mEq/L (ref 135–145)

## 2017-11-13 LAB — HEMOGLOBIN A1C: Hgb A1c MFr Bld: 5.8 % (ref 4.6–6.5)

## 2017-11-13 LAB — PSA: PSA: 1.03 ng/mL (ref 0.10–4.00)

## 2017-12-12 ENCOUNTER — Encounter: Payer: Self-pay | Admitting: Family Medicine

## 2017-12-12 ENCOUNTER — Ambulatory Visit (INDEPENDENT_AMBULATORY_CARE_PROVIDER_SITE_OTHER): Payer: BLUE CROSS/BLUE SHIELD | Admitting: Family Medicine

## 2017-12-12 VITALS — BP 128/90 | HR 84 | Temp 98.0°F | Wt 191.0 lb

## 2017-12-12 DIAGNOSIS — M5432 Sciatica, left side: Secondary | ICD-10-CM | POA: Diagnosis not present

## 2017-12-12 MED ORDER — CYCLOBENZAPRINE HCL 10 MG PO TABS: 10.0000 mg | ORAL_TABLET | Freq: Three times a day (TID) | ORAL | 0 refills | Status: DC | PRN

## 2017-12-12 NOTE — Patient Instructions (Signed)
Sciatica Sciatica is pain, numbness, weakness, or tingling along the path of the sciatic nerve. The sciatic nerve starts in the lower back and runs down the back of each leg. The nerve controls the muscles in the lower leg and in the back of the knee. It also provides feeling (sensation) to the back of the thigh, the lower leg, and the sole of the foot. Sciatica is a symptom of another medical condition that pinches or puts pressure on the sciatic nerve. Generally, sciatica only affects one side of the body. Sciatica usually goes away on its own or with treatment. In some cases, sciatica may keep coming back (recur). What are the causes? This condition is caused by pressure on the sciatic nerve, or pinching of the sciatic nerve. This may be the result of:  A disk in between the bones of the spine (vertebrae) bulging out too far (herniated disk).  Age-related changes in the spinal disks (degenerative disk disease).  A pain disorder that affects a muscle in the buttock (piriformis syndrome).  Extra bone growth (bone spur) near the sciatic nerve.  An injury or break (fracture) of the pelvis.  Pregnancy.  Tumor (rare). What increases the risk? The following factors may make you more likely to develop this condition:  Playing sports that place pressure or stress on the spine, such as football or weight lifting.  Having poor strength and flexibility.  A history of back injury.  A history of back surgery.  Sitting for long periods of time.  Doing activities that involve repetitive bending or lifting.  Obesity. What are the signs or symptoms? Symptoms can vary from mild to very severe, and they may include:  Any of these problems in the lower back, leg, hip, or buttock:  Mild tingling or dull aches.  Burning sensations.  Sharp pains.  Numbness in the back of the calf or the sole of the foot.  Leg weakness.  Severe back pain that makes movement difficult. These symptoms may  get worse when you cough, sneeze, or laugh, or when you sit or stand for long periods of time. Being overweight may also make symptoms worse. In some cases, symptoms may recur over time. How is this diagnosed? This condition may be diagnosed based on:  Your symptoms.  A physical exam. Your health care provider may ask you to do certain movements to check whether those movements trigger your symptoms.  You may have tests, including:  Blood tests.  X-rays.  MRI.  CT scan. How is this treated? In many cases, this condition improves on its own, without any treatment. However, treatment may include:  Reducing or modifying physical activity during periods of pain.  Exercising and stretching to strengthen your abdomen and improve the flexibility of your spine.  Icing and applying heat to the affected area.  Medicines that help:  To relieve pain and swelling.  To relax your muscles.  Injections of medicines that help to relieve pain, irritation, and inflammation around the sciatic nerve (steroids).  Surgery. Follow these instructions at home: Medicines   Take over-the-counter and prescription medicines only as told by your health care provider.  Do not drive or operate heavy machinery while taking prescription pain medicine. Managing pain   If directed, apply ice to the affected area.  Put ice in a plastic bag.  Place a towel between your skin and the bag.  Leave the ice on for 20 minutes, 2-3 times a day.  After icing, apply heat to the   heat to the affected area before you exercise or as often as told by your health care provider. Use the heat source that your health care provider recommends, such as a moist heat pack or a heating pad. ? Place a towel between your skin and the heat source. ? Leave the heat on for 20-30 minutes. ? Remove the heat if your skin turns bright red. This is especially important if you are unable to feel pain, heat, or cold. You may have a  greater risk of getting burned. Activity  Return to your normal activities as told by your health care provider. Ask your health care provider what activities are safe for you. ? Avoid activities that make your symptoms worse.  Take brief periods of rest throughout the day. Resting in a lying or standing position is usually better than sitting to rest. ? When you rest for longer periods, mix in some mild activity or stretching between periods of rest. This will help to prevent stiffness and pain. ? Avoid sitting for long periods of time without moving. Get up and move around at least one time each hour.  Exercise and stretch regularly, as told by your health care provider.  Do not lift anything that is heavier than 10 lb (4.5 kg) while you have symptoms of sciatica. When you do not have symptoms, you should still avoid heavy lifting, especially repetitive heavy lifting.  When you lift objects, always use proper lifting technique, which includes: ? Bending your knees. ? Keeping the load close to your body. ? Avoiding twisting. General instructions  Use good posture. ? Avoid leaning forward while sitting. ? Avoid hunching over while standing.  Maintain a healthy weight. Excess weight puts extra stress on your back and makes it difficult to maintain good posture.  Wear supportive, comfortable shoes. Avoid wearing high heels.  Avoid sleeping on a mattress that is too soft or too hard. A mattress that is firm enough to support your back when you sleep may help to reduce your pain.  Keep all follow-up visits as told by your health care provider. This is important. Contact a health care provider if:  You have pain that wakes you up when you are sleeping.  You have pain that gets worse when you lie down.  Your pain is worse than you have experienced in the past.  Your pain lasts longer than 4 weeks.  You experience unexplained weight loss. Get help right away if:  You lose control  of your bowel or bladder (incontinence).  You have: ? Weakness in your lower back, pelvis, buttocks, or legs that gets worse. ? Redness or swelling of your back. ? A burning sensation when you urinate. This information is not intended to replace advice given to you by your health care provider. Make sure you discuss any questions you have with your health care provider. Document Released: 03/06/2001 Document Revised: 08/16/2015 Document Reviewed: 11/19/2014 Elsevier Interactive Patient Education  2018 [employer].  Sciatica Rehab Ask your health care provider which exercises are safe for you. Do exercises exactly as told by your health care provider and adjust them as directed. It is normal to feel mild stretching, pulling, tightness, or discomfort as you do these exercises, but you should stop right away if you feel sudden pain or your pain gets worse.Do not begin these exercises until told by your health care provider. Stretching and range of motion exercises These exercises warm up your muscles and joints and improve the movement  and flexibility of your hips and your back. These exercises also help to relieve pain, numbness, and tingling. Exercise A: Sciatic nerve glide 1. Sit in a chair with your head facing down toward your chest. Place your hands behind your back. Let your shoulders slump forward. 2. Slowly straighten one of your knees while you tilt your head back as if you are looking toward the ceiling. Only straighten your leg as far as you can without making your symptoms worse. 3. Hold for __________ seconds. 4. Slowly return to the starting position. 5. Repeat with your other leg. Repeat __________ times. Complete this exercise __________ times a day. Exercise B: Knee to chest with hip adduction and internal rotation  1. Lie on your back on a firm surface with both legs straight. 2. Bend one of your knees and move it up toward your chest until you feel a gentle stretch in your  lower back and buttock. Then, move your knee toward the shoulder that is on the opposite side from your leg. ? Hold your leg in this position by holding onto the front of your knee. 3. Hold for __________ seconds. 4. Slowly return to the starting position. 5. Repeat with your other leg. Repeat __________ times. Complete this exercise __________ times a day. Exercise C: Prone extension on elbows  1. Lie on your abdomen on a firm surface. A bed may be too soft for this exercise. 2. Prop yourself up on your elbows. 3. Use your arms to help lift your chest up until you feel a gentle stretch in your abdomen and your lower back. ? This will place some of your body weight on your elbows. If this is uncomfortable, try stacking pillows under your chest. ? Your hips should stay down, against the surface that you are lying on. Keep your hip and back muscles relaxed. 4. Hold for __________ seconds. 5. Slowly relax your upper body and return to the starting position. Repeat __________ times. Complete this exercise __________ times a day. Strengthening exercises These exercises build strength and endurance in your back. Endurance is the ability to use your muscles for a long time, even after they get tired. Exercise D: Pelvic tilt 1. Lie on your back on a firm surface. Bend your knees and keep your feet flat. 2. Tense your abdominal muscles. Tip your pelvis up toward the ceiling and flatten your lower back into the floor. ? To help with this exercise, you may place a small towel under your lower back and try to push your back into the towel. 3. Hold for __________ seconds. 4. Let your muscles relax completely before you repeat this exercise. Repeat __________ times. Complete this exercise __________ times a day. Exercise E: Alternating arm and leg raises  1. Get on your hands and knees on a firm surface. If you are on a hard floor, you may want to use padding to cushion your knees, such as an exercise  mat. 2. Line up your arms and legs. Your hands should be below your shoulders, and your knees should be below your hips. 3. Lift your left leg behind you. At the same time, raise your right arm and straighten it in front of you. ? Do not lift your leg higher than your hip. ? Do not lift your arm higher than your shoulder. ? Keep your abdominal and back muscles tight. ? Keep your hips facing the ground. ? Do not arch your back. ? Keep your balance carefully, and do not  hold your breath. 4. Hold for __________ seconds. 5. Slowly return to the starting position and repeat with your right leg and your left arm. Repeat __________ times. Complete this exercise __________ times a day. Posture and body mechanics  Body mechanics refers to the movements and positions of your body while you do your daily activities. Posture is part of body mechanics. Good posture and healthy body mechanics can help to relieve stress in your body's tissues and joints. Good posture means that your spine is in its natural S-curve position (your spine is neutral), your shoulders are pulled back slightly, and your head is not tipped forward. The following are general guidelines for applying improved posture and body mechanics to your everyday activities. Standing   When standing, keep your spine neutral and your feet about hip-width apart. Keep a slight bend in your knees. Your ears, shoulders, and hips should line up.  When you do a task in which you stand in one place for a long time, place one foot up on a stable object that is 2-4 inches (5-10 cm) high, such as a footstool. This helps keep your spine neutral. Sitting   When sitting, keep your spine neutral and keep your feet flat on the floor. Use a footrest, if necessary, and keep your thighs parallel to the floor. Avoid rounding your shoulders, and avoid tilting your head forward.  When working at a desk or a computer, keep your desk at a height where your hands are  slightly lower than your elbows. Slide your chair under your desk so you are close enough to maintain good posture.  When working at a computer, place your monitor at a height where you are looking straight ahead and you do not have to tilt your head forward or downward to look at the screen. Resting   When lying down and resting, avoid positions that are most painful for you.  If you have pain with activities such as sitting, bending, stooping, or squatting (flexion-based activities), lie in a position in which your body does not bend very much. For example, avoid curling up on your side with your arms and knees near your chest (fetal position).  If you have pain with activities such as standing for a long time or reaching with your arms (extension-based activities), lie with your spine in a neutral position and bend your knees slightly. Try the following positions: ? Lying on your side with a pillow between your knees. ? Lying on your back with a pillow under your knees. Lifting   When lifting objects, keep your feet at least shoulder-width apart and tighten your abdominal muscles.  Bend your knees and hips and keep your spine neutral. It is important to lift using the strength of your legs, not your back. Do not lock your knees straight out.  Always ask for help to lift heavy or awkward objects. This information is not intended to replace advice given to you by your health care provider. Make sure you discuss any questions you have with your health care provider. Document Released: 03/12/2005 Document Revised: 11/17/2015 Document Reviewed: 11/26/2014 Elsevier Interactive Patient Education  [employer].

## 2017-12-12 NOTE — Progress Notes (Signed)
Subjective:    Patient ID: Philip Cooley, male    DOB: 1965/05/20, 52 y.o.   MRN: [id number]  No chief complaint on file.   HPI Patient was seen today for ongoing concern.  Seen 8/1 for L sided back pain and L knee pain, given medrol dose pak and flexeril.  Back pain has resolved, now with pain in L buttock going down L posterior thigh.  Pt notes symptoms increased after prolonged sitting during a flight.  Pt does not keep his wallet in his back pocket.  Pt notes some relief with flexeril.  Denies loss of bowel or bladder, weakness in LEs, fever.  Past Medical History:  Diagnosis Date  . ACUTE GINGIVITIS NONPLAQUE INDUCED 09/07/2009  . ALLERGIC RHINITIS 06/17/2008  . Arthritis    hands and knees  . HYPERTENSION 06/17/2008    Allergies  Allergen Reactions  . Penicillins Swelling    ROS General: Denies fever, chills, night sweats, changes in weight, changes in appetite HEENT: Denies headaches, ear pain, changes in vision, rhinorrhea, sore throat CV: Denies CP, palpitations, SOB, orthopnea Pulm: Denies SOB, cough, wheezing GI: Denies abdominal pain, nausea, vomiting, diarrhea, constipation GU: Denies dysuria, hematuria, frequency, vaginal discharge Msk: Denies muscle cramps, joint pains  +pain in L butt into L posterior leg. Neuro: Denies weakness, numbness, tingling Skin: Denies rashes, bruising Psych: Denies depression, anxiety, hallucinations     Objective:    Blood pressure 128/90, pulse 84, temperature 98 F (36.7 C), temperature source Oral, weight 191 lb (86.6 kg), SpO2 98 %.   Gen. Pleasant, well-nourished, in no distress, normal affect   HEENT: [place]/AT, face symmetric, no scleral icterus, PERRLA, nares patent without drainage Lungs: no accessory muscle use Cardiovascular: RRR, no peripheral edema Musculoskeletal: No TTP of spine, paraspinal muscles, or sciatic nerve b/l.  No deformities, no cyanosis or clubbing, normal tone Neuro:  A&Ox3, CN II-XII intact, normal  gait  Wt Readings from Last 3 Encounters:  12/12/17 191 lb (86.6 kg)  11/12/17 187 lb (84.8 kg)  10/24/17 187 lb (84.8 kg)    Lab Results  Component Value Date   WBC 7.3 11/12/2017   HGB 17.9 (H) 11/12/2017   HCT 54.3 Repeated and verified X2. (H) 11/12/2017   PLT 206.0 11/12/2017   GLUCOSE 93 11/12/2017   CHOL 173 11/12/2017   TRIG 66.0 11/12/2017   HDL 55.40 11/12/2017   LDLCALC 104 (H) 11/12/2017   ALT 25 12/05/2015   AST 21 12/05/2015   NA 138 11/12/2017   K 4.1 11/12/2017   CL 100 11/12/2017   CREATININE 1.29 11/12/2017   BUN 13 11/12/2017   CO2 28 11/12/2017   TSH 0.90 12/05/2015   PSA 1.03 11/12/2017   HGBA1C 5.8 11/12/2017    Assessment/Plan:  Sciatica of left side  -discussed various causes and treatment options -given handout and stretching exercises. - Plan: cyclobenzaprine (FLEXERIL) 10 MG tablet  F/u prn  Grier Mitts, MD

## 2017-12-16 ENCOUNTER — Encounter: Payer: Self-pay | Admitting: Family Medicine

## 2017-12-27 ENCOUNTER — Ambulatory Visit (INDEPENDENT_AMBULATORY_CARE_PROVIDER_SITE_OTHER): Payer: BLUE CROSS/BLUE SHIELD | Admitting: Family Medicine

## 2017-12-27 ENCOUNTER — Encounter: Payer: Self-pay | Admitting: Family Medicine

## 2017-12-27 VITALS — BP 110/80 | HR 95 | Temp 98.0°F | Wt 189.6 lb

## 2017-12-27 DIAGNOSIS — M5432 Sciatica, left side: Secondary | ICD-10-CM | POA: Diagnosis not present

## 2017-12-27 MED ORDER — KETOROLAC TROMETHAMINE 60 MG/2ML IM SOLN
60.0000 mg | Freq: Once | INTRAMUSCULAR | Status: AC
  Administered 2017-12-27: 60 mg via INTRAMUSCULAR

## 2017-12-27 NOTE — Progress Notes (Signed)
Subjective:    Patient ID: Philip Cooley, male    DOB: 01-May-1965, 52 y.o.   MRN: [id number]  Chief Complaint  Patient presents with  . Follow-up  Pt accompanied by his wife.  HPI Patient was seen today for ongoing concern.  L sciatic pain started at the end of July and has continued.  Pain is ok when pt is at work as he is standing, but when he has to sit or start walking the pain is intense.  Pt has tried Medrol Dosepak, Flexeril,stretching, muscle roller, ice.  Pt denies numbness/weaknes in toes, loss of bowel or bladder.  Past Medical History:  Diagnosis Date  . ACUTE GINGIVITIS NONPLAQUE INDUCED 09/07/2009  . ALLERGIC RHINITIS 06/17/2008  . Arthritis    hands and knees  . HYPERTENSION 06/17/2008    Allergies  Allergen Reactions  . Penicillins Swelling    ROS General: Denies fever, chills, night sweats, changes in weight, changes in appetite HEENT: Denies headaches, ear pain, changes in vision, rhinorrhea, sore throat CV: Denies CP, palpitations, SOB, orthopnea Pulm: Denies SOB, cough, wheezing GI: Denies abdominal pain, nausea, vomiting, diarrhea, constipation GU: Denies dysuria, hematuria, frequency, vaginal discharge Msk: Denies muscle cramps, joint pains Neuro: Denies weakness, numbness, tingling  +L sciatic nerve pain Skin: Denies rashes, bruising Psych: Denies depression, anxiety, hallucinations     Objective:    Blood pressure 110/80, pulse 95, temperature 98 F (36.7 C), temperature source Oral, weight 189 lb 9.6 oz (86 kg), SpO2 96 %.   Gen. Pleasant, well-nourished, in no distress, normal affect   Lungs: no accessory muscle use Cardiovascular: RRR, no peripheral edema Musculoskeletal: no ttp of spine, paraspinal muscles. No deformities, no cyanosis or clubbing, normal tone Neuro:  A&Ox3, CN II-XII intact, normal gait   Wt Readings from Last 3 Encounters:  12/12/17 191 lb (86.6 kg)  11/12/17 187 lb (84.8 kg)  10/24/17 187 lb (84.8 kg)    Lab Results   Component Value Date   WBC 7.3 11/12/2017   HGB 17.9 (H) 11/12/2017   HCT 54.3 Repeated and verified X2. (H) 11/12/2017   PLT 206.0 11/12/2017   GLUCOSE 93 11/12/2017   CHOL 173 11/12/2017   TRIG 66.0 11/12/2017   HDL 55.40 11/12/2017   LDLCALC 104 (H) 11/12/2017   ALT 25 12/05/2015   AST 21 12/05/2015   NA 138 11/12/2017   K 4.1 11/12/2017   CL 100 11/12/2017   CREATININE 1.29 11/12/2017   BUN 13 11/12/2017   CO2 28 11/12/2017   TSH 0.90 12/05/2015   PSA 1.03 11/12/2017   HGBA1C 5.8 11/12/2017    Assessment/Plan:  Sciatica of left side  - Plan: ketorolac (TORADOL) injection 60 mg -pt tolerated injection well -discussed f/u with Ortho for piriformis injection -consider PT  F/u prn  Grier Mitts, MD

## 2017-12-30 ENCOUNTER — Telehealth: Payer: Self-pay | Admitting: Family Medicine

## 2017-12-30 NOTE — Telephone Encounter (Signed)
Copied from [place] [phone]. Topic: Referral - Request >> Dec 30, 2017  4:38 PM Philip Cooley wrote: Reason for CRM:   Pt states he was sent somewhere to get injection for sciatic nerve.  But when he got there he was told they only do these during normal hours and by referral only.  Pt would like a referral placed - he can't remember the name of where he was sent. Pt can be reached at [phone]

## 2018-01-01 ENCOUNTER — Other Ambulatory Visit: Payer: Self-pay | Admitting: Family Medicine

## 2018-01-01 ENCOUNTER — Telehealth: Payer: Self-pay | Admitting: Family Medicine

## 2018-01-01 MED ORDER — CYCLOBENZAPRINE HCL 10 MG PO TABS: 10.0000 mg | ORAL_TABLET | Freq: Three times a day (TID) | ORAL | 1 refills | Status: DC | PRN

## 2018-01-01 NOTE — Telephone Encounter (Signed)
Refill for flexeril sent to pharmacy.

## 2018-01-01 NOTE — Telephone Encounter (Signed)
Patient would like a call back about the referral for sciatic pain. He is currently waiting on a response from [facility] about a pain medication. He did not go to work today.

## 2018-01-01 NOTE — Telephone Encounter (Signed)
Pt called and would like to see if pain medication has been called in b/c he did not go to work today and would like to see if it can be called in to [facility] on [address].  Pt state he would like to try to go to work tomorrow.

## 2018-01-01 NOTE — Telephone Encounter (Signed)
Pt is taking Flexeril for his sciatica pain, pt is on Flexeril last filled on 12/12/2017 for 30 tablets with no refills, pt take it 3 times as needed. Please advise

## 2018-01-01 NOTE — Telephone Encounter (Signed)
Copied from [place] [id number]. Topic: General - Other >> Jan 01, 2018  8:09 AM Cecelia Byars, NT wrote: Reason for CRM: Patient called called and is requesting pain  medication for pain due to  sciatica of the left side please advise  [phone]

## 2018-01-02 ENCOUNTER — Other Ambulatory Visit: Payer: Self-pay | Admitting: Family Medicine

## 2018-01-02 DIAGNOSIS — M5432 Sciatica, left side: Secondary | ICD-10-CM

## 2018-01-02 NOTE — Telephone Encounter (Signed)
Called pt left a message that Rx for his muscle spasms was sent to pharmacy and a referral to Orthopedic has been placed

## 2018-01-02 NOTE — Telephone Encounter (Signed)
Pt is calling back checking on the status of pain medication

## 2018-01-10 ENCOUNTER — Encounter (INDEPENDENT_AMBULATORY_CARE_PROVIDER_SITE_OTHER): Payer: Self-pay | Admitting: Family Medicine

## 2018-01-10 ENCOUNTER — Ambulatory Visit (INDEPENDENT_AMBULATORY_CARE_PROVIDER_SITE_OTHER): Payer: BLUE CROSS/BLUE SHIELD | Admitting: Family Medicine

## 2018-01-10 DIAGNOSIS — M5432 Sciatica, left side: Secondary | ICD-10-CM | POA: Diagnosis not present

## 2018-01-10 MED ORDER — ETODOLAC 400 MG PO TABS: 400.0000 mg | ORAL_TABLET | Freq: Two times a day (BID) | ORAL | 3 refills | Status: DC | PRN

## 2018-01-10 MED ORDER — TIZANIDINE HCL 2 MG PO TABS: 2.0000 mg | ORAL_TABLET | Freq: Four times a day (QID) | ORAL | 1 refills | Status: DC | PRN

## 2018-01-10 NOTE — Progress Notes (Signed)
   Office Visit Note   Patient: Philip Cooley           Date of Birth: [birth year]           MRN: [id number] Visit Date: 01/10/2018 Requested by: Billie Ruddy, MD [place], [place] [address] PCP: Billie Ruddy, MD  Subjective: Chief Complaint  Patient presents with  . Lower Back - Pain    Low back pain, left-sided, in buttock radiating down posterior leg to calf area x over 1 month. Started after doing some strength/aerobic activities.  Pain is better while standing at work, worse with much sitting.    HPI: He is a 52 year old seen at the request of Dr. Volanda Napoleon for left-sided leg pain.  About a month ago he started having low back pain with no injury.  Left-sided pain which resolved after taking some oral steroid.  About a week later he started having left buttocks pain radiating down the left leg.  Occasional numbness and tingling in his foot.  He has not noticed any weakness.  Is worse when sitting or lying down, better when standing up and walking.  He cannot seem to make the pain go away.  No previous problems like this.  He is otherwise in good health, has well-controlled hypertension.              ROS: Otherwise noncontributory  Objective: Vital Signs: There were no vitals taken for this visit.  Physical Exam:  Back: No visible rash, no tenderness lumbar spinous processes.  No pain in the sciatic notch or SI joint.  Lower extremities strength is normal but he has absent left Achilles DTR, 2+ on the right.  Sensation is normal.  Imaging: None today.  Assessment & Plan: 1.  Left-sided sciatica with absent Achilles reflex, concerning for lumbar disc protrusion -Trial of home exercises and physical therapy, anti-inflammatory and muscle relaxants.  If not improving after couple weeks he will call and we will order x-rays and MRI scan.   Follow-Up Instructions: No follow-ups on file.       Procedures: None today.   PMFS History: Patient Active Problem  List   Diagnosis Date Noted  . ACUTE GINGIVITIS NONPLAQUE INDUCED 09/07/2009  . Essential hypertension 06/17/2008  . Allergic rhinitis 06/17/2008   Past Medical History:  Diagnosis Date  . ACUTE GINGIVITIS NONPLAQUE INDUCED 09/07/2009  . ALLERGIC RHINITIS 06/17/2008  . Arthritis    hands and knees  . HYPERTENSION 06/17/2008    Family History  Problem Relation Age of Onset  . Diabetes Mother   . Cancer Mother   . Colon cancer Neg Hx     Past Surgical History:  Procedure Laterality Date  . FINGER FRACTURE SURGERY    . MASS EXCISION Right 08/14/2016   Procedure: EXCISIONAL BIOPSY MASS RIGHT SMALL FINGER;  Surgeon: Leanora Cover, MD;  Location: [facility];  Service: Orthopedics;  Laterality: Right;   Social History   Occupational History  . Not on file  Tobacco Use  . Smoking status: Never Smoker  . Smokeless tobacco: Never Used  Substance and Sexual Activity  . Alcohol use: No  . Drug use: No  . Sexual activity: Not on file

## 2018-03-11 ENCOUNTER — Ambulatory Visit (INDEPENDENT_AMBULATORY_CARE_PROVIDER_SITE_OTHER): Payer: BLUE CROSS/BLUE SHIELD | Admitting: Family Medicine

## 2018-03-11 ENCOUNTER — Ambulatory Visit: Payer: Self-pay | Admitting: Family Medicine

## 2018-03-11 ENCOUNTER — Encounter (INDEPENDENT_AMBULATORY_CARE_PROVIDER_SITE_OTHER): Payer: Self-pay | Admitting: Family Medicine

## 2018-03-11 DIAGNOSIS — M5432 Sciatica, left side: Secondary | ICD-10-CM | POA: Diagnosis not present

## 2018-03-11 MED ORDER — BACLOFEN 10 MG PO TABS: 10.0000 mg | ORAL_TABLET | Freq: Three times a day (TID) | ORAL | 3 refills | Status: DC | PRN

## 2018-03-11 MED ORDER — KETOROLAC TROMETHAMINE 30 MG/ML IJ SOLN: 30.0000 mg | Freq: Once | INTRAMUSCULAR | Status: AC

## 2018-03-11 MED ORDER — METHYLPREDNISOLONE ACETATE 40 MG/ML IJ SUSP: 40.0000 mg | Freq: Once | INTRAMUSCULAR | Status: DC

## 2018-03-11 MED ORDER — DICLOFENAC SODIUM 75 MG PO TBEC: 75.0000 mg | DELAYED_RELEASE_TABLET | Freq: Two times a day (BID) | ORAL | 1 refills | Status: DC | PRN

## 2018-03-11 NOTE — Progress Notes (Signed)
Toradol 30 mg (1cc) given IM left dorsogluteal area and Depo Medrol 40 mg (1cc) given IM right dorsogluteal area per Dr. Virginia Rochester Daimen Shovlin,CMA(AAMA)

## 2018-03-11 NOTE — Progress Notes (Signed)
   Office Visit Note   Patient: Philip Cooley           Date of Birth: [birth year]           MRN: [id number] Visit Date: 03/11/2018 Requested by: Billie Ruddy, MD [place], [place] [address] PCP: Billie Ruddy, MD  Subjective: Chief Complaint  Patient presents with  . Lower Back - Pain    Pain left side of lower back since 03/08/18. Hurts to be in any position for long time  - has numbness down posterior leg to calf.  Today, had numbness down anterior thigh.    HPI: He is here with recurrent low back and left posterior hip pain.  Pain never went away completely but it was much better after last visit.  In the past few days he has had constant pain no matter what position he is in.  Very uncomfortable despite taking medications.  No bowel or bladder dysfunction, no definite weakness.              ROS: Noncontributory  Objective: Vital Signs: There were no vitals taken for this visit.  Physical Exam:  Low back: No visible rash.  No tenderness over the lumbar spinous processes or the SI joints.  Mild tenderness in the gluteus medius region but I cannot completely reproduce the pain by palpation.  Straight leg raise negative, lower extremity strength is normal.  He has 1+ left Achilles DTR and 2+ on the right.  2+ patellar DTRs.  Imaging: None today.  Assessment & Plan: 1.  Persistent low back and left leg pain, concerning for lumbar disc herniation -MRI to evaluate.  Steroid injection today for pain relief IM.  Toradol as well.  Possible epidural injection depending on MRI findings.   Follow-Up Instructions: No follow-ups on file.      Procedures: No procedures performed  No notes on file    PMFS History: Patient Active Problem List   Diagnosis Date Noted  . ACUTE GINGIVITIS NONPLAQUE INDUCED 09/07/2009  . Essential hypertension 06/17/2008  . Allergic rhinitis 06/17/2008   Past Medical History:  Diagnosis Date  . ACUTE GINGIVITIS NONPLAQUE  INDUCED 09/07/2009  . ALLERGIC RHINITIS 06/17/2008  . Arthritis    hands and knees  . HYPERTENSION 06/17/2008    Family History  Problem Relation Age of Onset  . Diabetes Mother   . Cancer Mother   . Colon cancer Neg Hx     Past Surgical History:  Procedure Laterality Date  . FINGER FRACTURE SURGERY    . MASS EXCISION Right 08/14/2016   Procedure: EXCISIONAL BIOPSY MASS RIGHT SMALL FINGER;  Surgeon: Leanora Cover, MD;  Location: [facility];  Service: Orthopedics;  Laterality: Right;   Social History   Occupational History  . Not on file  Tobacco Use  . Smoking status: Never Smoker  . Smokeless tobacco: Never Used  Substance and Sexual Activity  . Alcohol use: No  . Drug use: No  . Sexual activity: Not on file

## 2018-03-28 ENCOUNTER — Ambulatory Visit
Admission: RE | Admit: 2018-03-28 | Discharge: 2018-03-28 | Disposition: A | Discharge status: Home / Self Care | Payer: BLUE CROSS/BLUE SHIELD | Source: Ambulatory Visit | Attending: Family Medicine | Admitting: Family Medicine

## 2018-03-28 ENCOUNTER — Other Ambulatory Visit (INDEPENDENT_AMBULATORY_CARE_PROVIDER_SITE_OTHER): Payer: Self-pay | Admitting: Family Medicine

## 2018-03-28 ENCOUNTER — Telehealth (INDEPENDENT_AMBULATORY_CARE_PROVIDER_SITE_OTHER): Payer: Self-pay | Admitting: Family Medicine

## 2018-03-28 DIAGNOSIS — M5432 Sciatica, left side: Secondary | ICD-10-CM

## 2018-03-28 DIAGNOSIS — M545 Low back pain: Secondary | ICD-10-CM | POA: Diagnosis not present

## 2018-03-28 NOTE — Telephone Encounter (Signed)
Lumbar MRI scan is notable for the following:  The main finding is a moderate sized left sided disc herniation at L5-S1 resulting in severe narrowing of the nerve opening and probable compression of the S1 nerve.  If pain is still severe, could consider referral for an epidural steroid injection.  Pain is still present but tolerable, we could try physical therapy to see if they can get the disc back in place.  If all treatments fail, then we would consider surgical consultation.

## 2018-03-28 NOTE — Telephone Encounter (Signed)
I called and gave the MRI results to the patient.  He continues to have pain in the back, especially with bending over at the waist - the pain shoots down his leg.  He would like to proceed with an ESI first and then possibly PT after that.

## 2018-03-28 NOTE — Telephone Encounter (Signed)
Orders placed for referral to [facility].

## 2018-04-07 ENCOUNTER — Telehealth (INDEPENDENT_AMBULATORY_CARE_PROVIDER_SITE_OTHER): Payer: Self-pay | Admitting: Family Medicine

## 2018-04-07 DIAGNOSIS — M5432 Sciatica, left side: Secondary | ICD-10-CM

## 2018-04-07 NOTE — Telephone Encounter (Signed)
I advised the patient & gave him the phone number to [facility] so he can call them to schedule the appointment.  He will wait until tomorrow, so that the referral coordinator has time to fax the order to the facility.   I let him know I will inform Dr. Romona Curls assistant that he would like to put the [facility] on hold for now.

## 2018-04-07 NOTE — Telephone Encounter (Signed)
The patient would like to try PT now, instead of having the ESI first (as he had first requested). He knows of no particular place he would like to go for PT. Please advise.

## 2018-04-07 NOTE — Telephone Encounter (Signed)
Orders placed, please fax Rx.

## 2018-04-07 NOTE — Telephone Encounter (Signed)
Patient called asked for a call back concerning what he should do after his MRI. Patient asked if can get set up for (PT) The number to contact patient is [phone]

## 2018-04-29 DIAGNOSIS — M5432 Sciatica, left side: Secondary | ICD-10-CM | POA: Diagnosis not present

## 2018-05-08 DIAGNOSIS — M5432 Sciatica, left side: Secondary | ICD-10-CM | POA: Diagnosis not present

## 2018-05-14 DIAGNOSIS — M5432 Sciatica, left side: Secondary | ICD-10-CM | POA: Diagnosis not present

## 2018-05-21 DIAGNOSIS — M5432 Sciatica, left side: Secondary | ICD-10-CM | POA: Diagnosis not present

## 2018-05-28 ENCOUNTER — Encounter: Payer: Self-pay | Admitting: Family Medicine

## 2018-05-28 ENCOUNTER — Ambulatory Visit (INDEPENDENT_AMBULATORY_CARE_PROVIDER_SITE_OTHER): Payer: BLUE CROSS/BLUE SHIELD | Admitting: Family Medicine

## 2018-05-28 VITALS — BP 102/62 | HR 72 | Temp 98.2°F | Wt 195.0 lb

## 2018-05-28 DIAGNOSIS — G44209 Tension-type headache, unspecified, not intractable: Secondary | ICD-10-CM

## 2018-05-28 DIAGNOSIS — R0982 Postnasal drip: Secondary | ICD-10-CM | POA: Diagnosis not present

## 2018-05-28 NOTE — Progress Notes (Signed)
Subjective:    Patient ID: Philip Cooley, male    DOB: [birth year], 53 y.o.   MRN: [id number]  No chief complaint on file. Pt is accompanied by his daughter who is 7 months pregnant.  HPI Patient was seen today for ongoing concern.  Pt endorses HAs off and on x the last wk.  Pain noted as tension around head off and on.  Pt may wake up with a HA or develop one later during the day.  Pt denies fever, cough, ST, facial pain or pressure, ear pain or pressure.  Pt denies sick contacts.  Different from pt's migraines, though taking Excedrin migraine.  Pt drinking water all day.  Stopped drinking sodas 2 wks ago.  Was only drinking 1 soda per day.  Past Medical History:  Diagnosis Date  . ACUTE GINGIVITIS NONPLAQUE INDUCED 09/07/2009  . ALLERGIC RHINITIS 06/17/2008  . Arthritis    hands and knees  . HYPERTENSION 06/17/2008    Allergies  Allergen Reactions  . Penicillins Swelling    ROS General: Denies fever, chills, night sweats, changes in weight, changes in appetite HEENT: Denies ear pain, changes in vision, rhinorrhea, sore throat  +HAs CV: Denies CP, palpitations, SOB, orthopnea Pulm: Denies SOB, cough, wheezing GI: Denies abdominal pain, nausea, vomiting, diarrhea, constipation GU: Denies dysuria, hematuria, frequency, vaginal discharge Msk: Denies muscle cramps, joint pains Neuro: Denies weakness, numbness, tingling Skin: Denies rashes, bruising Psych: Denies depression, anxiety, hallucinations    Objective:    Blood pressure 102/62, pulse 72, temperature 98.2 F (36.8 C), temperature source Oral, weight 195 lb (88.5 kg), SpO2 97 %.  Gen. Pleasant, well-nourished, in no distress, normal affect  HEENT: [place]/AT, face symmetric, no scleral icterus, PERRLA, EOMI, nares patent without drainage, pharynx with postnasal drainage, no erythema or exudate.  TMs full b/l.  No cervical lymphadenopathy.  No carotid bruits Lungs: no accessory muscle use, CTAB, no wheezes or rales Cardiovascular:  RRR, no m/r/g, no peripheral edema Neuro:  A&Ox3, CN II-XII intact, normal gait Skin:  Warm, no lesions/ rash  Wt Readings from Last 3 Encounters:  05/28/18 195 lb (88.5 kg)  12/27/17 189 lb 9.6 oz (86 kg)  12/12/17 191 lb (86.6 kg)    Lab Results  Component Value Date   WBC 7.3 11/12/2017   HGB 17.9 (H) 11/12/2017   HCT 54.3 Repeated and verified X2. (H) 11/12/2017   PLT 206.0 11/12/2017   GLUCOSE 93 11/12/2017   CHOL 173 11/12/2017   TRIG 66.0 11/12/2017   HDL 55.40 11/12/2017   LDLCALC 104 (H) 11/12/2017   ALT 25 12/05/2015   AST 21 12/05/2015   NA 138 11/12/2017   K 4.1 11/12/2017   CL 100 11/12/2017   CREATININE 1.29 11/12/2017   BUN 13 11/12/2017   CO2 28 11/12/2017   TSH 0.90 12/05/2015   PSA 1.03 11/12/2017   HGBA1C 5.8 11/12/2017    Assessment/Plan:  Tension headache -discussed ways to reduce HAs -HAs less likely a/w HTN as not elevated this visit.  Pt encouraged to purchase a bp cuff for home monitoring. -given handout -Discussed using Tylenol PRN for headaches  Post-nasal drip -discussed restarting allergy medication, Zyrtec.  F/u prn  Grier Mitts, MD

## 2018-05-28 NOTE — Patient Instructions (Addendum)
Tension Headache, Adult A tension headache is a feeling of pain, pressure, or aching in the head that is often felt over the front and sides of the head. The pain can be dull, or it can feel tight (constricting). There are two types of tension headache:  Episodic tension headache. This is when the headaches happen fewer than 15 days a month.  Chronic tension headache. This is when the headaches happen more than 15 days a month during a 3-month period. A tension headache can last from 30 minutes to several days. It is the most common kind of headache. Tension headaches are not normally associated with nausea or vomiting, and they do not get worse with physical activity. What are the causes? The exact cause of this condition is not known. Tension headaches are often triggered by stress, anxiety, or depression. Other triggers include:  Alcohol.  Too much caffeine or caffeine withdrawal.  Respiratory infections, such as colds, flu, or sinus infections.  Dental problems or teeth clenching.  Tiredness (fatigue).  Holding your head and neck in the same position for a long period of time, such as while using a computer.  Smoking.  Arthritis of the neck. What are the signs or symptoms? Symptoms of this condition include:  A feeling of pressure or tightness around the head.  Dull, aching head pain.  Pain over the front and sides of the head.  Tenderness in the muscles of the head, neck, and shoulders. How is this diagnosed? This condition may be diagnosed based on your symptoms, your medical history, and a physical exam. If your symptoms are severe or unusual, you may have imaging tests, such as a CT scan or an MRI of your head. Your vision may also be checked. How is this treated? This condition may be treated with lifestyle changes and with medicines that help relieve symptoms. Follow these instructions at home: Managing pain  Take over-the-counter and prescription medicines only as  told by your health care provider.  When you have a headache, lie down in a dark, quiet room.  If directed, apply ice to the head and neck: ? Put ice in a plastic bag. ? Place a towel between your skin and the bag. ? Leave the ice on for 20 minutes, 2-3 times a day.  If directed, apply heat to the back of your neck as often as told by your health care provider. Use the heat source that your health care provider recommends, such as a moist heat pack or a heating pad. ? Place a towel between your skin and the heat source. ? Leave the heat on for 20-30 minutes. ? Remove the heat if your skin turns bright red. This is especially important if you are unable to feel pain, heat, or cold. You may have a greater risk of getting burned. Eating and drinking  Eat meals on a regular schedule.  Limit alcohol intake to no more than 1 drink a day for nonpregnant women and 2 drinks a day for men. One drink equals 12 oz of beer, 5 oz of wine, or 1 oz of hard liquor.  Drink enough fluid to keep your urine pale yellow.  Decrease your caffeine intake, or stop using caffeine. Lifestyle  Get 7-9 hours of sleep each night, or get the amount of sleep recommended by your health care provider.  At bedtime, remove all electronic devices from your room. Electronic devices include computers, phones, and tablets.  Find ways to manage your stress. Some things   that can help relieve stress include: ? Exercise. ? Deep breathing exercises. ? Yoga. ? Listening to music. ? Positive mental imagery.  Try to sit up straight and avoid tensing your muscles.  Do not use any products that contain nicotine or tobacco, such as cigarettes and e-cigarettes. If you need help quitting, ask your health care provider. General instructions   Keep all follow-up visits as told by your health care provider. This is important.  Avoid any headache triggers. Keep a headache journal to help find out what may trigger your headaches.  For example, write down: ? What you eat and drink. ? How much sleep you get. ? Any change to your diet or medicines. Contact a health care provider if:  Your headache does not get better.  Your headache comes back.  You are sensitive to sounds, light, or smells because of a headache.  You have nausea or you vomit.  Your stomach hurts. Get help right away if:  You suddenly develop a very severe headache along with any of the following: ? A stiff neck. ? Nausea and vomiting. ? Confusion. ? Weakness. ? Double vision or loss of vision. ? Shortness of breath. ? Rash. ? Unusual sleepiness. ? Fever. ? Trouble speaking. ? Pain in your eyes or ears. ? Trouble walking or balancing. ? Feeling faint or passing out. Summary  A tension headache is a feeling of pain, pressure, or aching in the head that is often felt over the front and sides of the head.  A tension headache can last from 30 minutes to several days. It is the most common kind of headache.  This condition may be diagnosed based on your symptoms, your medical history, and a physical exam.  This condition may be treated with lifestyle changes and with medicines that help relieve symptoms. This information is not intended to replace advice given to you by your health care provider. Make sure you discuss any questions you have with your health care provider. Document Released: 03/12/2005 Document Revised: 06/22/2016 Document Reviewed: 06/22/2016 Elsevier Interactive Patient Education  2019 [employer].  Allergies, Adult An allergy means that your body reacts to something that bothers it (allergen). It is not a normal reaction. This can happen from something that you:  Eat.  Breathe in.  Touch. You can have an allergy (be allergic) to:  Outdoor things, like: ? Pollen. ? Grass. ? Weeds.  Indoor things, like: ? Dust. ? Smoke. ? Pet dander.  Foods.  Medicines.  Things that bother your skin,  like: ? Detergents. ? Chemicals. ? Latex.  Perfume.  Bugs. An allergy cannot spread from person to person (is not contagious). Follow these instructions at home:         Stay away from things that you know you are allergic to.  If you have allergies to things in the air, wash out your nose each day. Do it with one of these: ? A salt-water (saline) spray. ? A container (neti pot).  Take over-the-counter and prescription medicines only as told by your doctor.  Keep all follow-up visits as told by your doctor. This is important.  If you are at risk for a very bad allergy reaction (anaphylaxis), keep an auto-injector with you all the time. This is called an epinephrine injection. ? This is pre-measured medicine with a needle. You can put it into your skin by yourself. ? Right after you have a very bad allergy reaction, you or a person with you must give  the medicine in less than a few minutes. This is an emergency.  If you have ever had a very bad allergy reaction, wear a medical alert bracelet or necklace. Your very bad allergy should be written on it. Contact a health care provider if:  Your symptoms do not get better with treatment. Get help right away if:  You have symptoms of a very bad allergy reaction. These include: ? A swollen mouth, tongue, or throat. ? Pain or tightness in your chest. ? Trouble breathing. ? Being short of breath. ? Dizziness. ? Fainting. ? Very bad pain in your belly (abdomen). ? Throwing up (vomiting). ? Watery poop (diarrhea). Summary  An allergy means that your body reacts to something that bothers it (allergen). It is not a normal reaction.  Stay away from things that make your body react.  Take over-the-counter and prescription medicines only as told by your doctor.  If you are at risk for a very bad allergy reaction, carry an auto-injector (epinephrine injection) all the time. Also, wear a medical alert bracelet or necklace so people  know about your allergy. This information is not intended to replace advice given to you by your health care provider. Make sure you discuss any questions you have with your health care provider. Document Released: 07/07/2012 Document Revised: 06/25/2016 Document Reviewed: 06/25/2016 Elsevier Interactive Patient Education  2019 Else[employer] Inc.  Postnasal Drip Postnasal drip is the feeling of mucus going down the back of your throat. Mucus is a slimy substance that moistens and cleans your nose and throat, as well as the air pockets in face bones near your forehead and cheeks (sinuses). Small amounts of mucus pass from your nose and sinuses down the back of your throat all the time. This is normal. When you produce too much mucus or the mucus gets too thick, you can feel it. Some common causes of postnasal drip include:  Having more mucus because of: ? A cold or the flu. ? Allergies. ? Cold air. ? Certain medicines.  Having more mucus that is thicker because of: ? A sinus or nasal infection. ? Dry air. ? A food allergy. Follow these instructions at home: Relieving discomfort   Gargle with a salt-water mixture 3-4 times a day or as needed. To make a salt-water mixture, completely dissolve -1 tsp of salt in 1 cup of warm water.  If the air in your home is dry, use a humidifier to add moisture to the air.  Use a saline spray or container (neti pot) to flush out the nose (nasal irrigation). These methods can help clear away mucus and keep the nasal passages moist. General instructions  Take over-the-counter and prescription medicines only as told by your health care provider.  Follow instructions from your health care provider about eating or drinking restrictions. You may need to avoid caffeine.  Avoid things that you know you are allergic to (allergens), like dust, mold, pollen, pets, or certain foods.  Drink enough fluid to keep your urine pale yellow.  Keep all follow-up visits  as told by your health care provider. This is important. Contact a health care provider if:  You have a fever.  You have a sore throat.  You have difficulty swallowing.  You have headache.  You have sinus pain.  You have a cough that does not go away.  The mucus from your nose becomes thick and is [place] or yellow in color.  You have cold or flu symptoms that last more  than 10 days. Summary  Postnasal drip is the feeling of mucus going down the back of your throat.  If your health care provider approves, use nasal irrigation or a nasal spray 2?4 times a day.  Avoid things that you know you are allergic to (allergens), like dust, mold, pollen, pets, or certain foods. This information is not intended to replace advice given to you by your health care provider. Make sure you discuss any questions you have with your health care provider. Document Released: 06/25/2016 Document Revised: 06/25/2016 Document Reviewed: 06/25/2016 Elsevier Interactive Patient Education  2019 [employer].

## 2018-06-04 DIAGNOSIS — M5432 Sciatica, left side: Secondary | ICD-10-CM | POA: Diagnosis not present

## 2018-07-28 ENCOUNTER — Encounter: Payer: Self-pay | Admitting: Family Medicine

## 2018-07-28 ENCOUNTER — Other Ambulatory Visit: Payer: Self-pay

## 2018-07-28 ENCOUNTER — Ambulatory Visit (INDEPENDENT_AMBULATORY_CARE_PROVIDER_SITE_OTHER): Payer: BLUE CROSS/BLUE SHIELD | Admitting: Family Medicine

## 2018-07-28 DIAGNOSIS — M545 Low back pain, unspecified: Secondary | ICD-10-CM

## 2018-07-28 MED ORDER — DICLOFENAC SODIUM 75 MG PO TBEC: 75.0000 mg | DELAYED_RELEASE_TABLET | Freq: Two times a day (BID) | ORAL | 1 refills | Status: DC | PRN

## 2018-07-28 MED ORDER — PREDNISONE 10 MG PO TABS: ORAL_TABLET | ORAL | 0 refills | Status: DC

## 2018-07-28 NOTE — Progress Notes (Signed)
Virtual Visit via Video Note  I connected with Philip Cooley on 07/28/18 at 11:30 AM EDT by a video enabled telemedicine application and verified that I am speaking with the correct person using two identifiers.  Location patient: home Location provider:work or home office Persons participating in the virtual visit: patient, provider  I discussed the limitations of evaluation and management by telemedicine and the availability of in person appointments. The patient expressed understanding and agreed to proceed.   HPI: Pt seen today for back pain.  While bending down looking for his car keys in the recliner, pt hurt his back 3 days ago.   Pt has a h/o bulging dics, seen by Dr. Junius Roads.  Pt went to PT.  Taking baclofen 10 mg, diclofenac 75 mg, taking hot baths, and resting since Saturday.  Pain is now a 6-7/10 with standing.  Prior to that was 10/10.   Also notes a thigtness in low back.  Denies radiation into legs, fever, chlils, HAs, dysuria, n/v, loss of bowel or bladder.   ROS: See pertinent positives and negatives per HPI.  Past Medical History:  Diagnosis Date  . ACUTE GINGIVITIS NONPLAQUE INDUCED 09/07/2009  . ALLERGIC RHINITIS 06/17/2008  . Arthritis    hands and knees  . HYPERTENSION 06/17/2008    Past Surgical History:  Procedure Laterality Date  . FINGER FRACTURE SURGERY    . MASS EXCISION Right 08/14/2016   Procedure: EXCISIONAL BIOPSY MASS RIGHT SMALL FINGER;  Surgeon: Leanora Cover, MD;  Location: [facility];  Service: Orthopedics;  Laterality: Right;    Family History  Problem Relation Age of Onset  . Diabetes Mother   . Cancer Mother   . Colon cancer Neg Hx     SOCIAL HX: Pt's daughter is pregnant.  She is due any day now.  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   Current Outpatient Medications:  .  amLODipine-benazepril (LOTREL) 5-10 MG capsule, Take 1 capsule by mouth daily., Disp: 90 capsule, Rfl: 3 .   baclofen (LIORESAL) 10 MG tablet, Take 1 tablet (10 mg total) by mouth 3 (three) times daily as needed for muscle spasms., Disp: 30 each, Rfl: 3 .  cetirizine (ZYRTEC) 10 MG tablet, Take 10 mg by mouth daily., Disp: , Rfl:  .  diclofenac (VOLTAREN) 75 MG EC tablet, Take 1 tablet (75 mg total) by mouth 2 (two) times daily as needed., Disp: 60 tablet, Rfl: 1 .  etodolac (LODINE) 400 MG tablet, Take 1 tablet (400 mg total) by mouth 2 (two) times daily as needed., Disp: 60 tablet, Rfl: 3 .  Multiple Vitamin (MULTIVITAMIN WITH MINERALS) TABS, Take 1 tablet by mouth daily., Disp: , Rfl:  .  tiZANidine (ZANAFLEX) 2 MG tablet, Take 1-2 tablets (2-4 mg total) by mouth every 6 (six) hours as needed for muscle spasms., Disp: 60 tablet, Rfl: 1 .  vitamin C (ASCORBIC ACID) 500 MG tablet, Take 500 mg by mouth daily., Disp: , Rfl:   Current Facility-Administered Medications:  .  methylPREDNISolone acetate (DEPO-MEDROL) injection 40 mg, 40 mg, Intramuscular, Once, Hilts, Michael, MD  EXAM:  VITALS per patient if applicable:  RR between 12-20 bpm  GENERAL: alert, oriented, appears well and in no acute distress  HEENT: atraumatic, conjunctiva clear, no obvious abnormalities on inspection of external nose and ears  NECK: normal movements of the head and neck  LUNGS: on inspection no signs of respiratory distress, breathing rate appears normal, no obvious  gross SOB, gasping or wheezing  CV: no obvious cyanosis  MS: moves all visible extremities without noticeable abnormality.  Discomfort in low back with walking, flexion and extension of spine.  PSYCH/NEURO: pleasant and cooperative, no obvious depression or anxiety, speech and thought processing grossly intact  ASSESSMENT AND PLAN:  Discussed the following assessment and plan:  Acute bilateral low back pain without sciatica  -muscle strain vs worsened disc extrusion at L5-S1 -ok to continue heat, massage, NSAIDs, exercises as tolerated -discussed  f/u with Dr. Junius Roads, Ortho for continued symptoms - Plan: diclofenac (VOLTAREN) 75 MG EC tablet, predniSONE (DELTASONE) 10 MG tablet -will provide with note for work. -f/u in next few days to 1 wk      I discussed the assessment and treatment plan with the patient. The patient was provided an opportunity to ask questions and all were answered. The patient agreed with the plan and demonstrated an understanding of the instructions.   The patient was advised to call back or seek an in-person evaluation if the symptoms worsen or if the condition fails to improve as anticipated.   Billie Ruddy, MD

## 2018-07-31 ENCOUNTER — Telehealth: Payer: Self-pay | Admitting: *Deleted

## 2018-07-31 NOTE — Telephone Encounter (Signed)
Work excuse note sent to pt e mail address per dr Volanda Napoleon

## 2018-07-31 NOTE — Telephone Encounter (Signed)
He would like this emailed like last time  [email or web address]

## 2018-07-31 NOTE — Telephone Encounter (Signed)
Please advise 

## 2018-07-31 NOTE — Telephone Encounter (Signed)
Ok to extended work note until Monday.

## 2018-07-31 NOTE — Telephone Encounter (Signed)
Copied from [place] [id number]. Topic: General - Inquiry >> Jul 30, 2018  4:12 PM Kyle, [place] D wrote: Reason for CRM: Pt stated he is still having back pain and would like to ask for a note extending him out of work until Monday. Pt stated he just started the steroid medication today and has one full day of medication. Please advise.

## 2018-08-01 ENCOUNTER — Telehealth: Payer: Self-pay | Admitting: Family Medicine

## 2018-08-01 NOTE — Telephone Encounter (Signed)
[employer] Short Term Disability form to be filled out.  Placed in Dr's folder.  Call [phone] upon completion.

## 2018-08-01 NOTE — Telephone Encounter (Signed)
Pt short term disability forms have been faxed to Dr Volanda Napoleon for completion.

## 2018-08-05 NOTE — Telephone Encounter (Signed)
Patient called to inquire about paper work that was left to be completed by Dr Volanda Napoleon. He would like a call back when they are completed please. Ph# [phone]

## 2018-08-06 NOTE — Telephone Encounter (Signed)
Spoke with pt voiced understanding that the office will contact him soon as the forms are ready

## 2018-08-07 NOTE — Telephone Encounter (Signed)
Spoke with pt aware to pick up his Short term disability form in the office.

## 2018-09-29 ENCOUNTER — Other Ambulatory Visit: Payer: Self-pay | Admitting: Family Medicine

## 2018-09-29 ENCOUNTER — Telehealth: Payer: Self-pay | Admitting: Family Medicine

## 2018-09-29 NOTE — Telephone Encounter (Signed)
Medication Refill - Medication: amLODipine-benazepril (LOTREL) 5-10 MG capsule   Has the patient contacted their pharmacy? Yes.   (Agent: If no, request that the patient contact the pharmacy for the refill.) (Agent: If yes, when and what did the pharmacy advise?)  Preferred Pharmacy (with phone number or street name):  [facility] ([address]), [place] [facility] [place] [phone] (Phone) [phone] (Fax)     Agent: Please be advised that RX refills may take up to 3 business days. We ask that you follow-up with your pharmacy.

## 2018-09-29 NOTE — Telephone Encounter (Signed)
Rx sent 

## 2018-10-21 ENCOUNTER — Ambulatory Visit (INDEPENDENT_AMBULATORY_CARE_PROVIDER_SITE_OTHER): Payer: BLUE CROSS/BLUE SHIELD | Admitting: Family Medicine

## 2018-10-21 ENCOUNTER — Encounter: Payer: Self-pay | Admitting: Family Medicine

## 2018-10-21 ENCOUNTER — Other Ambulatory Visit: Payer: Self-pay

## 2018-10-21 DIAGNOSIS — Z20828 Contact with and (suspected) exposure to other viral communicable diseases: Secondary | ICD-10-CM

## 2018-10-21 DIAGNOSIS — Z20822 Contact with and (suspected) exposure to covid-19: Secondary | ICD-10-CM

## 2018-10-21 DIAGNOSIS — R51 Headache: Secondary | ICD-10-CM | POA: Diagnosis not present

## 2018-10-21 DIAGNOSIS — J22 Unspecified acute lower respiratory infection: Secondary | ICD-10-CM

## 2018-10-21 DIAGNOSIS — R42 Dizziness and giddiness: Secondary | ICD-10-CM

## 2018-10-21 MED ORDER — HYDROCODONE-HOMATROPINE 5-1.5 MG/5ML PO SYRP: 5.0000 mL | ORAL_SOLUTION | Freq: Three times a day (TID) | ORAL | 0 refills | Status: DC | PRN

## 2018-10-21 NOTE — Progress Notes (Signed)
Virtual Visit via Video Note  I connected with Philip Cooley on 10/21/18 at  4:30 PM EDT by a video enabled telemedicine application and verified that I am speaking with the correct person using two identifiers.  Location patient: home Location provider:work or home office Persons participating in the virtual visit: patient, provider  I discussed the limitations of evaluation and management by telemedicine and the availability of in person appointments. The patient expressed understanding and agreed to proceed.   HPI: Pt with chest congestion, lightheadedness, HA, an annoying CP like indigestion that does not linger, and burning in throat in am x a few days.  Pt notes his throat is dry in the am.  Pt hears gurgling and wheezing in R side of chest at night if lays on that side.  Using vicks vapor nyquil and a wellness blend vitamin.  Denies fever, ear pain, facial pain or pressure, SOB, or sick contacts.  Pt contacted prior to appt by this provider to have COVID testing.  Pt went to testing site, however they closed early 2/2 weather concerns.  ROS: See pertinent positives and negatives per HPI.  Past Medical History:  Diagnosis Date  . ACUTE GINGIVITIS NONPLAQUE INDUCED 09/07/2009  . ALLERGIC RHINITIS 06/17/2008  . Arthritis    hands and knees  . HYPERTENSION 06/17/2008    Past Surgical History:  Procedure Laterality Date  . FINGER FRACTURE SURGERY    . MASS EXCISION Right 08/14/2016   Procedure: EXCISIONAL BIOPSY MASS RIGHT SMALL FINGER;  Surgeon: Leanora Cover, MD;  Location: [facility];  Service: Orthopedics;  Laterality: Right;    Family History  Problem Relation Age of Onset  . Diabetes Mother   . Cancer Mother   . Colon cancer Neg Hx      Current Outpatient Medications:  .  amLODipine-benazepril (LOTREL) 5-10 MG capsule, Take 1 capsule by mouth once daily, Disp: 90 capsule, Rfl: 0 .  baclofen (LIORESAL) 10 MG tablet, Take 1 tablet (10 mg total) by mouth 3  (three) times daily as needed for muscle spasms., Disp: 30 each, Rfl: 3 .  cetirizine (ZYRTEC) 10 MG tablet, Take 10 mg by mouth daily., Disp: , Rfl:  .  diclofenac (VOLTAREN) 75 MG EC tablet, Take 1 tablet (75 mg total) by mouth 2 (two) times daily as needed., Disp: 60 tablet, Rfl: 1 .  etodolac (LODINE) 400 MG tablet, Take 1 tablet (400 mg total) by mouth 2 (two) times daily as needed., Disp: 60 tablet, Rfl: 3 .  Multiple Vitamin (MULTIVITAMIN WITH MINERALS) TABS, Take 1 tablet by mouth daily., Disp: , Rfl:  .  predniSONE (DELTASONE) 10 MG tablet, Take 4 tabs every morning for 3 days, 3 tabs for 2 days, 2 tabs for 2 days, 1 tab for 1 day., Disp: 23 tablet, Rfl: 0 .  tiZANidine (ZANAFLEX) 2 MG tablet, Take 1-2 tablets (2-4 mg total) by mouth every 6 (six) hours as needed for muscle spasms., Disp: 60 tablet, Rfl: 1 .  vitamin C (ASCORBIC ACID) 500 MG tablet, Take 500 mg by mouth daily., Disp: , Rfl:   Current Facility-Administered Medications:  .  methylPREDNISolone acetate (DEPO-MEDROL) injection 40 mg, 40 mg, Intramuscular, Once, Hilts, Michael, MD  EXAMTonette Bihari per patient if applicable:  RR between 12-20 bpm  GENERAL: alert, oriented, appears well and in no acute distress  HEENT: atraumatic, conjunctiva clear, no obvious abnormalities on inspection of external nose and ears  NECK: normal movements of the head and neck  LUNGS: on inspection no signs of respiratory distress, breathing rate appears normal, no obvious gross SOB, gasping or wheezing  CV: no obvious cyanosis  MS: moves all visible extremities without noticeable abnormality  PSYCH/NEURO: pleasant and cooperative, no obvious depression or anxiety, speech and thought processing grossly intact  ASSESSMENT AND PLAN:  Discussed the following assessment and plan:  Acute lower respiratory infection  -discussed likely 2/2 viral etiology however cannot r/o COVID-19.  Also consider pneumonia, bronchitis. -continue supportive  care -given precautions.  For worsening symptoms obtain CXR and consider starting abx. - Plan: HYDROcodone-homatropine (HYCODAN) 5-1.5 MG/5ML syrup  Suspected Covid-19 Virus Infection -pt will have testing done tomorrow 10/22/18 as testing site closed early today 2/2 weather concerns.  - Plan: Novel Coronavirus, NAA ([facility]) -will provide note for work if needed  F/u prn   I discussed the assessment and treatment plan with the patient. The patient was provided an opportunity to ask questions and all were answered. The patient agreed with the plan and demonstrated an understanding of the instructions.   The patient was advised to call back or seek an in-person evaluation if the symptoms worsen or if the condition fails to improve as anticipated.   Billie Ruddy, MD

## 2018-10-22 ENCOUNTER — Other Ambulatory Visit: Payer: Self-pay | Admitting: Family Medicine

## 2018-10-22 DIAGNOSIS — Z20822 Contact with and (suspected) exposure to covid-19: Secondary | ICD-10-CM

## 2018-10-22 DIAGNOSIS — R6889 Other general symptoms and signs: Secondary | ICD-10-CM | POA: Diagnosis not present

## 2018-10-24 LAB — NOVEL CORONAVIRUS, NAA: SARS-CoV-2, NAA: NOT DETECTED

## 2018-11-05 ENCOUNTER — Ambulatory Visit: Payer: Self-pay | Admitting: *Deleted

## 2018-11-05 NOTE — Telephone Encounter (Signed)
Patient experiencing constipation while taking  Hycodan for a cough. LBM this morning. No other related issues. Care Advice included increasing water intake daily/OTC stool softener and fresh fruits daily. Reports having chest congestion coughing up scant amount of phlegm. Take Mucinex OTC along with increasing daily water intake. Call back if no improvement. Stated he understood.  Reason for Disposition . Mild constipation  Answer Assessment - Initial Assessment Questions 1. STOOL PATTERN OR FREQUENCY: "How often do you pass bowel movements (BMs)?"  (Normal range: tid to q 3 days)  "When was the last BM passed?"      This morning 2. STRAINING: "Do you have to strain to have a BM?"     slightly 3. RECTAL PAIN: "Does your rectum hurt when the stool comes out?" If so, ask: "Do you have hemorrhoids? How bad is the pain?"  (Scale 1-10; or mild, moderate, severe)      4. STOOL COMPOSITION: "Are the stools hard?"      yes 5. BLOOD ON STOOLS: "Has there been any blood on the toilet tissue or on the surface of the BM?" If so, ask: "When was the last time?"      no 6. CHRONIC CONSTIPATION: "Is this a new problem for you?"  If no, ask: "How long have you had this problem?" (days, weeks, months)      Yes, taking hycodan 7. CHANGES IN DIET: "Have there been any recent changes in your diet?"      no 8. MEDICATIONS: "Have you been taking any new medications?"     hycodan 9. LAXATIVES: "Have you been using any laxatives or enemas?"  If yes, ask "What, how often, and when was the last time?"     no 10. CAUSE: "What do you think is causing the constipation?"         11. OTHER SYMPTOMS: "Do you have any other symptoms?" (e.g., abdominal pain, fever, vomiting)       no 12. PREGNANCY: "Is there any chance you are pregnant?" "When was your last menstrual period?"       na  Protocols used: CONSTIPATION-A-AH

## 2018-11-11 ENCOUNTER — Other Ambulatory Visit: Payer: Self-pay

## 2018-11-11 ENCOUNTER — Ambulatory Visit (INDEPENDENT_AMBULATORY_CARE_PROVIDER_SITE_OTHER): Payer: BLUE CROSS/BLUE SHIELD | Admitting: Family Medicine

## 2018-11-11 ENCOUNTER — Encounter: Payer: Self-pay | Admitting: Family Medicine

## 2018-11-11 DIAGNOSIS — R05 Cough: Secondary | ICD-10-CM

## 2018-11-11 DIAGNOSIS — J302 Other seasonal allergic rhinitis: Secondary | ICD-10-CM

## 2018-11-11 DIAGNOSIS — R059 Cough, unspecified: Secondary | ICD-10-CM

## 2018-11-11 DIAGNOSIS — M546 Pain in thoracic spine: Secondary | ICD-10-CM

## 2018-11-11 MED ORDER — PREDNISONE 10 MG PO TABS: ORAL_TABLET | ORAL | 0 refills | Status: DC

## 2018-11-11 NOTE — Progress Notes (Signed)
Virtual Visit via Video Note  I connected with Philip Cooley on 11/11/18 at  4:00 PM EDT by a video enabled telemedicine application 2/2 [id number] pandemic and verified that I am speaking with the correct person using two identifiers.  Location patient: home Location provider:work or home office Persons participating in the virtual visit: patient, provider  I discussed the limitations of evaluation and management by telemedicine and the availability of in person appointments. The patient expressed understanding and agreed to proceed.   HPI: Pt with continued cough.  States feeling can be more of a tickle/need to clear throat.  Taking cetirizine for allergies, not sure it is helping as still has watery eyes.  Per med list review pt taking lotrel 5-10 mg daily.  Pt denies acid reflux.  States called the office to report hycodan was causing constipation.  Has since increased po intake of water which helps.    Pt also notes he was trying to lift his son yesterday and started having pain in upper back.  Pt notes a soreness in thoracic area of back.     ROS: See pertinent positives and negatives per HPI.  Past Medical History:  Diagnosis Date  . ACUTE GINGIVITIS NONPLAQUE INDUCED 09/07/2009  . ALLERGIC RHINITIS 06/17/2008  . Arthritis    hands and knees  . HYPERTENSION 06/17/2008    Past Surgical History:  Procedure Laterality Date  . FINGER FRACTURE SURGERY    . MASS EXCISION Right 08/14/2016   Procedure: EXCISIONAL BIOPSY MASS RIGHT SMALL FINGER;  Surgeon: Leanora Cover, MD;  Location: [facility];  Service: Orthopedics;  Laterality: Right;    Family History  Problem Relation Age of Onset  . Diabetes Mother   . Cancer Mother   . Colon cancer Neg Hx      Current Outpatient Medications:  .  amLODipine-benazepril (LOTREL) 5-10 MG capsule, Take 1 capsule by mouth once daily, Disp: 90 capsule, Rfl: 0 .  baclofen (LIORESAL) 10 MG tablet, Take 1 tablet (10 mg total) by mouth 3  (three) times daily as needed for muscle spasms., Disp: 30 each, Rfl: 3 .  cetirizine (ZYRTEC) 10 MG tablet, Take 10 mg by mouth daily., Disp: , Rfl:  .  diclofenac (VOLTAREN) 75 MG EC tablet, Take 1 tablet (75 mg total) by mouth 2 (two) times daily as needed., Disp: 60 tablet, Rfl: 1 .  etodolac (LODINE) 400 MG tablet, Take 1 tablet (400 mg total) by mouth 2 (two) times daily as needed., Disp: 60 tablet, Rfl: 3 .  HYDROcodone-homatropine (HYCODAN) 5-1.5 MG/5ML syrup, Take 5 mLs by mouth every 8 (eight) hours as needed for cough., Disp: 120 mL, Rfl: 0 .  Multiple Vitamin (MULTIVITAMIN WITH MINERALS) TABS, Take 1 tablet by mouth daily., Disp: , Rfl:  .  predniSONE (DELTASONE) 10 MG tablet, Take 4 tabs every morning for 3 days, 3 tabs for 2 days, 2 tabs for 2 days, 1 tab for 1 day., Disp: 23 tablet, Rfl: 0 .  tiZANidine (ZANAFLEX) 2 MG tablet, Take 1-2 tablets (2-4 mg total) by mouth every 6 (six) hours as needed for muscle spasms., Disp: 60 tablet, Rfl: 1 .  vitamin C (ASCORBIC ACID) 500 MG tablet, Take 500 mg by mouth daily., Disp: , Rfl:   Current Facility-Administered Medications:  .  methylPREDNISolone acetate (DEPO-MEDROL) injection 40 mg, 40 mg, Intramuscular, Once, Hilts, Michael, MD  EXAMTonette Bihari per patient if applicable:  GENERAL: alert, oriented, appears well and in no acute distress  HEENT:  atraumatic, conjunctiva clear, no obvious abnormalities on inspection of external nose and ears  NECK: normal movements of the head and neck  LUNGS: on inspection no signs of respiratory distress, breathing rate appears normal, no obvious gross SOB, gasping or wheezing  CV: no obvious cyanosis  MS: moves all visible extremities without noticeable abnormality  PSYCH/NEURO: pleasant and cooperative, no obvious depression or anxiety, speech and thought processing grossly intact  ASSESSMENT AND PLAN:  Discussed the following assessment and plan:  Acute midline thoracic back pain  -  Plan: predniSONE (DELTASONE) 10 MG tablet  Cough  -discussed possible causes includind ACE-I, allergies, gerd, viral etiology -will start Allegra to see if allergy symptoms improve. -discussed d/c'ing benazepril 10 mg, however pt just picked up a new rx for lotrel 5-10.  When pt is almost finishes with current rx will then prescribe amlodipine 10 mg and d/c benazepril to see if cough improves.  Seasonal allergies -discussed changing allergy meds as has been on cetirizine x yrs. -pt will start Allegra -pt to continue flonase  F/u in 1 month, sooner if needed.  I discussed the assessment and treatment plan with the patient. The patient was provided an opportunity to ask questions and all were answered. The patient agreed with the plan and demonstrated an understanding of the instructions.   The patient was advised to call back or seek an in-person evaluation if the symptoms worsen or if the condition fails to improve as anticipated.   Billie Ruddy, MD

## 2018-11-14 ENCOUNTER — Telehealth: Payer: Self-pay

## 2018-11-14 NOTE — Telephone Encounter (Signed)
Copied from [place] [id number]. Topic: Quick Communication - See Telephone Encounter >> Nov 13, 2018  9:51 AM Loma Boston wrote: CRM for notification. See Telephone encounter for: 11/13/18. Pt need a doc note to be out of work from last Tuesday Volanda Napoleon) when he came in 8/18 till 8/26 returning.  Pls e-mail [email or web address] >> Nov 13, 2018 10:31 AM Cox, Melburn Hake, CMA wrote: We cannot email anything out. Pt can print via My Chart, have mailed, or pick up.

## 2018-11-17 NOTE — Telephone Encounter (Signed)
Pt called for an update on the doctor;s note that he requested to return back to work. Pt requests call back.

## 2018-11-17 NOTE — Telephone Encounter (Signed)
Ok for note 

## 2018-11-18 ENCOUNTER — Encounter: Payer: Self-pay | Admitting: Family Medicine

## 2018-11-20 NOTE — Telephone Encounter (Signed)
Pt work note sent thru MyChart portal, pt aware

## 2018-11-21 ENCOUNTER — Telehealth: Payer: Self-pay | Admitting: Family Medicine

## 2018-11-21 NOTE — Telephone Encounter (Signed)
Short-term disability form to be filled out-placed in dr's folder.  Call [phone] upon completion.

## 2018-11-21 NOTE — Telephone Encounter (Signed)
Work note is ready, pt picked up from the office this afternoon

## 2018-11-26 NOTE — Telephone Encounter (Signed)
Form has been received and has been placed in Dr Volanda Napoleon desk for completing

## 2018-11-28 NOTE — Telephone Encounter (Signed)
Pt wants to know when he can pick up paperwork.

## 2018-11-28 NOTE — Telephone Encounter (Signed)
Spoke with pt advised that the office will call him when his Short Term Disability paperwork is ready

## 2018-12-03 NOTE — Telephone Encounter (Signed)
Spoke with pt aware to pick up disability forms from the office.

## 2018-12-04 NOTE — Telephone Encounter (Signed)
Wife has picked up paperwork.

## 2018-12-04 NOTE — Telephone Encounter (Signed)
Patient called to say that his wife will be coming to pick up paper work for him.

## 2018-12-04 NOTE — Telephone Encounter (Signed)
Noted  

## 2018-12-04 NOTE — Telephone Encounter (Signed)
pts wife came in and picked up the [place] form that was completed by Dr. Volanda Napoleon and there was not a charge.

## 2018-12-10 ENCOUNTER — Telehealth: Payer: Self-pay

## 2018-12-10 NOTE — Telephone Encounter (Signed)
  Pt has been scheduled for a virtual with Dr Volanda Napoleon on 12/19/2018 at 4 pm       Copied from [place] [id number]. Topic: Appointment Scheduling - Scheduling Inquiry for Clinic >> Dec 05, 2018 11:24 AM Reyne Dumas L wrote: Reason for CRM:   Pt states he has had a cough for a month and is now getting headaches because of the cough.  Pt wants to have an appointment next week to have a chest x-ray done to make sure this isn't anything serious. >> Dec 10, 2018 11:17 AM Lennox Solders wrote: Pt is calling back and still waiting on return call for an appt. Please call pt. I called office 3x no answer

## 2018-12-15 ENCOUNTER — Telehealth: Payer: Self-pay | Admitting: Family Medicine

## 2018-12-15 NOTE — Telephone Encounter (Signed)
Short Term Disability Claim form to be filled out.  This is a repeat form because the first form filled out by Dr Volanda Napoleon had 2019 dates on it.  Patient had to bring back new forms to be filled out again.

## 2018-12-16 NOTE — Telephone Encounter (Signed)
Patient called to ask if the office received the papers that he just faxed over.  Please call patient to confirm.  CB# [phone]

## 2018-12-19 ENCOUNTER — Ambulatory Visit (INDEPENDENT_AMBULATORY_CARE_PROVIDER_SITE_OTHER): Payer: BLUE CROSS/BLUE SHIELD

## 2018-12-19 ENCOUNTER — Telehealth (INDEPENDENT_AMBULATORY_CARE_PROVIDER_SITE_OTHER): Payer: BLUE CROSS/BLUE SHIELD | Admitting: Family Medicine

## 2018-12-19 ENCOUNTER — Other Ambulatory Visit: Payer: Self-pay

## 2018-12-19 DIAGNOSIS — Z8739 Personal history of other diseases of the musculoskeletal system and connective tissue: Secondary | ICD-10-CM | POA: Insufficient documentation

## 2018-12-19 DIAGNOSIS — R059 Cough, unspecified: Secondary | ICD-10-CM

## 2018-12-19 DIAGNOSIS — I10 Essential (primary) hypertension: Secondary | ICD-10-CM | POA: Diagnosis not present

## 2018-12-19 DIAGNOSIS — R05 Cough: Secondary | ICD-10-CM

## 2018-12-19 DIAGNOSIS — Z87828 Personal history of other (healed) physical injury and trauma: Secondary | ICD-10-CM | POA: Insufficient documentation

## 2018-12-19 MED ORDER — AMLODIPINE BESYLATE 10 MG PO TABS: 10.0000 mg | ORAL_TABLET | Freq: Every day | ORAL | 5 refills | Status: DC

## 2018-12-19 NOTE — Telephone Encounter (Signed)
Form is at the front office cabinet

## 2018-12-19 NOTE — Progress Notes (Signed)
Virtual Visit via Video Note  I connected with Philip Cooley on 12/19/18 at  4:00 PM EDT by a video enabled telemedicine application 2/2 [id number] pandemic and verified that I am speaking with the correct person using two identifiers.  Location patient: home Location provider:work or home office Persons participating in the virtual visit: patient, provider  I discussed the limitations of evaluation and management by telemedicine and the availability of in person appointments. The patient expressed understanding and agreed to proceed.   HPI: Pt with dry cough x 2 + months.  Pt has tried cetirizine, allegra, flonase, and hycodan which caused constipation.  Pt was given a steroid pack in for his cough while in [place].  States had some improvement but the cough returned.   ROS: See pertinent positives and negatives per HPI.  Past Medical History:  Diagnosis Date  . ACUTE GINGIVITIS NONPLAQUE INDUCED 09/07/2009  . ALLERGIC RHINITIS 06/17/2008  . Arthritis    hands and knees  . HYPERTENSION 06/17/2008    Past Surgical History:  Procedure Laterality Date  . FINGER FRACTURE SURGERY    . MASS EXCISION Right 08/14/2016   Procedure: EXCISIONAL BIOPSY MASS RIGHT SMALL FINGER;  Surgeon: Leanora Cover, MD;  Location: [facility];  Service: Orthopedics;  Laterality: Right;    Family History  Problem Relation Age of Onset  . Diabetes Mother   . Cancer Mother   . Colon cancer Neg Hx     Current Outpatient Medications:  .  amLODipine-benazepril (LOTREL) 5-10 MG capsule, Take 1 capsule by mouth once daily, Disp: 90 capsule, Rfl: 0 .  baclofen (LIORESAL) 10 MG tablet, Take 1 tablet (10 mg total) by mouth 3 (three) times daily as needed for muscle spasms., Disp: 30 each, Rfl: 3 .  cetirizine (ZYRTEC) 10 MG tablet, Take 10 mg by mouth daily., Disp: , Rfl:  .  diclofenac (VOLTAREN) 75 MG EC tablet, Take 1 tablet (75 mg total) by mouth 2 (two) times daily as needed., Disp: 60 tablet, Rfl:  1 .  etodolac (LODINE) 400 MG tablet, Take 1 tablet (400 mg total) by mouth 2 (two) times daily as needed., Disp: 60 tablet, Rfl: 3 .  HYDROcodone-homatropine (HYCODAN) 5-1.5 MG/5ML syrup, Take 5 mLs by mouth every 8 (eight) hours as needed for cough., Disp: 120 mL, Rfl: 0 .  Multiple Vitamin (MULTIVITAMIN WITH MINERALS) TABS, Take 1 tablet by mouth daily., Disp: , Rfl:  .  predniSONE (DELTASONE) 10 MG tablet, Take 4 tabs every morning for 3 days, 3 tabs for 2 days, 2 tabs for 2 days, 1 tab for 1 day., Disp: 23 tablet, Rfl: 0 .  tiZANidine (ZANAFLEX) 2 MG tablet, Take 1-2 tablets (2-4 mg total) by mouth every 6 (six) hours as needed for muscle spasms., Disp: 60 tablet, Rfl: 1 .  vitamin C (ASCORBIC ACID) 500 MG tablet, Take 500 mg by mouth daily., Disp: , Rfl:   Current Facility-Administered Medications:  .  methylPREDNISolone acetate (DEPO-MEDROL) injection 40 mg, 40 mg, Intramuscular, Once, Hilts, Michael, MD  EXAM:  VITALS per patient if applicable:  GENERAL: alert, oriented, appears well and in no acute distress  HEENT: atraumatic, conjunctiva clear, no obvious abnormalities on inspection of external nose and ears  NECK: normal movements of the head and neck  LUNGS: on inspection no signs of respiratory distress, breathing rate appears normal, no obvious gross SOB, gasping or wheezing  CV: no obvious cyanosis  MS: moves all visible extremities without noticeable abnormality  PSYCH/NEURO: pleasant  and cooperative, no obvious depression or anxiety, speech and thought processing grossly intact  ASSESSMENT AND PLAN:  Discussed the following assessment and plan:  Cough  -given duration consider ACE-I, bronchitis, seasonal allergies-though less likely as tried numerous allergy meds, GERD -Will d/c benazepril - Plan: DG Chest 2 View  Essential hypertension  -controlled -given concern for continued dry cough will d/c benazepril 10 mg -will increase norvasc from 5 mg to 10 mg -pt  to monitor bp.  If needed will add additional bp med - Plan: amLODipine (NORVASC) 10 MG tablet  F/u in 2 wks   I discussed the assessment and treatment plan with the patient. The patient was provided an opportunity to ask questions and all were answered. The patient agreed with the plan and demonstrated an understanding of the instructions.   The patient was advised to call back or seek an in-person evaluation if the symptoms worsen or if the condition fails to improve as anticipated.   Billie Ruddy, MD

## 2018-12-19 NOTE — Telephone Encounter (Signed)
Spoke with pt aware to pick up his form in the office before 5 pm today.

## 2018-12-22 ENCOUNTER — Telehealth: Payer: Self-pay

## 2018-12-22 NOTE — Telephone Encounter (Signed)
Spoke with pt aware that he will get a call from the office soon as Dr Volanda Napoleon reviews the x ray reults     Copied from [place] [id number]. Topic: General - Other >> Dec 22, 2018  1:42 PM Yvette Rack wrote: Reason for CRM: Pt called for x-ray results. Pt requests call back.

## 2018-12-23 ENCOUNTER — Other Ambulatory Visit: Payer: Self-pay | Admitting: Family Medicine

## 2018-12-23 DIAGNOSIS — J4540 Moderate persistent asthma, uncomplicated: Secondary | ICD-10-CM

## 2018-12-23 MED ORDER — BECLOMETHASONE DIPROPIONATE 40 MCG/ACT IN AERS: 1.0000 | INHALATION_SPRAY | Freq: Two times a day (BID) | RESPIRATORY_TRACT | 3 refills | Status: DC

## 2018-12-23 MED ORDER — ALBUTEROL SULFATE HFA 108 (90 BASE) MCG/ACT IN AERS: 2.0000 | INHALATION_SPRAY | Freq: Four times a day (QID) | RESPIRATORY_TRACT | 3 refills | Status: DC | PRN

## 2018-12-31 ENCOUNTER — Telehealth (INDEPENDENT_AMBULATORY_CARE_PROVIDER_SITE_OTHER): Payer: BLUE CROSS/BLUE SHIELD | Admitting: Family Medicine

## 2018-12-31 ENCOUNTER — Other Ambulatory Visit: Payer: Self-pay

## 2018-12-31 DIAGNOSIS — R05 Cough: Secondary | ICD-10-CM | POA: Diagnosis not present

## 2018-12-31 DIAGNOSIS — R059 Cough, unspecified: Secondary | ICD-10-CM

## 2018-12-31 NOTE — Progress Notes (Signed)
Virtual Visit via Video Note  I connected with Philip Cooley on 12/31/18 at  2:00 PM EDT by a video enabled telemedicine application 2/2 [id number] pandemic and verified that I am speaking with the correct person using two identifiers.  Location patient: home Location provider:work or home office Persons participating in the virtual visit: patient, provider  I discussed the limitations of evaluation and management by telemedicine and the availability of in person appointments. The patient expressed understanding and agreed to proceed.   HPI: Pt is a 53 yo male with pmh sig for allergies, HTN, h/o arhtritis, h/o back pain.  Pt seen for continued dry cough since having a URI 09/2018. Cough and chest congestion increase in the evening and at night.  Pt tried hycodan-caused constipation, cetirizine, allegra, flonase, steroids.  COVID test negative 10/22/18. Pt was on lotrel (Norvasc-benazepril) 5-10 mg which was d/c'd.  Pt continued on norvasc 10 mg.  Denies GERD symptoms.  CXR with mild bronchial thickening, seen with bronchitis or asthma.  Started on albuterol and qvar 9/29 without improvement.   ROS: See pertinent positives and negatives per HPI.  Past Medical History:  Diagnosis Date  . ACUTE GINGIVITIS NONPLAQUE INDUCED 09/07/2009  . ALLERGIC RHINITIS 06/17/2008  . Arthritis    hands and knees  . HYPERTENSION 06/17/2008    Past Surgical History:  Procedure Laterality Date  . FINGER FRACTURE SURGERY    . MASS EXCISION Right 08/14/2016   Procedure: EXCISIONAL BIOPSY MASS RIGHT SMALL FINGER;  Surgeon: Leanora Cover, MD;  Location: [facility];  Service: Orthopedics;  Laterality: Right;    Family History  Problem Relation Age of Onset  . Diabetes Mother   . Cancer Mother   . Colon cancer Neg Hx       Current Outpatient Medications:  .  albuterol (VENTOLIN HFA) 108 (90 Base) MCG/ACT inhaler, Inhale 2 puffs into the lungs every 6 (six) hours as needed for wheezing or  shortness of breath., Disp: 8 g, Rfl: 3 .  amLODipine (NORVASC) 10 MG tablet, Take 1 tablet (10 mg total) by mouth daily., Disp: 30 tablet, Rfl: 5 .  baclofen (LIORESAL) 10 MG tablet, Take 1 tablet (10 mg total) by mouth 3 (three) times daily as needed for muscle spasms., Disp: 30 each, Rfl: 3 .  beclomethasone (QVAR) 40 MCG/ACT inhaler, Inhale 1 puff into the lungs 2 (two) times daily., Disp: 1 Inhaler, Rfl: 3 .  cetirizine (ZYRTEC) 10 MG tablet, Take 10 mg by mouth daily., Disp: , Rfl:  .  diclofenac (VOLTAREN) 75 MG EC tablet, Take 1 tablet (75 mg total) by mouth 2 (two) times daily as needed., Disp: 60 tablet, Rfl: 1 .  etodolac (LODINE) 400 MG tablet, Take 1 tablet (400 mg total) by mouth 2 (two) times daily as needed., Disp: 60 tablet, Rfl: 3 .  HYDROcodone-homatropine (HYCODAN) 5-1.5 MG/5ML syrup, Take 5 mLs by mouth every 8 (eight) hours as needed for cough., Disp: 120 mL, Rfl: 0 .  Multiple Vitamin (MULTIVITAMIN WITH MINERALS) TABS, Take 1 tablet by mouth daily., Disp: , Rfl:  .  predniSONE (DELTASONE) 10 MG tablet, Take 4 tabs every morning for 3 days, 3 tabs for 2 days, 2 tabs for 2 days, 1 tab for 1 day., Disp: 23 tablet, Rfl: 0 .  tiZANidine (ZANAFLEX) 2 MG tablet, Take 1-2 tablets (2-4 mg total) by mouth every 6 (six) hours as needed for muscle spasms., Disp: 60 tablet, Rfl: 1 .  vitamin C (ASCORBIC ACID) 500 MG  tablet, Take 500 mg by mouth daily., Disp: , Rfl:   Current Facility-Administered Medications:  .  methylPREDNISolone acetate (DEPO-MEDROL) injection 40 mg, 40 mg, Intramuscular, Once, Hilts, Michael, MD  EXAM:  VITALS per patient if applicable:  GENERAL: alert, oriented, appears well and in no acute distress  HEENT: atraumatic, conjunctiva clear, no obvious abnormalities on inspection of external nose and ears  NECK: normal movements of the head and neck  LUNGS: Intermittent dry cough, increases with continued conversation.  on inspection no signs of respiratory  distress, breathing rate appears normal, no obvious gross SOB, gasping or wheezing  CV: no obvious cyanosis  MS: moves all visible extremities without noticeable abnormality  PSYCH/NEURO: pleasant and cooperative, no obvious depression or anxiety, speech and thought processing grossly intact  ASSESSMENT AND PLAN:  Discussed the following assessment and plan:  Cough  -continued despite OTC allergy meds, albuterol and Qvar inhalers, prednisone, d/c of ACE-I -CXR with mild bronchial thickening, seen with bronchitis or asthma 12/19/18 -consider a trial of PPI for silent reflux -will place referral for PFTs - Plan: Ambulatory referral to Pulmonology -given precautions  F/u prn   I discussed the assessment and treatment plan with the patient. The patient was provided an opportunity to ask questions and all were answered. The patient agreed with the plan and demonstrated an understanding of the instructions.   The patient was advised to call back or seek an in-person evaluation if the symptoms worsen or if the condition fails to improve as anticipated.   Billie Ruddy, MD

## 2019-01-12 DIAGNOSIS — Z1159 Encounter for screening for other viral diseases: Secondary | ICD-10-CM | POA: Diagnosis not present

## 2019-01-12 DIAGNOSIS — K219 Gastro-esophageal reflux disease without esophagitis: Secondary | ICD-10-CM | POA: Diagnosis not present

## 2019-01-12 DIAGNOSIS — R942 Abnormal results of pulmonary function studies: Secondary | ICD-10-CM | POA: Diagnosis not present

## 2019-01-12 DIAGNOSIS — I10 Essential (primary) hypertension: Secondary | ICD-10-CM | POA: Diagnosis not present

## 2019-01-12 DIAGNOSIS — J9801 Acute bronchospasm: Secondary | ICD-10-CM | POA: Diagnosis not present

## 2019-01-12 DIAGNOSIS — R0602 Shortness of breath: Secondary | ICD-10-CM | POA: Diagnosis not present

## 2019-01-28 DIAGNOSIS — R0602 Shortness of breath: Secondary | ICD-10-CM | POA: Diagnosis not present

## 2019-01-28 DIAGNOSIS — Z7189 Other specified counseling: Secondary | ICD-10-CM | POA: Diagnosis not present

## 2019-01-28 DIAGNOSIS — R05 Cough: Secondary | ICD-10-CM | POA: Diagnosis not present

## 2019-01-28 DIAGNOSIS — Z9109 Other allergy status, other than to drugs and biological substances: Secondary | ICD-10-CM | POA: Diagnosis not present

## 2019-02-04 ENCOUNTER — Encounter: Payer: Self-pay | Admitting: Family Medicine

## 2019-02-04 ENCOUNTER — Other Ambulatory Visit: Payer: Self-pay

## 2019-02-04 ENCOUNTER — Ambulatory Visit (INDEPENDENT_AMBULATORY_CARE_PROVIDER_SITE_OTHER): Payer: BLUE CROSS/BLUE SHIELD | Admitting: Family Medicine

## 2019-02-04 ENCOUNTER — Ambulatory Visit (INDEPENDENT_AMBULATORY_CARE_PROVIDER_SITE_OTHER): Payer: BLUE CROSS/BLUE SHIELD

## 2019-02-04 VITALS — BP 130/90 | HR 82 | Temp 97.1°F | Wt 201.0 lb

## 2019-02-04 DIAGNOSIS — M25562 Pain in left knee: Secondary | ICD-10-CM

## 2019-02-04 NOTE — Patient Instructions (Addendum)
You can use Voltaren gel for your knee pain.  Acute Knee Pain, Adult Acute knee pain is sudden and may be caused by damage, swelling, or irritation of the muscles and tissues that support your knee. The injury may result from:  A fall.  An injury to your knee from twisting motions.  A hit to the knee.  Infection. Acute knee pain may go away on its own with time and rest. If it does not, your health care provider may order tests to find the cause of the pain. These may include:  Imaging tests, such as an X-ray, MRI, or ultrasound.  Joint aspiration. In this test, fluid is removed from the knee.  Arthroscopy. In this test, a lighted tube is inserted into the knee and an image is projected onto a TV screen.  Biopsy. In this test, a sample of tissue is removed from the body and studied under a microscope. Follow these instructions at home: Pay attention to any changes in your symptoms. Take these actions to relieve your pain. If you have a knee sleeve or brace:   Wear the sleeve or brace as told by your health care provider. Remove it only as told by your health care provider.  Loosen the sleeve or brace if your toes tingle, become numb, or turn cold and blue.  Keep the sleeve or brace clean.  If the sleeve or brace is not waterproof: ? Do not let it get wet. ? Cover it with a watertight covering when you take a bath or shower. Activity  Rest your knee.  Do not do things that cause pain or make pain worse.  Avoid high-impact activities or exercises, such as running, jumping rope, or doing jumping jacks.  Work with a physical therapist to make a safe exercise program, as recommended by your health care provider. Do exercises as told by your physical therapist. Managing pain, stiffness, and swelling   If directed, put ice on the knee: ? Put ice in a plastic bag. ? Place a towel between your skin and the bag. ? Leave the ice on for 20 minutes, 2-3 times a day.  If  directed, use an elastic bandage to put pressure (compression) on your injured knee. This may control swelling, give support, and help with discomfort. General instructions  Take over-the-counter and prescription medicines only as told by your health care provider.  Raise (elevate) your knee above the level of your heart when you are sitting or lying down.  Sleep with a pillow under your knee.  Do not use any products that contain nicotine or tobacco, such as cigarettes, e-cigarettes, and chewing tobacco. These can delay healing. If you need help quitting, ask your health care provider.  If you are overweight, work with your health care provider and a dietitian to set a weight-loss goal that is healthy and reasonable for you. Extra weight can put pressure on your knee.  Keep all follow-up visits as told by your health care provider. This is important. Contact a health care provider if:  Your knee pain continues, changes, or gets worse.  You have a fever along with knee pain.  Your knee feels warm to the touch.  Your knee buckles or locks up. Get help right away if:  Your knee swells, and the swelling becomes worse.  You cannot move your knee.  You have severe pain in your knee. Summary  Acute knee pain can be caused by a fall, an injury, an infection, or damage,  swelling, or irritation of the tissues that support your knee.  Your health care provider may perform tests to find out the cause of the pain.  Pay attention to any changes in your symptoms. Relieve your pain with rest, medicines, light activity, and use of ice.  Get help if your pain continues or becomes worse, your knee swells, or you cannot move your knee. This information is not intended to replace advice given to you by your health care provider. Make sure you discuss any questions you have with your health care provider. Document Released: 01/07/2007 Document Revised: 08/22/2017 Document Reviewed: 08/22/2017  Elsevier Patient Education  2020 [employer].

## 2019-02-04 NOTE — Progress Notes (Signed)
Subjective:    Patient ID: Philip Cooley, male    DOB: 11-Apr-1965, 53 y.o.   MRN: [id number]  No chief complaint on file.   HPI Patient was seen today for acute concern of L knee pain.  Pt notes pain x 2-3 wks with mild edema. interfering with sleep when pt lays on his side.  May be worse with rainy weather.  At times knees pop.  Denies recent injury, falls, hearing any clicks or tears.  Endorses knee pain years ago.  Has not tried anything for his symptoms.  Past Medical History:  Diagnosis Date  . ACUTE GINGIVITIS NONPLAQUE INDUCED 09/07/2009  . ALLERGIC RHINITIS 06/17/2008  . Arthritis    hands and knees  . HYPERTENSION 06/17/2008    Allergies  Allergen Reactions  . Penicillins Swelling    ROS General: Denies fever, chills, night sweats, changes in weight, changes in appetite HEENT: Denies headaches, ear pain, changes in vision, rhinorrhea, sore throat CV: Denies CP, palpitations, SOB, orthopnea Pulm: Denies SOB, cough, wheezing GI: Denies abdominal pain, nausea, vomiting, diarrhea, constipation GU: Denies dysuria, hematuria, frequency, vaginal discharge Msk: Denies muscle cramps, joint pains  +R knee pain Neuro: Denies weakness, numbness, tingling Skin: Denies rashes, bruising Psych: Denies depression, anxiety, hallucinations      Objective:    Blood pressure 130/90, pulse 82, temperature (!) 97.1 F (36.2 C), temperature source Oral, weight 201 lb (91.2 kg), SpO2 97 %.   Gen. Pleasant, well-nourished, in no distress, normal affect   HEENT: [place]/AT, face symmetric, wearing glasses, no scleral icterus, PERRLA, EOMI, nares patent without drainage Lungs: no accessory muscle use Cardiovascular: RRR, no peripheral edema.  Popliteal pulses present b/l. Musculoskeletal: Mild edema of medial L knee/medial distal L quad.   No TTP, crepitus, erythema, or effusion of b/l knees.  Negative anterior drawer and Lachman signs of bilateral knees.  Normal patellar tracking.   no cyanosis  or clubbing, normal tone Neuro:  A&Ox3, CN II-XII intact, normal gait Skin:  Warm, no lesions/ rash   Wt Readings from Last 3 Encounters:  02/04/19 201 lb (91.2 kg)  05/28/18 195 lb (88.5 kg)  12/27/17 189 lb 9.6 oz (86 kg)    Lab Results  Component Value Date   WBC 7.3 11/12/2017   HGB 17.9 (H) 11/12/2017   HCT 54.3 Repeated and verified X2. (H) 11/12/2017   PLT 206.0 11/12/2017   GLUCOSE 93 11/12/2017   CHOL 173 11/12/2017   TRIG 66.0 11/12/2017   HDL 55.40 11/12/2017   LDLCALC 104 (H) 11/12/2017   ALT 25 12/05/2015   AST 21 12/05/2015   NA 138 11/12/2017   K 4.1 11/12/2017   CL 100 11/12/2017   CREATININE 1.29 11/12/2017   BUN 13 11/12/2017   CO2 28 11/12/2017   TSH 0.90 12/05/2015   PSA 1.03 11/12/2017   HGBA1C 5.8 11/12/2017    Assessment/Plan:  Acute pain of left knee  -supportive care: ice, rest, compression, NSAIDs -consider OTC voltaren gel prn -will give further recommendations based on imaging. - Plan: DG Knee Complete 4 Views Left, DG Knee Complete 4 Views Left  F/u prn  Grier Mitts, MD

## 2019-03-25 ENCOUNTER — Telehealth (INDEPENDENT_AMBULATORY_CARE_PROVIDER_SITE_OTHER): Payer: BLUE CROSS/BLUE SHIELD | Admitting: Family Medicine

## 2019-03-25 DIAGNOSIS — M545 Low back pain, unspecified: Secondary | ICD-10-CM

## 2019-03-25 MED ORDER — PREDNISONE 10 MG PO TABS: ORAL_TABLET | ORAL | 0 refills | Status: DC

## 2019-03-25 MED ORDER — TIZANIDINE HCL 2 MG PO TABS: 2.0000 mg | ORAL_TABLET | Freq: Four times a day (QID) | ORAL | 1 refills | Status: DC | PRN

## 2019-03-25 NOTE — Progress Notes (Signed)
Virtual Visit via Video Note  I connected with Philip Cooley on 03/25/19 at  4:30 PM EST by a video enabled telemedicine application 2/2 [id number] pandemic and verified that I am speaking with the correct person using two identifiers.  Location patient: home Location provider:work or home office Persons participating in the virtual visit: patient, provider  I discussed the limitations of evaluation and management by telemedicine and the availability of in person appointments. The patient expressed understanding and agreed to proceed.   HPI: Pt is a 53 yo male with pmh sig for HTN, allergies, chronic lumbar muscle spasms.  Pt notes back becoming increasingly stiff over the last few wks. Pain in bottom and sides of lumbar spine.  Pain does not radiate into LEs.  Pt denies injury.  Stretching some at home.  Was in PT, but has not been back 2/2 COVID-19 pandemic.  Last episode of back pain was in August 2020.  ROS: See pertinent positives and negatives per HPI.  Past Medical History:  Diagnosis Date  . ACUTE GINGIVITIS NONPLAQUE INDUCED 09/07/2009  . ALLERGIC RHINITIS 06/17/2008  . Arthritis    hands and knees  . HYPERTENSION 06/17/2008    Past Surgical History:  Procedure Laterality Date  . FINGER FRACTURE SURGERY    . MASS EXCISION Right 08/14/2016   Procedure: EXCISIONAL BIOPSY MASS RIGHT SMALL FINGER;  Surgeon: Leanora Cover, MD;  Location: [facility];  Service: Orthopedics;  Laterality: Right;    Family History  Problem Relation Age of Onset  . Diabetes Mother   . Cancer Mother   . Colon cancer Neg Hx     Current Outpatient Medications:  .  albuterol (VENTOLIN HFA) 108 (90 Base) MCG/ACT inhaler, Inhale 2 puffs into the lungs every 6 (six) hours as needed for wheezing or shortness of breath., Disp: 8 g, Rfl: 3 .  amLODipine (NORVASC) 10 MG tablet, Take 1 tablet (10 mg total) by mouth daily., Disp: 30 tablet, Rfl: 5 .  baclofen (LIORESAL) 10 MG tablet, Take 1 tablet  (10 mg total) by mouth 3 (three) times daily as needed for muscle spasms., Disp: 30 each, Rfl: 3 .  beclomethasone (QVAR) 40 MCG/ACT inhaler, Inhale 1 puff into the lungs 2 (two) times daily., Disp: 1 Inhaler, Rfl: 3 .  cetirizine (ZYRTEC) 10 MG tablet, Take 10 mg by mouth daily., Disp: , Rfl:  .  diclofenac (VOLTAREN) 75 MG EC tablet, Take 1 tablet (75 mg total) by mouth 2 (two) times daily as needed., Disp: 60 tablet, Rfl: 1 .  etodolac (LODINE) 400 MG tablet, Take 1 tablet (400 mg total) by mouth 2 (two) times daily as needed., Disp: 60 tablet, Rfl: 3 .  HYDROcodone-homatropine (HYCODAN) 5-1.5 MG/5ML syrup, Take 5 mLs by mouth every 8 (eight) hours as needed for cough., Disp: 120 mL, Rfl: 0 .  Multiple Vitamin (MULTIVITAMIN WITH MINERALS) TABS, Take 1 tablet by mouth daily., Disp: , Rfl:  .  predniSONE (DELTASONE) 10 MG tablet, Take 4 tabs every morning for 3 days, 3 tabs for 2 days, 2 tabs for 2 days, 1 tab for 1 day., Disp: 23 tablet, Rfl: 0 .  tiZANidine (ZANAFLEX) 2 MG tablet, Take 1-2 tablets (2-4 mg total) by mouth every 6 (six) hours as needed for muscle spasms., Disp: 60 tablet, Rfl: 1 .  vitamin C (ASCORBIC ACID) 500 MG tablet, Take 500 mg by mouth daily., Disp: , Rfl:   Current Facility-Administered Medications:  .  methylPREDNISolone acetate (DEPO-MEDROL) injection 40 mg,  40 mg, Intramuscular, Once, Hilts, Michael, MD  EXAM:  VITALS per patient if applicable:  RR between 12-20 bpm  GENERAL: alert, oriented, appears well and in no acute distress  HEENT: atraumatic, conjunctiva clear, no obvious abnormalities on inspection of external nose and ears  NECK: normal movements of the head and neck  LUNGS: on inspection no signs of respiratory distress, breathing rate appears normal, no obvious gross SOB, gasping or wheezing  CV: no obvious cyanosis  MS: moves all visible extremities without noticeable abnormality  PSYCH/NEURO: pleasant and cooperative, no obvious depression or  anxiety, speech and thought processing grossly intact  ASSESSMENT AND PLAN:  Discussed the following assessment and plan:  Acute bilateral low back pain without sciatica  -discussed supportive care: stretching, heat, massage - Plan: predniSONE (DELTASONE) 10 MG tablet, tiZANidine (ZANAFLEX) 2 MG tablet -given note for work -given precautions  F/u prn for worsened or continued symptoms   I discussed the assessment and treatment plan with the patient. The patient was provided an opportunity to ask questions and all were answered. The patient agreed with the plan and demonstrated an understanding of the instructions.   The patient was advised to call back or seek an in-person evaluation if the symptoms worsen or if the condition fails to improve as anticipated.   Billie Ruddy, MD

## 2019-06-18 ENCOUNTER — Telehealth: Payer: Self-pay | Admitting: Family Medicine

## 2019-06-18 NOTE — Telephone Encounter (Signed)
Medication Refill: Amlodipine  Pharmacy: [facility] [place]: [phone]

## 2019-06-19 ENCOUNTER — Other Ambulatory Visit: Payer: Self-pay

## 2019-06-19 DIAGNOSIS — I10 Essential (primary) hypertension: Secondary | ICD-10-CM

## 2019-06-19 MED ORDER — AMLODIPINE BESYLATE 10 MG PO TABS: 10.0000 mg | ORAL_TABLET | Freq: Every day | ORAL | 5 refills | Status: DC

## 2019-06-19 NOTE — Telephone Encounter (Signed)
Refill sent.

## 2019-11-05 ENCOUNTER — Ambulatory Visit
Admission: EM | Admit: 2019-11-05 | Discharge: 2019-11-05 | Disposition: A | Discharge status: Home / Self Care | Payer: BLUE CROSS/BLUE SHIELD | Attending: Physician Assistant | Admitting: Physician Assistant

## 2019-11-05 ENCOUNTER — Other Ambulatory Visit: Payer: Self-pay

## 2019-11-05 DIAGNOSIS — H6983 Other specified disorders of Eustachian tube, bilateral: Secondary | ICD-10-CM

## 2019-11-05 DIAGNOSIS — R42 Dizziness and giddiness: Secondary | ICD-10-CM

## 2019-11-05 MED ORDER — ONDANSETRON 4 MG PO TBDP: 4.0000 mg | ORAL_TABLET | Freq: Three times a day (TID) | ORAL | 0 refills | Status: DC | PRN

## 2019-11-05 MED ORDER — MECLIZINE HCL 12.5 MG PO TABS: 12.5000 mg | ORAL_TABLET | Freq: Three times a day (TID) | ORAL | 0 refills | Status: DC | PRN

## 2019-11-05 MED ORDER — FLUTICASONE PROPIONATE 50 MCG/ACT NA SUSP: 2.0000 | Freq: Every day | NASAL | 0 refills | Status: DC

## 2019-11-05 NOTE — Discharge Instructions (Signed)
No alarming signs on exam. Meclizine for dizziness as needed. Zofran for nausea/vomiting as needed. Flonase for ear pressure seen on exam. Keep hydrated, urine should be clear to pale yellow in color. Follow up with PCP if symptoms not improving. If noticing worsening symptoms, vomiting not controlled by medicine, vision changes, passing out, confusion, go to the ED for further evaluation.

## 2019-11-05 NOTE — ED Triage Notes (Signed)
Pt states woke up this am with dizziness on movement. Denies headache, chest pain, or SOB.

## 2019-11-05 NOTE — ED Provider Notes (Signed)
[name]    CSN: [id number] Arrival date & time: 11/05/19  0806      History   Chief Complaint Chief Complaint  Patient presents with  . Dizziness    HPI Philip Cooley is a 54 y.o. male.   54 year old male comes in for acute onset of dizziness when he woke up this morning. Describes dizziness as spinning/swaying sensation, worse with head movement. Nausea without vomiting. Denies URI symptoms, fever. Denies head injury, loss of consciousness. Denies headache, chest pain, shortness of breath. Denies ear pressure, tinnitus. Denies abdominal pain, nausea, vomiting, diarrhea. Denies urinary changes. Denies vision changes.      Past Medical History:  Diagnosis Date  . ACUTE GINGIVITIS NONPLAQUE INDUCED 09/07/2009  . ALLERGIC RHINITIS 06/17/2008  . Arthritis    hands and knees  . HYPERTENSION 06/17/2008    Patient Active Problem List   Diagnosis Date Noted  . History of back strain 12/19/2018  . ACUTE GINGIVITIS NONPLAQUE INDUCED 09/07/2009  . Essential hypertension 06/17/2008  . Allergic rhinitis 06/17/2008    Past Surgical History:  Procedure Laterality Date  . FINGER FRACTURE SURGERY    . MASS EXCISION Right 08/14/2016   Procedure: EXCISIONAL BIOPSY MASS RIGHT SMALL FINGER;  Surgeon: Leanora Cover, MD;  Location: [facility];  Service: Orthopedics;  Laterality: Right;     Home Medications    Prior to Admission medications   Medication Sig Start Date End Date Taking? Authorizing Provider  amLODipine (NORVASC) 10 MG tablet Take 1 tablet (10 mg total) by mouth daily. 06/19/19   Billie Ruddy, MD  cetirizine (ZYRTEC) 10 MG tablet Take 10 mg by mouth daily.    [provider]  fluticasone (FLONASE) 50 MCG/ACT nasal spray Place 2 sprays into both nostrils daily. 11/05/19   Ok Edwards, PA-C  meclizine (ANTIVERT) 12.5 MG tablet Take 1 tablet (12.5 mg total) by mouth 3 (three) times daily as needed for dizziness. 11/05/19   Tasia Catchings, Lataja Newland V, PA-C    Multiple Vitamin (MULTIVITAMIN WITH MINERALS) TABS Take 1 tablet by mouth daily.    [provider]  ondansetron (ZOFRAN ODT) 4 MG disintegrating tablet Take 1 tablet (4 mg total) by mouth every 8 (eight) hours as needed for nausea or vomiting. 11/05/19   Ok Edwards, PA-C    Family History Family History  Problem Relation Age of Onset  . Diabetes Mother   . Cancer Mother   . Colon cancer Neg Hx     Social History Social History   Tobacco Use  . Smoking status: Never Smoker  . Smokeless tobacco: Never Used  Vaping Use  . Vaping Use: Never used  Substance Use Topics  . Alcohol use: No  . Drug use: No     Allergies   Penicillins   Review of Systems Review of Systems  Reason unable to perform ROS: See HPI as above.     Physical Exam Triage Vital Signs ED Triage Vitals [11/05/19 0818]  Enc Vitals Group     BP (!) 149/97     Pulse Rate 71     Resp 18     Temp 98 F (36.7 C)     Temp Source Oral     SpO2 96 %     Weight      Height      Head Circumference      Peak Flow      Pain Score 0     Pain Loc  Pain Edu?      Excl. in [place]?    No data found.  Updated Vital Signs BP (!) 149/97 (BP Location: Left Arm)   Pulse 71   Temp 98 F (36.7 C) (Oral)   Resp 18   SpO2 96%   Physical Exam Constitutional:      General: He is not in acute distress.    Appearance: He is well-developed. He is not ill-appearing, toxic-appearing or diaphoretic.  HENT:     Head: Normocephalic and atraumatic.     Right Ear: Ear canal and external ear normal. Tympanic membrane is erythematous. Tympanic membrane is not bulging.     Left Ear: Ear canal and external ear normal. Tympanic membrane is erythematous. Tympanic membrane is not bulging.     Nose:     Right Sinus: No maxillary sinus tenderness or frontal sinus tenderness.     Left Sinus: No maxillary sinus tenderness or frontal sinus tenderness.     Mouth/Throat:     Mouth: Mucous membranes are moist.      Pharynx: Oropharynx is clear. Uvula midline.  Eyes:     Extraocular Movements: Extraocular movements intact.     Right eye: No nystagmus.     Left eye: No nystagmus.     Conjunctiva/sclera: Conjunctivae normal.     Pupils: Pupils are equal, round, and reactive to light.  Cardiovascular:     Rate and Rhythm: Normal rate and regular rhythm.  Pulmonary:     Effort: Pulmonary effort is normal. No accessory muscle usage, prolonged expiration, respiratory distress or retractions.     Breath sounds: No decreased air movement or transmitted upper airway sounds. No decreased breath sounds.     Comments: LCTAB Musculoskeletal:     Cervical back: Normal range of motion and neck supple.  Skin:    General: Skin is warm and dry.  Neurological:     Mental Status: He is alert and oriented to person, place, and time.     Comments: Cranial nerves II-XII grossly intact. Strength 5/5 bilaterally for upper and lower extremity. Sensation intact. Normal coordination with normal finger to nose, heel to shin. Negative pronator drift, romberg. Gait intact. Able to ambulate on own without difficulty.       UC Treatments / Results  Labs (all labs ordered are listed, but only abnormal results are displayed) Labs Reviewed - No data to display  EKG   Radiology No results found.  Procedures Procedures (including critical care time)  Medications Ordered in UC Medications - No data to display  Initial Impression / Assessment and Plan / UC Course  I have reviewed the triage vital signs and the nursing notes.  Pertinent labs & imaging results that were available during my care of the patient were reviewed by me and considered in my medical decision making (see chart for details).    No alarming signs on exam.  Acute onset of dizziness described as spinning sensation.  Neurology exam grossly intact without focal deficits.  Will treat symptomatically with Zofran and meclizine.  Patient noted to have  erythematous TM bilaterally, will cover for eustachian tube dysfunction with Flonase.  Push fluids.  Return precautions given.  Patient expresses understanding and agrees to plan.  Final Clinical Impressions(s) / UC Diagnoses   Final diagnoses:  Vertigo  Dysfunction of both eustachian tubes    ED Prescriptions    Medication Sig Dispense Auth. Provider   ondansetron (ZOFRAN ODT) 4 MG disintegrating tablet Take 1 tablet (4 mg  total) by mouth every 8 (eight) hours as needed for nausea or vomiting. 15 tablet Milan Clare V, PA-C   fluticasone (FLONASE) 50 MCG/ACT nasal spray Place 2 sprays into both nostrils daily. 1 g Henchy Mccauley V, PA-C   meclizine (ANTIVERT) 12.5 MG tablet Take 1 tablet (12.5 mg total) by mouth 3 (three) times daily as needed for dizziness. 30 tablet Ok Edwards, PA-C     PDMP not reviewed this encounter.   Ok Edwards, PA-C 11/05/19 [phone]

## 2019-12-24 ENCOUNTER — Other Ambulatory Visit: Payer: Self-pay

## 2019-12-24 ENCOUNTER — Telehealth: Payer: Self-pay | Admitting: Family Medicine

## 2019-12-24 DIAGNOSIS — I10 Essential (primary) hypertension: Secondary | ICD-10-CM

## 2019-12-24 DIAGNOSIS — Z1211 Encounter for screening for malignant neoplasm of colon: Secondary | ICD-10-CM

## 2019-12-24 MED ORDER — AMLODIPINE BESYLATE 10 MG PO TABS: 10.0000 mg | ORAL_TABLET | Freq: Every day | ORAL | 0 refills | Status: DC

## 2019-12-24 NOTE — Telephone Encounter (Signed)
Referral to GI placed as requested pt notified

## 2019-12-24 NOTE — Telephone Encounter (Signed)
amLODipine (NORVASC) 10 MG tablet  [place] ([place] [place] [facility] [place] Phone:  [phone]  Fax:  [phone]

## 2019-12-24 NOTE — Telephone Encounter (Signed)
Refill sent.

## 2019-12-24 NOTE — Telephone Encounter (Signed)
Patient would like an appointment for a colonoscopy  Please advise

## 2020-02-01 ENCOUNTER — Other Ambulatory Visit: Payer: Self-pay | Admitting: Family Medicine

## 2020-02-01 DIAGNOSIS — I10 Essential (primary) hypertension: Secondary | ICD-10-CM

## 2020-02-02 NOTE — Telephone Encounter (Signed)
Spoke with pt state that he has enough medication to get him through his appointment date. Pt is scheduled for a CPE on 02/05/2020 at 3pm

## 2020-02-05 ENCOUNTER — Encounter: Payer: Self-pay | Admitting: Family Medicine

## 2020-02-05 ENCOUNTER — Ambulatory Visit (INDEPENDENT_AMBULATORY_CARE_PROVIDER_SITE_OTHER): Payer: BLUE CROSS/BLUE SHIELD | Admitting: Family Medicine

## 2020-02-05 ENCOUNTER — Other Ambulatory Visit: Payer: Self-pay

## 2020-02-05 VITALS — BP 118/80 | HR 76 | Temp 98.5°F | Ht 65.0 in | Wt 201.0 lb

## 2020-02-05 DIAGNOSIS — Z125 Encounter for screening for malignant neoplasm of prostate: Secondary | ICD-10-CM

## 2020-02-05 DIAGNOSIS — H66003 Acute suppurative otitis media without spontaneous rupture of ear drum, bilateral: Secondary | ICD-10-CM | POA: Diagnosis not present

## 2020-02-05 DIAGNOSIS — R635 Abnormal weight gain: Secondary | ICD-10-CM | POA: Diagnosis not present

## 2020-02-05 DIAGNOSIS — Z0000 Encounter for general adult medical examination without abnormal findings: Secondary | ICD-10-CM

## 2020-02-05 DIAGNOSIS — I10 Essential (primary) hypertension: Secondary | ICD-10-CM | POA: Diagnosis not present

## 2020-02-05 MED ORDER — AZITHROMYCIN 250 MG PO TABS: ORAL_TABLET | ORAL | 0 refills | Status: DC

## 2020-02-05 MED ORDER — AMLODIPINE BESYLATE 10 MG PO TABS: 10.0000 mg | ORAL_TABLET | Freq: Every day | ORAL | 3 refills | Status: DC

## 2020-02-05 NOTE — Progress Notes (Signed)
Subjective:     Philip Cooley is a 54 y.o. male and is here for a comprehensive physical exam.  Patient notes recent visit to urgent care vertigo.  Was given meclizine and medicine for nausea.  Patient notes symptoms lasted for 2 weeks.  Patient was advised TMs were erythematous at visit.  Patient notes overall doing well.  Taking Norvasc 10 mg for BP.  Denies headaches, chest pain, changes in vision.  Patient attempted to schedule colonoscopy however was told 5-year follow-up not due until 2022.  Patient notes improvement in back pain.  Endorses occasional left shoulder pain/popping sensation with certain movements including reaching back.  This is a chronic condition that typically improves with NSAID and popping shoulder.  Pt endorses weight gain since covid pandemic.  Pt was going to the gym but has not been back.  Not to ride his bike but does not want to ride by himself.  Eating oreos daily.  Social History   Socioeconomic History  . Marital status: Married    Spouse name: Not on file  . Number of children: Not on file  . Years of education: Not on file  . Highest education level: Not on file  Occupational History  . Not on file  Tobacco Use  . Smoking status: Never Smoker  . Smokeless tobacco: Never Used  Vaping Use  . Vaping Use: Never used  Substance and Sexual Activity  . Alcohol use: No  . Drug use: No  . Sexual activity: Not on file  Other Topics Concern  . Not on file  Social History Narrative  . Not on file   Social Determinants of Health   Financial Resource Strain:   . Difficulty of Paying Living Expenses: Not on file  Food Insecurity:   . Worried About Charity fundraiser in the Last Year: Not on file  . Ran Out of Food in the Last Year: Not on file  Transportation Needs:   . Lack of Transportation (Medical): Not on file  . Lack of Transportation (Non-Medical): Not on file  Physical Activity:   . Days of Exercise per Week: Not on file  . Minutes of Exercise  per Session: Not on file  Stress:   . Feeling of Stress : Not on file  Social Connections:   . Frequency of Communication with Friends and Family: Not on file  . Frequency of Social Gatherings with Friends and Family: Not on file  . Attends Religious Services: Not on file  . Active Member of Clubs or Organizations: Not on file  . Attends Archivist Meetings: Not on file  . Marital Status: Not on file  Intimate Partner Violence:   . Fear of Current or Ex-Partner: Not on file  . Emotionally Abused: Not on file  . Physically Abused: Not on file  . Sexually Abused: Not on file   Health Maintenance  Topic Date Due  . Hepatitis C Screening  Never done  . HIV Screening  Never done  . INFLUENZA VACCINE  Never done  . COLONOSCOPY  01/03/2021  . TETANUS/TDAP  12/04/2025  . COVID-19 Vaccine  Completed    The following portions of the patient's history were reviewed and updated as appropriate: allergies, current medications, past family history, past medical history, past social history, past surgical history and problem list.  Review of Systems Pertinent items noted in HPI and remainder of comprehensive ROS otherwise negative.   Objective:    BP 118/80 (BP Location: Right Arm,  Patient Position: Sitting, Cuff Size: Normal)   Pulse 76   Temp 98.5 F (36.9 C) (Oral)   Ht _0  (1.651 m)   Wt 201 lb (91.2 kg)   SpO2 96%   BMI 33.45 kg/m  General appearance: alert, cooperative and no distress Head: Normocephalic, without obvious abnormality, atraumatic Eyes: conjunctivae/corneas clear. PERRL, EOM's intact. Fundi benign. Ears: normal external ear and canals both ears.  B/l TMs with erythema and suppurative fluid Nose: Nares normal. Septum midline. Mucosa normal. No drainage or sinus tenderness. Throat: lips, mucosa, and tongue normal; teeth and gums normal Neck: no adenopathy, no carotid bruit, no JVD, supple, symmetrical, trachea midline and thyroid not enlarged, symmetric,  no tenderness/mass/nodules Lungs: clear to auscultation bilaterally Heart: regular rate and rhythm, S1, S2 normal, no murmur, click, rub or gallop Abdomen: soft, non-tender; bowel sounds normal; no masses,  no organomegaly Extremities: extremities normal, atraumatic, no cyanosis or edema Pulses: 2+ and symmetric Skin: Skin color, texture, turgor normal. No rashes or lesions Lymph nodes: Cervical, supraclavicular, and axillary nodes normal. Neurologic: Alert and oriented X 3, normal strength and tone. Normal symmetric reflexes. Normal coordination and gait    Assessment:    Healthy male exam.      Plan:     Anticipatory guidance given including wearing seatbelts, smoke detectors in the home, increasing physical activity, increasing p.o. intake of water and vegetables. -will obtain labs -next colonoscopy due Oct 2022 -will check PSA -given handout -next CPE in 1 yr See After Visit Summary for Counseling Recommendations    Essential hypertension  -controlled -continue lifestyle modificaitons -continue Norvasc 10 mg daily - Plan: CMP with eGFR([facility]), amLODipine (NORVASC) 10 MG tablet   Non-recurrent acute suppurative otitis media of both ears without spontaneous rupture of tympanic membranes  -Allergy to PCNs -given rx for azithromycin -Okay to use NSAIDs or Tylenol as needed for any pain or discomfort -Given precautions - Plan: CBC (no diff), azithromycin (ZITHROMAX) 250 MG tablet, CBC (no diff)  Prostate cancer screening - Plan: PSA  Weight gain  -Discussed the importance of lifestyle modifications -Patient encouraged to increase physical activity and decrease caloric intake -We will obtain labs -Consider looking into local cycling groups - Plan: Lipid panel, Hemoglobin A1c, TSH  F/u prn  Grier Mitts, MD

## 2020-02-05 NOTE — Patient Instructions (Signed)
Preventive Care 41-55 Years Old, Male Preventive care refers to lifestyle choices and visits with your health care provider that can promote health and wellness. This includes:  A yearly physical exam. This is also called an annual well check.  Regular dental and eye exams.  Immunizations.  Screening for certain conditions.  Healthy lifestyle choices, such as eating a healthy diet, getting regular exercise, not using drugs or products that contain nicotine and tobacco, and limiting alcohol use. What can I expect for my preventive care visit? Physical exam Your health care provider will check:  Height and weight. These may be used to calculate body mass index (BMI), which is a measurement that tells if you are at a healthy weight.  Heart rate and blood pressure.  Your skin for abnormal spots. Counseling Your health care provider may ask you questions about:  Alcohol, tobacco, and drug use.  Emotional well-being.  Home and relationship well-being.  Sexual activity.  Eating habits.  Work and work Statistician. What immunizations do I need?  Influenza (flu) vaccine  This is recommended every year. Tetanus, diphtheria, and pertussis (Tdap) vaccine  You may need a Td booster every 10 years. Varicella (chickenpox) vaccine  You may need this vaccine if you have not already been vaccinated. Zoster (shingles) vaccine  You may need this after age 64. Measles, mumps, and rubella (MMR) vaccine  You may need at least one dose of MMR if you were born in 1957 or later. You may also need a second dose. Pneumococcal conjugate (PCV13) vaccine  You may need this if you have certain conditions and were not previously vaccinated. Pneumococcal polysaccharide (PPSV23) vaccine  You may need one or two doses if you smoke cigarettes or if you have certain conditions. Meningococcal conjugate (MenACWY) vaccine  You may need this if you have certain conditions. Hepatitis A  vaccine  You may need this if you have certain conditions or if you travel or work in places where you may be exposed to hepatitis A. Hepatitis B vaccine  You may need this if you have certain conditions or if you travel or work in places where you may be exposed to hepatitis B. Haemophilus influenzae type b (Hib) vaccine  You may need this if you have certain risk factors. Human papillomavirus (HPV) vaccine  If recommended by your health care provider, you may need three doses over 6 months. You may receive vaccines as individual doses or as more than one vaccine together in one shot (combination vaccines). Talk with your health care provider about the risks and benefits of combination vaccines. What tests do I need? Blood tests  Lipid and cholesterol levels. These may be checked every 5 years, or more frequently if you are over 60 years old.  Hepatitis C test.  Hepatitis B test. Screening  Lung cancer screening. You may have this screening every year starting at age 43 if you have a 30-pack-year history of smoking and currently smoke or have quit within the past 15 years.  Prostate cancer screening. Recommendations will vary depending on your family history and other risks.  Colorectal cancer screening. All adults should have this screening starting at age 72 and continuing until age 2. Your health care provider may recommend screening at age 14 if you are at increased risk. You will have tests every 1-10 years, depending on your results and the type of screening test.  Diabetes screening. This is done by checking your blood sugar (glucose) after you have not eaten  for a while (fasting). You may have this done every 1-3 years.  Sexually transmitted disease (STD) testing. Follow these instructions at home: Eating and drinking  Eat a diet that includes fresh fruits and vegetables, whole grains, lean protein, and low-fat dairy products.  Take vitamin and mineral supplements as  recommended by your health care provider.  Do not drink alcohol if your health care provider tells you not to drink.  If you drink alcohol: ? Limit how much you have to 0-2 drinks a day. ? Be aware of how much alcohol is in your drink. In the [place] one drink equals one 12 oz bottle of beer (355 mL), one 5 oz glass of wine (148 mL), or one 1 oz glass of hard liquor (44 mL). Lifestyle  Take daily care of your teeth and gums.  Stay active. Exercise for at least 30 minutes on 5 or more days each week.  Do not use any products that contain nicotine or tobacco, such as cigarettes, e-cigarettes, and chewing tobacco. If you need help quitting, ask your health care provider.  If you are sexually active, practice safe sex. Use a condom or other form of protection to prevent STIs (sexually transmitted infections).  Talk with your health care provider about taking a low-dose aspirin every day starting at age 72. What's next?  Go to your health care provider once a year for a well check visit.  Ask your health care provider how often you should have your eyes and teeth checked.  Stay up to date on all vaccines. This information is not intended to replace advice given to you by your health care provider. Make sure you discuss any questions you have with your health care provider. Document Revised: 03/06/2018 Document Reviewed: 03/06/2018 Elsevier Patient Education  [place].  Otitis Media, Adult  Otitis media occurs when there is inflammation and fluid in the middle ear. Your middle ear is a part of the ear that contains bones for hearing as well as air that helps send sounds to your brain. What are the causes? This condition is caused by a blockage in the eustachian tube. This tube drains fluid from the ear to the back of the nose (nasopharynx). A blockage in this tube can be caused by an object or by swelling (edema) in the tube. Problems that can cause a blockage include:  A cold  or other upper respiratory infection.  Allergies.  An irritant, such as tobacco smoke.  Enlarged adenoids. The adenoids are areas of soft tissue located high in the back of the throat, behind the nose and the roof of the mouth.  A mass in the nasopharynx.  Damage to the ear caused by pressure changes (barotrauma). What are the signs or symptoms? Symptoms of this condition include:  Ear pain.  A fever.  Decreased hearing.  A headache.  Tiredness (lethargy).  Fluid leaking from the ear.  Ringing in the ear. How is this diagnosed? This condition is diagnosed with a physical exam. During the exam your health care provider will use an instrument called an otoscope to look into your ear and check for redness, swelling, and fluid. He or she will also ask about your symptoms. Your health care provider may also order tests, such as:  A test to check the movement of the eardrum (pneumatic otoscopy). This test is done by squeezing a small amount of air into the ear.  A test that changes air pressure in the middle ear to  check how well the eardrum moves and whether the eustachian tube is working (tympanogram). How is this treated? This condition usually goes away on its own within 3-5 days. But if the condition is caused by a bacteria infection and does not go away own its own, or keeps coming back, your health care provider may:  Prescribe antibiotic medicines to treat the infection.  Prescribe or recommend medicines to control pain. Follow these instructions at home:  Take over-the-counter and prescription medicines only as told by your health care provider.  If you were prescribed an antibiotic medicine, take it as told by your health care provider. Do not stop taking the antibiotic even if you start to feel better.  Keep all follow-up visits as told by your health care provider. This is important. Contact a health care provider if:  You have bleeding from your nose.  There  is a lump on your neck.  You are not getting better in 5 days.  You feel worse instead of better. Get help right away if:  You have severe pain that is not controlled with medicine.  You have swelling, redness, or pain around your ear.  You have stiffness in your neck.  A part of your face is paralyzed.  The bone behind your ear (mastoid) is tender when you touch it.  You develop a severe headache. Summary  Otitis media is redness, soreness, and swelling of the middle ear.  This condition usually goes away on its own within 3-5 days.  If the problem does not go away in 3-5 days, your health care provider may prescribe or recommend medicines to treat your symptoms.  If you were prescribed an antibiotic medicine, take it as told by your health care provider. This information is not intended to replace advice given to you by your health care provider. Make sure you discuss any questions you have with your health care provider. Document Revised: 02/22/2017 Document Reviewed: 03/02/2016 Elsevier Patient Education  [place].  Vertigo Vertigo is the feeling that you or the things around you are moving when they are not. This feeling can come and go at any time. Vertigo often goes away on its own. This condition can be dangerous if it happens when you are doing activities like driving or working with machines. Your doctor will do tests to find the cause of your vertigo. These tests will also help your doctor decide on the best treatment for you. Follow these instructions at home: Eating and drinking      Drink enough fluid to keep your pee (urine) pale yellow.  Do not drink alcohol. Activity  Return to your normal activities as told by your doctor. Ask your doctor what activities are safe for you.  In the morning, first sit up on the side of the bed. When you feel okay, stand slowly while you hold onto something until you know that your balance is fine.  Move  slowly. Avoid sudden body or head movements or certain positions, as told by your doctor.  Use a cane if you have trouble standing or walking.  Sit down right away if you feel dizzy.  Avoid doing any tasks or activities that can cause danger to you or others if you get dizzy.  Avoid bending down if you feel dizzy. Place items in your home so that they are easy for you to reach without leaning over.  Do not drive or use heavy machinery if you feel dizzy. General instructions  Take over-the-counter and prescription medicines only as told by your doctor.  Keep all follow-up visits as told by your doctor. This is important. Contact a doctor if:  Your medicine does not help your vertigo.  You have a fever.  Your problems get worse or you have new symptoms.  Your family or friends see changes in your behavior.  The feeling of being sick to your stomach gets worse.  Your vomiting gets worse.  You lose feeling (have numbness) in part of your body.  You feel prickling and tingling in a part of your body. Get help right away if:  You have trouble moving or talking.  You are always dizzy.  You pass out (faint).  You get very bad headaches.  You feel weak in your hands, arms, or legs.  You have changes in your hearing.  You have changes in how you see (vision).  You get a stiff neck.  Bright light starts to bother you. Summary  Vertigo is the feeling that you or the things around you are moving when they are not.  Your doctor will do tests to find the cause of your vertigo.  You may be told to avoid some tasks, positions, or movements.  Contact a doctor if your medicine is not helping, or if you have a fever, new symptoms, or a change in behavior.  Get help right away if you get very bad headaches, or if you have changes in how you speak, hear, or see. This information is not intended to replace advice given to you by your health care provider. Make sure you discuss  any questions you have with your health care provider. Document Revised: 02/03/2018 Document Reviewed: 02/03/2018 Elsevier Patient Education  2020 [employer]. How to Perform the Epley Maneuver The Epley maneuver is an exercise that relieves symptoms of vertigo. Vertigo is the feeling that you or your surroundings are moving when they are not. When you feel vertigo, you may feel like the room is spinning and have trouble walking. Dizziness is a little different than vertigo. When you are dizzy, you may feel unsteady or light-headed. You can do this maneuver at home whenever you have symptoms of vertigo. You can do it up to 3 times a day until your symptoms go away. Even though the Epley maneuver may relieve your vertigo for a few weeks, it is possible that your symptoms will return. This maneuver relieves vertigo, but it does not relieve dizziness. What are the risks? If it is done correctly, the Epley maneuver is considered safe. Sometimes it can lead to dizziness or nausea that goes away after a short time. If you develop other symptoms, such as changes in vision, weakness, or numbness, stop doing the maneuver and call your health care provider. How to perform the Epley maneuver 1. Sit on the edge of a bed or table with your back straight and your legs extended or hanging over the edge of the bed or table. 2. Turn your head halfway toward the affected ear or side. 3. Lie backward quickly with your head turned until you are lying flat on your back. You may want to position a pillow under your shoulders. 4. Hold this position for 30 seconds. You may experience an attack of vertigo. This is normal. 5. Turn your head to the opposite direction until your unaffected ear is facing the floor. 6. Hold this position for 30 seconds. You may experience an attack of vertigo. This is normal. Hold this  position until the vertigo stops. 7. Turn your whole body to the same side as your head. Hold for another 30  seconds. 8. Sit back up. You can repeat this exercise up to 3 times a day. Follow these instructions at home:  After doing the Epley maneuver, you can return to your normal activities.  Ask your health care provider if there is anything you should do at home to prevent vertigo. He or she may recommend that you: ? Keep your head raised (elevated) with two or more pillows while you sleep. ? Do not sleep on the side of your affected ear. ? Get up slowly from bed. ? Avoid sudden movements during the day. ? Avoid extreme head movement, like looking up or bending over. Contact a health care provider if:  Your vertigo gets worse.  You have other symptoms, including: ? Nausea. ? Vomiting. ? Headache. Get help right away if:  You have vision changes.  You have a severe or worsening headache or neck pain.  You cannot stop vomiting.  You have new numbness or weakness in any part of your body. Summary  Vertigo is the feeling that you or your surroundings are moving when they are not.  The Epley maneuver is an exercise that relieves symptoms of vertigo.  If the Epley maneuver is done correctly, it is considered safe. You can do it up to 3 times a day. This information is not intended to replace advice given to you by your health care provider. Make sure you discuss any questions you have with your health care provider. Document Revised: 02/22/2017 Document Reviewed: 01/31/2016 Elsevier Patient Education  2020 [employer].

## 2020-02-06 LAB — LIPID PANEL
Cholesterol: 169 mg/dL (ref <200)
HDL: 56 mg/dL (ref >=40)
LDL Cholesterol (Calc): 97 mg/dL (calc)
Non-HDL Cholesterol (Calc): 113 mg/dL (calc) (ref <130)
Total CHOL/HDL Ratio: 3 (calc) (ref <5.0)
Triglycerides: 71 mg/dL (ref <150)

## 2020-02-06 LAB — COMPLETE METABOLIC PANEL WITH GFR
AG Ratio: 1.5 (calc) (ref 1.0–2.5)
ALT: 26 U/L (ref 9–46)
AST: 22 U/L (ref 10–35)
Albumin: 4.3 g/dL (ref 3.6–5.1)
Alkaline phosphatase (APISO): 67 U/L (ref 35–144)
BUN: 11 mg/dL (ref 7–25)
CO2: 25 mmol/L (ref 20–32)
Calcium: 9.8 mg/dL (ref 8.6–10.3)
Chloride: 102 mmol/L (ref 98–110)
Creat: 1.15 mg/dL (ref 0.70–1.33)
GFR, Est African American: 83 mL/min/{1.73_m2} (ref >=60)
GFR, Est Non African American: 71 mL/min/{1.73_m2} (ref >=60)
Globulin: 2.8 g/dL (calc) (ref 1.9–3.7)
Glucose, Bld: 89 mg/dL (ref 65–99)
Potassium: 4 mmol/L (ref 3.5–5.3)
Sodium: 139 mmol/L (ref 135–146)
Total Bilirubin: 1.3 mg/dL — ABNORMAL HIGH (ref 0.2–1.2)
Total Protein: 7.1 g/dL (ref 6.1–8.1)

## 2020-02-06 LAB — CBC
HCT: 55.5 % — ABNORMAL HIGH (ref 38.5–50.0)
Hemoglobin: 18.3 g/dL — ABNORMAL HIGH (ref 13.2–17.1)
MCH: 28.6 pg (ref 27.0–33.0)
MCHC: 33 g/dL (ref 32.0–36.0)
MCV: 86.9 fL (ref 80.0–100.0)
MPV: 10.4 fL (ref 7.5–12.5)
Platelets: 230 10*3/uL (ref 140–400)
RBC: 6.39 10*6/uL — ABNORMAL HIGH (ref 4.20–5.80)
RDW: 13 % (ref 11.0–15.0)
WBC: 6.5 10*3/uL (ref 3.8–10.8)

## 2020-02-06 LAB — PSA: PSA: 0.85 ng/mL (ref <=4.0)

## 2020-02-06 LAB — HEMOGLOBIN A1C
Hgb A1c MFr Bld: 5.7 % of total Hgb — ABNORMAL HIGH (ref <5.7)
Mean Plasma Glucose: 117 (calc)
eAG (mmol/L): 6.5 (calc)

## 2020-02-06 LAB — TSH: TSH: 0.99 mIU/L (ref 0.40–4.50)

## 2020-02-09 ENCOUNTER — Other Ambulatory Visit: Payer: Self-pay | Admitting: Family Medicine

## 2020-02-09 DIAGNOSIS — D751 Secondary polycythemia: Secondary | ICD-10-CM | POA: Insufficient documentation

## 2020-02-10 ENCOUNTER — Telehealth: Payer: Self-pay | Admitting: Family Medicine

## 2020-02-10 NOTE — Telephone Encounter (Signed)
Patient called and stated that he was returning the call, please advise. CB is [phone]

## 2020-02-11 NOTE — Telephone Encounter (Signed)
Reviewed lab results with pt verbalized understanding of lab results and recommendations

## 2020-02-13 IMAGING — DX DG KNEE COMPLETE 4+V*L*
4 series · 4 of 4 positions shown · non-contrast
Comparison: None.

CLINICAL DATA: Left lateral and medial knee pain 2-3 weeks. No
injury.

EXAM:
LEFT KNEE - COMPLETE 4+ VIEW

[knee ap (1 of 3)]
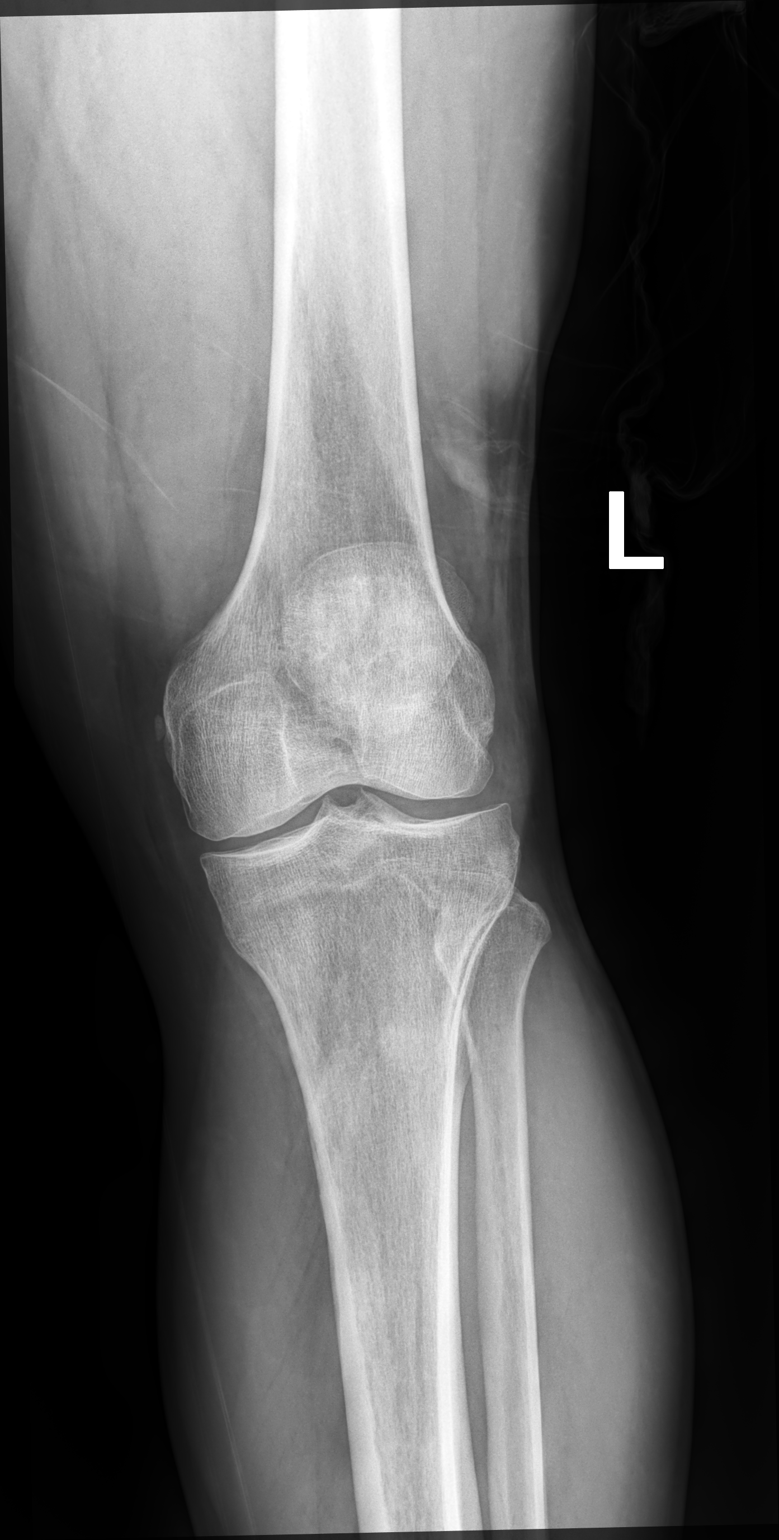

[knee ap (2 of 3)]
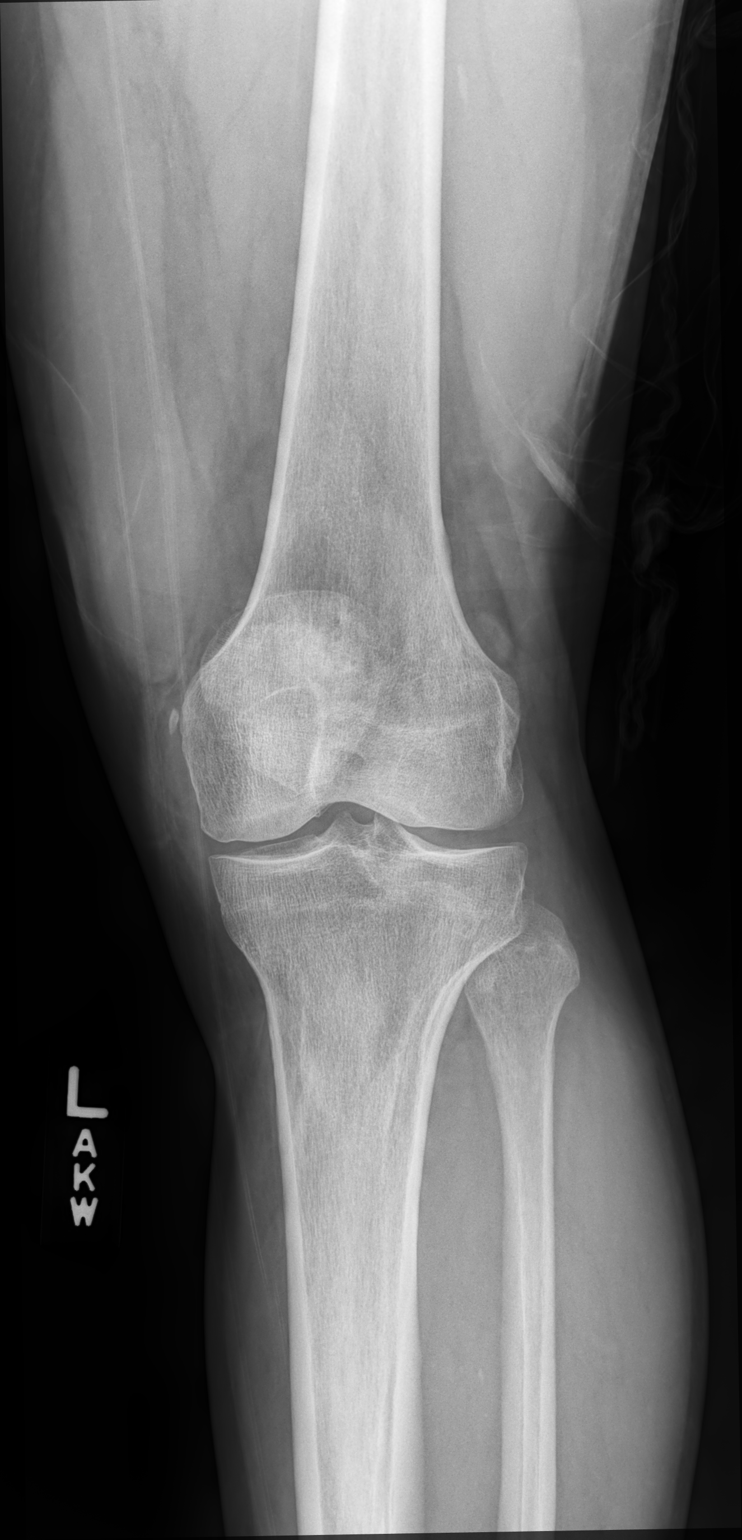

[knee ap (3 of 3)]
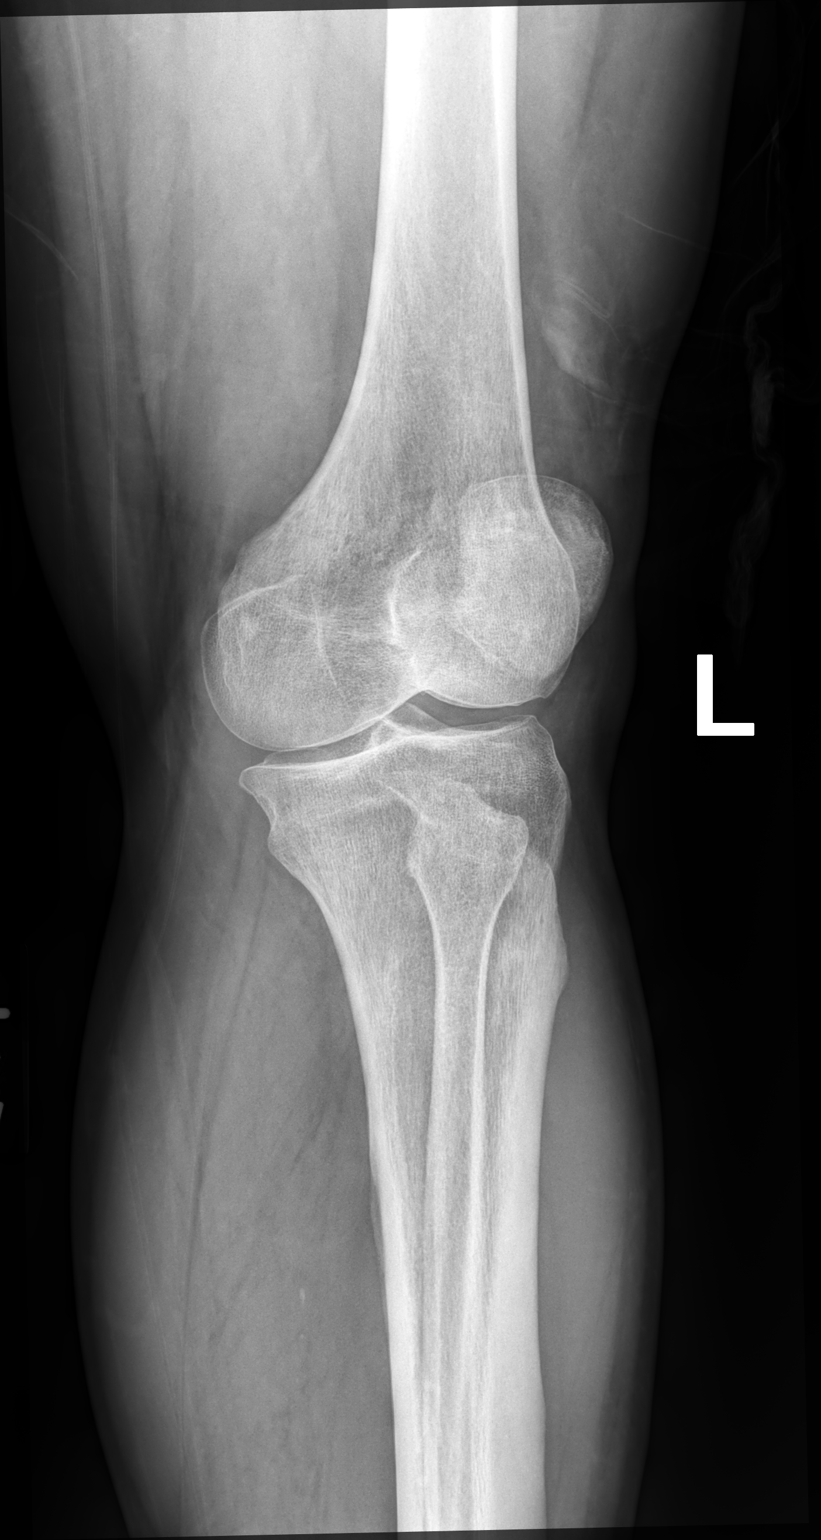

[knee lat]
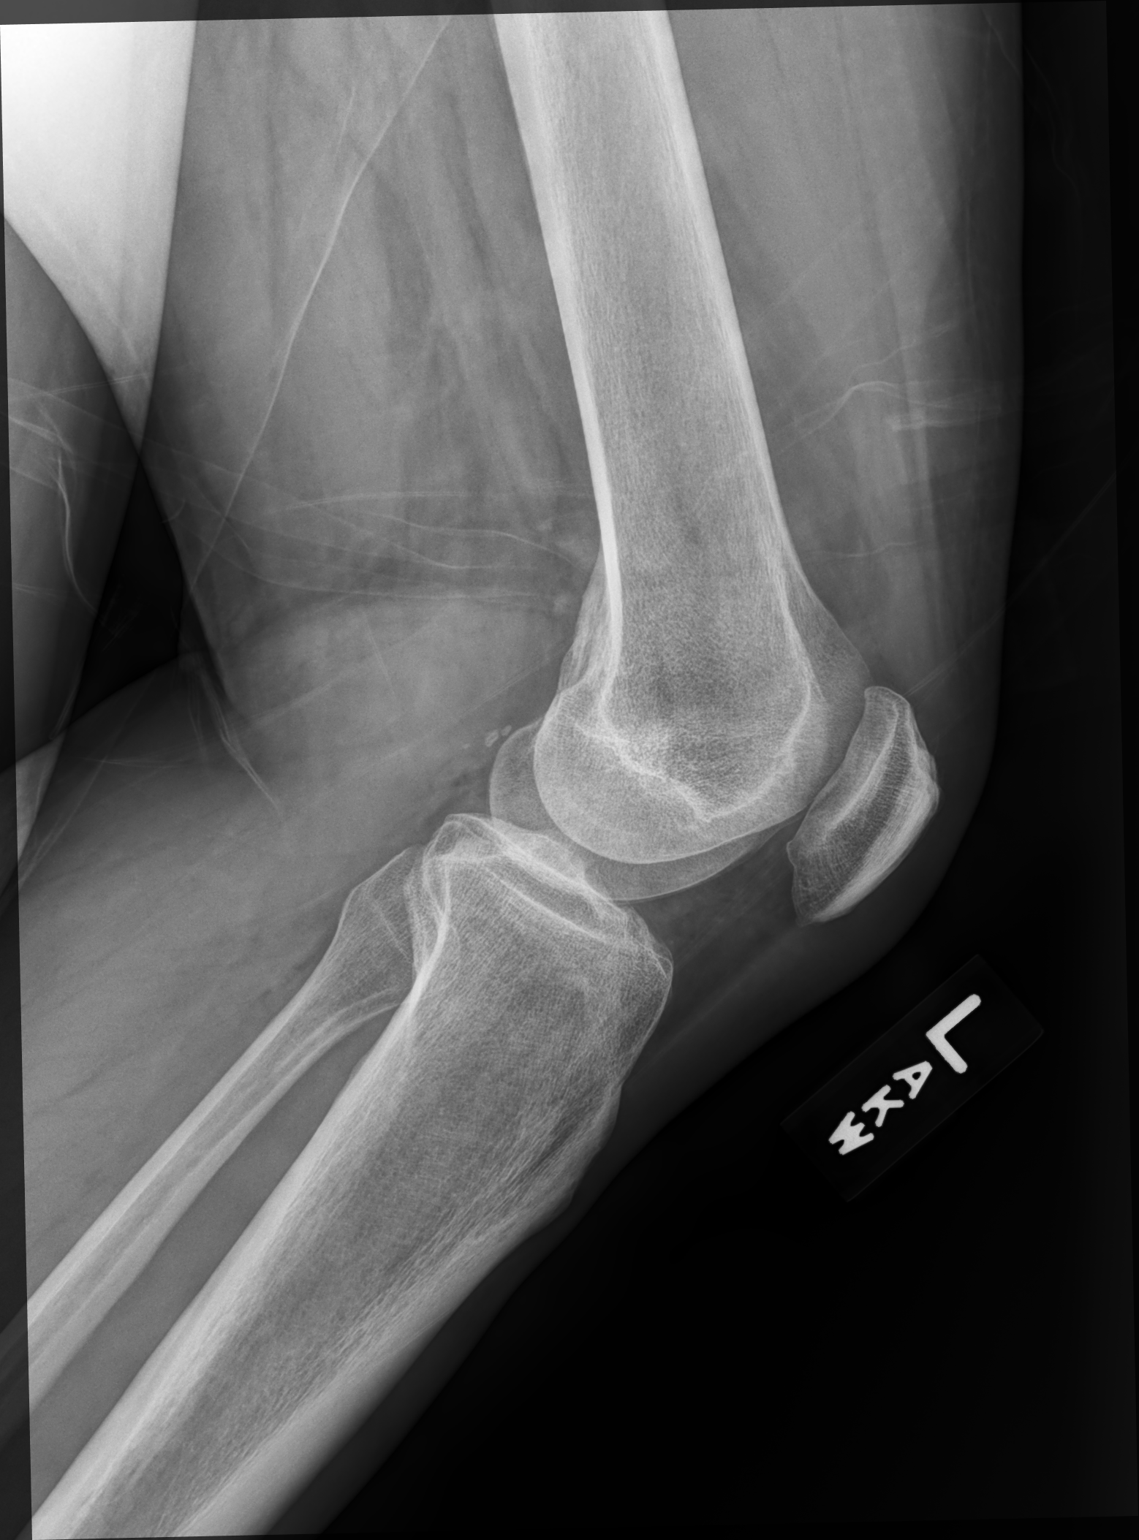

[4 of 4 positions shown; findings below may reference images not displayed]

FINDINGS: Minimal early degenerative changes are present. There is no fracture
or dislocation. No significant joint effusion. Subtle calcification
over the femoral attachment of the medial collateral ligament.
IMPRESSION: No acute findings.

## 2020-02-23 ENCOUNTER — Telehealth: Payer: Self-pay | Admitting: Hematology and Oncology

## 2020-02-23 NOTE — Telephone Encounter (Signed)
Received a new hem referral from Dr. Volanda Napoleon for polycythemia. Philip Cooley has been cld and scheduled to see Dr. Lindi Adie on 12/20 at 345pm. Pt aware to arrive 30 minutes early.

## 2020-02-25 DIAGNOSIS — R059 Cough, unspecified: Secondary | ICD-10-CM | POA: Diagnosis not present

## 2020-02-25 DIAGNOSIS — R0602 Shortness of breath: Secondary | ICD-10-CM | POA: Diagnosis not present

## 2020-03-13 NOTE — Progress Notes (Signed)
[facility] NOTE  Patient Care Team: Billie Ruddy, MD as PCP - General (Family Medicine)  CHIEF COMPLAINTS/PURPOSE OF CONSULTATION:  Newly diagnosed polycythemia  HISTORY OF PRESENTING ILLNESS:  Philip Cooley 54 y.o. male is here because of recent diagnosis of polycythemia. Labs on 02/05/20 showed Hg 18.3, HCT 55.5, platelets 230. He presents to the clinic today for initial evaluation.   I reviewed his records extensively and collaborated the history with the patient.  MEDICAL HISTORY:  Past Medical History:  Diagnosis Date  . ACUTE GINGIVITIS NONPLAQUE INDUCED 09/07/2009  . ALLERGIC RHINITIS 06/17/2008  . Arthritis    hands and knees  . HYPERTENSION 06/17/2008    SURGICAL HISTORY: Past Surgical History:  Procedure Laterality Date  . FINGER FRACTURE SURGERY    . MASS EXCISION Right 08/14/2016   Procedure: EXCISIONAL BIOPSY MASS RIGHT SMALL FINGER;  Surgeon: Leanora Cover, MD;  Location: [facility];  Service: Orthopedics;  Laterality: Right;    SOCIAL HISTORY: Social History   Socioeconomic History  . Marital status: Married    Spouse name: Not on file  . Number of children: Not on file  . Years of education: Not on file  . Highest education level: Not on file  Occupational History  . Not on file  Tobacco Use  . Smoking status: Never Smoker  . Smokeless tobacco: Never Used  Vaping Use  . Vaping Use: Never used  Substance and Sexual Activity  . Alcohol use: No  . Drug use: No  . Sexual activity: Not on file  Other Topics Concern  . Not on file  Social History Narrative  . Not on file   Social Determinants of Health   Financial Resource Strain: Not on file  Food Insecurity: Not on file  Transportation Needs: Not on file  Physical Activity: Not on file  Stress: Not on file  Social Connections: Not on file  Intimate Partner Violence: Not on file    FAMILY HISTORY: Family History  Problem Relation Age of Onset  .  Diabetes Mother   . Cancer Mother   . Colon cancer Neg Hx     ALLERGIES:  is allergic to penicillins.  MEDICATIONS:  Current Outpatient Medications  Medication Sig Dispense Refill  . amLODipine (NORVASC) 10 MG tablet Take 1 tablet (10 mg total) by mouth daily. 90 tablet 3  . azithromycin (ZITHROMAX) 250 MG tablet Take 2 pills on day 1.  Then take 1 pill daily on days 2 through 5. 6 tablet 0  . cetirizine (ZYRTEC) 10 MG tablet Take 10 mg by mouth daily.    . fluticasone (FLONASE) 50 MCG/ACT nasal spray Place 2 sprays into both nostrils daily. 1 g 0  . meclizine (ANTIVERT) 12.5 MG tablet Take 1 tablet (12.5 mg total) by mouth 3 (three) times daily as needed for dizziness. 30 tablet 0  . Multiple Vitamin (MULTIVITAMIN WITH MINERALS) TABS Take 1 tablet by mouth daily.    . ondansetron (ZOFRAN ODT) 4 MG disintegrating tablet Take 1 tablet (4 mg total) by mouth every 8 (eight) hours as needed for nausea or vomiting. 15 tablet 0   No current facility-administered medications for this visit.    REVIEW OF SYSTEMS:   Constitutional: Denies fevers, chills or abnormal night sweats Eyes: Denies blurriness of vision, double vision or watery eyes Ears, nose, mouth, throat, and face: Denies mucositis or sore throat Respiratory: Denies cough, dyspnea or wheezes Cardiovascular: Denies palpitation, chest discomfort or lower extremity swelling  Gastrointestinal:  Denies nausea, heartburn or change in bowel habits Skin: Denies abnormal skin rashes Lymphatics: Denies new lymphadenopathy or easy bruising Neurological:Denies numbness, tingling or new weaknesses Behavioral/Psych: Mood is stable, no new changes  All other systems were reviewed with the patient and are negative.  PHYSICAL EXAMINATION: ECOG PERFORMANCE STATUS: 1 - Symptomatic but completely ambulatory  There were no vitals filed for this visit. There were no vitals filed for this visit.  GENERAL:alert, no distress and comfortable SKIN:  skin color, texture, turgor are normal, no rashes or significant lesions EYES: normal, conjunctiva are pink and non-injected, sclera clear OROPHARYNX:no exudate, no erythema and lips, buccal mucosa, and tongue normal  NECK: supple, thyroid normal size, non-tender, without nodularity LYMPH:  no palpable lymphadenopathy in the cervical, axillary or inguinal LUNGS: clear to auscultation and percussion with normal breathing effort HEART: regular rate & rhythm and no murmurs and no lower extremity edema ABDOMEN:abdomen soft, non-tender and normal bowel sounds Musculoskeletal:no cyanosis of digits and no clubbing  PSYCH: alert & oriented x 3 with fluent speech NEURO: no focal motor/sensory deficits  LABORATORY DATA:  I have reviewed the data as listed Lab Results  Component Value Date   WBC 6.5 02/05/2020   HGB 18.3 (H) 02/05/2020   HCT 55.5 (H) 02/05/2020   MCV 86.9 02/05/2020   PLT 230 02/05/2020   Lab Results  Component Value Date   NA 139 02/05/2020   K 4.0 02/05/2020   CL 102 02/05/2020   CO2 25 02/05/2020    RADIOGRAPHIC STUDIES: I have personally reviewed the radiological reports and agreed with the findings in the report.  ASSESSMENT AND PLAN:  Polycythemia Longstanding elevated hemoglobin level Lab review 08/10/2010 hemoglobin 17.3 11/12/2017: Hemoglobin 17.9 02/05/2020: Hemoglobin 18.3, hematocrit 55.5  I discussed with the patient extensively the differential diagnosis of polycythemia 1. Primary polycythemia due to clonal stem cell abnormality 2. Secondary polycythemia due to cause that include hypoxia, heart or lung problems, altitude, athletics, erythropoietin producing lesion/tumors etc  Recommendation: 1. JAK-2 mutation testing to evaluate polycythemia vera 2. Erythropoietin level    Indications for phlebotomy 1. Primary polycythemia with hematocrit over 55 2. Secondary polycythemia with severe symptoms which include strokelike symptoms, severe recurrent  headaches, severe fatigue.   Return to clinic in 2 weeks to discuss results of these tests with a MyChart virtual visit.        All questions were answered. The patient knows to call the clinic with any problems, questions or concerns.   Rulon Eisenmenger, MD, MPH 03/14/2020    I, Molly Dorshimer, am acting as scribe for Nicholas Lose, MD.  I have reviewed the above documentation for accuracy and completeness, and I agree with the above.

## 2020-03-14 ENCOUNTER — Other Ambulatory Visit: Payer: Self-pay

## 2020-03-14 ENCOUNTER — Inpatient Hospital Stay: Payer: BLUE CROSS/BLUE SHIELD | Attending: Hematology and Oncology | Admitting: Hematology and Oncology

## 2020-03-14 DIAGNOSIS — D751 Secondary polycythemia: Secondary | ICD-10-CM | POA: Diagnosis not present

## 2020-03-14 NOTE — Assessment & Plan Note (Signed)
Longstanding elevated hemoglobin level Lab review 08/10/2010 hemoglobin 17.3 11/12/2017: Hemoglobin 17.9 02/05/2020: Hemoglobin 18.3, hematocrit 55.5  I discussed with the patient extensively the differential diagnosis of polycythemia 1. Primary polycythemia due to clonal stem cell abnormality 2. Secondary polycythemia due to cause that include hypoxia, heart or lung problems, altitude, athletics, erythropoietin producing lesion/tumors etc  Recommendation: 1. JAK-2 mutation testing to evaluate polycythemia vera 2. Erythropoietin level    Indications for phlebotomy 1. Primary polycythemia with hematocrit over 55 2. Secondary polycythemia with severe symptoms which include strokelike symptoms, severe recurrent headaches, severe fatigue.   Return to clinic in 2 weeks to discuss results of these tests with a MyChart virtual visit.

## 2020-03-15 ENCOUNTER — Inpatient Hospital Stay: Payer: BLUE CROSS/BLUE SHIELD

## 2020-03-15 ENCOUNTER — Other Ambulatory Visit: Payer: Self-pay

## 2020-03-15 DIAGNOSIS — D751 Secondary polycythemia: Secondary | ICD-10-CM | POA: Diagnosis not present

## 2020-03-15 LAB — CBC WITH DIFFERENTIAL (CANCER CENTER ONLY)
Abs Immature Granulocytes: 0.01 10*3/uL (ref 0.00–0.07)
Basophils Absolute: 0.1 10*3/uL (ref 0.0–0.1)
Basophils Relative: 1 %
Eosinophils Absolute: 0.2 10*3/uL (ref 0.0–0.5)
Eosinophils Relative: 4 %
HCT: 51.5 % (ref 39.0–52.0)
Hemoglobin: 18 g/dL — ABNORMAL HIGH (ref 13.0–17.0)
Immature Granulocytes: 0 %
Lymphocytes Relative: 32 %
Lymphs Abs: 2.1 10*3/uL (ref 0.7–4.0)
MCH: 30 pg (ref 26.0–34.0)
MCHC: 35 g/dL (ref 30.0–36.0)
MCV: 85.7 fL (ref 80.0–100.0)
Monocytes Absolute: 0.7 10*3/uL (ref 0.1–1.0)
Monocytes Relative: 11 %
Neutro Abs: 3.5 10*3/uL (ref 1.7–7.7)
Neutrophils Relative %: 52 %
Platelet Count: 219 10*3/uL (ref 150–400)
RBC: 6.01 MIL/uL — ABNORMAL HIGH (ref 4.22–5.81)
RDW: 13.3 % (ref 11.5–15.5)
WBC Count: 6.6 10*3/uL (ref 4.0–10.5)
nRBC: 0 % (ref 0.0–0.2)

## 2020-03-16 LAB — ERYTHROPOIETIN: Erythropoietin: 7.3 m[IU]/mL (ref 2.6–18.5)

## 2020-03-21 ENCOUNTER — Telehealth: Payer: Self-pay | Admitting: Hematology and Oncology

## 2020-03-21 NOTE — Telephone Encounter (Signed)
Rescheduled appointment due to overbooking. Patient is aware of changes. 

## 2020-03-24 LAB — JAK2 (INCLUDING V617F AND EXON 12), MPL,& CALR W/RFL MPN PANEL (NGS)

## 2020-03-29 ENCOUNTER — Ambulatory Visit: Payer: BLUE CROSS/BLUE SHIELD | Admitting: Hematology and Oncology

## 2020-04-08 ENCOUNTER — Telehealth: Payer: Self-pay | Admitting: Hematology and Oncology

## 2020-04-08 NOTE — Telephone Encounter (Signed)
Contacted patient to verify telephone visit for pre reg °

## 2020-04-10 NOTE — Progress Notes (Signed)
  HEMATOLOGY-ONCOLOGY TELEPHONE VISIT PROGRESS NOTE  I connected with Philip Cooley on 04/11/2020 at  3:30 PM EST by telephone and verified that I am speaking with the correct person using two identifiers.  I discussed the limitations, risks, security and privacy concerns of performing an evaluation and management service by telephone and the availability of in person appointments.  I also discussed with the patient that there may be a patient responsible charge related to this service. The patient expressed understanding and agreed to proceed.   History of Present Illness: Philip Cooley is a 55 y.o. male with above-mentioned history of polycythemia. Labs on 03/15/20 showed Hg 18.0, HCT 51.5, platelets 219, erythropoietin 7.3, JAK2 negative. He presents over the phone today for follow-up.   Observations/Objective:  No symptoms or signs of blood clots    Assessment Plan:  Polycythemia Longstanding elevated hemoglobin level Lab review 08/10/2010 hemoglobin 17.3 11/12/2017: Hemoglobin 17.9 02/05/2020: Hemoglobin 18.3, hematocrit 55.5 03/15/2020: Hemoglobin 18, hematocrit 51.5  Lab review: 03/15/2020 JAK2 mutation: Negative Erythropoietin: 7.3  Counseling: The cause of elevated hemoglobin is secondary polycythemia.  The cause of his hemoglobin elevation is completely unclear.  He does not have any risk factors like heart disease or lung disease or COPD or testosterone replacement therapy etc.  Treatment plan: Since the patient does not have any symptoms related to the polycythemia, this can be watched and monitored.  I did encourage the patient to donate blood every 3 months.  Return to clinic in 6 months with labs and follow-up  I discussed the assessment and treatment plan with the patient. The patient was provided an opportunity to ask questions and all were answered. The patient agreed with the plan and demonstrated an understanding of the instructions. The patient was advised to call back  or seek an in-person evaluation if the symptoms worsen or if the condition fails to improve as anticipated.   I provided 12 minutes of non-face-to-face time during this encounter.   Rulon Eisenmenger, MD 04/11/2020    I, Molly Dorshimer, am acting as scribe for Nicholas Lose, MD.  I have reviewed the above documentation for accuracy and completeness, and I agree with the above.

## 2020-04-11 ENCOUNTER — Inpatient Hospital Stay: Payer: Managed Care, Other (non HMO) | Attending: Hematology and Oncology | Admitting: Hematology and Oncology

## 2020-04-11 DIAGNOSIS — D751 Secondary polycythemia: Secondary | ICD-10-CM

## 2020-04-11 NOTE — Assessment & Plan Note (Signed)
Longstanding elevated hemoglobin level Lab review 08/10/2010 hemoglobin 17.3 11/12/2017: Hemoglobin 17.9 02/05/2020: Hemoglobin 18.3, hematocrit 55.5 03/15/2020: Hemoglobin 18, hematocrit 51.5  Lab review: 03/15/2020 JAK2 mutation: Negative Erythropoietin: 7.3  Counseling: The cause of elevated hemoglobin is secondary polycythemia. Treatment plan: Since the patient does not have any symptoms related to the polycythemia, this can be watched and monitored.  I did encourage the patient to donate blood every 3 months.

## 2020-04-13 ENCOUNTER — Telehealth: Payer: Self-pay | Admitting: Hematology and Oncology

## 2020-04-13 NOTE — Telephone Encounter (Signed)
No 1/17 los, no changes made to pt schedule 

## 2020-08-25 ENCOUNTER — Ambulatory Visit (INDEPENDENT_AMBULATORY_CARE_PROVIDER_SITE_OTHER): Payer: Managed Care, Other (non HMO)

## 2020-08-25 ENCOUNTER — Ambulatory Visit
Admission: EM | Admit: 2020-08-25 | Discharge: 2020-08-25 | Disposition: A | Discharge status: Home / Self Care | Payer: Managed Care, Other (non HMO) | Attending: Emergency Medicine | Admitting: Emergency Medicine

## 2020-08-25 DIAGNOSIS — W19XXXA Unspecified fall, initial encounter: Secondary | ICD-10-CM

## 2020-08-25 DIAGNOSIS — R1032 Left lower quadrant pain: Secondary | ICD-10-CM | POA: Diagnosis not present

## 2020-08-25 DIAGNOSIS — S20212A Contusion of left front wall of thorax, initial encounter: Secondary | ICD-10-CM

## 2020-08-25 MED ORDER — IBUPROFEN 800 MG PO TABS: 800.0000 mg | ORAL_TABLET | Freq: Three times a day (TID) | ORAL | 0 refills | Status: DC

## 2020-08-25 NOTE — Discharge Instructions (Addendum)
Xray normal Use anti-inflammatories for pain/swelling. You may take up to 800 mg Ibuprofen every 8 hours with food. You may supplement Ibuprofen with Tylenol [phone] mg every 8 hours.  Ice Breath deep/cough occasionally  Follow up if not improving or worsening

## 2020-08-25 NOTE — ED Provider Notes (Signed)
[name]    CSN: [id number] Arrival date & time: 08/25/20  1152      History   Chief Complaint Chief Complaint  Patient presents with  . Chest Pain    HPI Philip Cooley is a 55 y.o. male presenting today for evaluation of chest discomfort after fall.  Reports yesterday fell and landed onto his water bottle which hit him in his left lower chest.  Since he has had pain with certain movements coughing straining and deep inspiration.  Denies any abdominal pain nausea or vomiting.  Oral intake at baseline.  HPI  Past Medical History:  Diagnosis Date  . ACUTE GINGIVITIS NONPLAQUE INDUCED 09/07/2009  . ALLERGIC RHINITIS 06/17/2008  . Arthritis    hands and knees  . HYPERTENSION 06/17/2008    Patient Active Problem List   Diagnosis Date Noted  . Polycythemia 02/09/2020  . History of back strain 12/19/2018  . ACUTE GINGIVITIS NONPLAQUE INDUCED 09/07/2009  . Essential hypertension 06/17/2008  . Allergic rhinitis 06/17/2008    Past Surgical History:  Procedure Laterality Date  . FINGER FRACTURE SURGERY    . MASS EXCISION Right 08/14/2016   Procedure: EXCISIONAL BIOPSY MASS RIGHT SMALL FINGER;  Surgeon: Leanora Cover, MD;  Location: [facility];  Service: Orthopedics;  Laterality: Right;       Home Medications    Prior to Admission medications   Medication Sig Start Date End Date Taking? Authorizing Provider  ibuprofen (ADVIL) 800 MG tablet Take 1 tablet (800 mg total) by mouth 3 (three) times daily. 08/25/20  Yes Kenyon Eshleman C, PA-C  amLODipine (NORVASC) 10 MG tablet Take 1 tablet (10 mg total) by mouth daily. 02/05/20   Billie Ruddy, MD  cetirizine (ZYRTEC) 10 MG tablet Take 10 mg by mouth daily.    [provider]  fluticasone (FLONASE) 50 MCG/ACT nasal spray Place 2 sprays into both nostrils daily. 11/05/19   Ok Edwards, PA-C  meclizine (ANTIVERT) 12.5 MG tablet Take 1 tablet (12.5 mg total) by mouth 3 (three) times daily as needed  for dizziness. 11/05/19   Tasia Catchings, Amy V, PA-C  Multiple Vitamin (MULTIVITAMIN WITH MINERALS) TABS Take 1 tablet by mouth daily.    [provider]  ondansetron (ZOFRAN ODT) 4 MG disintegrating tablet Take 1 tablet (4 mg total) by mouth every 8 (eight) hours as needed for nausea or vomiting. 11/05/19   Ok Edwards, PA-C    Family History Family History  Problem Relation Age of Onset  . Diabetes Mother   . Cancer Mother   . Colon cancer Neg Hx     Social History Social History   Tobacco Use  . Smoking status: Never Smoker  . Smokeless tobacco: Never Used  Vaping Use  . Vaping Use: Never used  Substance Use Topics  . Alcohol use: No  . Drug use: No     Allergies   Penicillins   Review of Systems Review of Systems  Constitutional: Negative for fatigue and fever.  Eyes: Negative for redness, itching and visual disturbance.  Respiratory: Negative for shortness of breath.   Cardiovascular: Positive for chest pain. Negative for leg swelling.  Gastrointestinal: Negative for nausea and vomiting.  Musculoskeletal: Negative for arthralgias and myalgias.  Skin: Negative for color change, rash and wound.  Neurological: Negative for dizziness, syncope, weakness, light-headedness and headaches.     Physical Exam Triage Vital Signs ED Triage Vitals  Enc Vitals Group     BP 08/25/20 1218 123/87  Pulse Rate 08/25/20 1218 87     Resp 08/25/20 1218 16     Temp 08/25/20 1218 98 F (36.7 C)     Temp Source 08/25/20 1218 Oral     SpO2 08/25/20 1218 97 %     Weight --      Height --      Head Circumference --      Peak Flow --      Pain Score 08/25/20 1220 8     Pain Loc --      Pain Edu? --      Excl. in [place]? --    No data found.  Updated Vital Signs BP 123/87 (BP Location: Right Arm)   Pulse 87   Temp 98 F (36.7 C) (Oral)   Resp 16   SpO2 97%   Visual Acuity Right Eye Distance:   Left Eye Distance:   Bilateral Distance:    Right Eye Near:   Left Eye Near:     Bilateral Near:     Physical Exam Vitals and nursing note reviewed.  Constitutional:      Appearance: He is well-developed.     Comments: No acute distress  HENT:     Head: Normocephalic and atraumatic.     Nose: Nose normal.  Eyes:     Conjunctiva/sclera: Conjunctivae normal.  Cardiovascular:     Rate and Rhythm: Normal rate.  Pulmonary:     Effort: Pulmonary effort is normal. No respiratory distress.     Comments: Breathing comfortably at rest, CTABL, no wheezing, rales or other adventitious sounds auscultated   Abdominal:     General: There is no distension.  Musculoskeletal:        General: Normal range of motion.     Cervical back: Neck supple.     Comments: Left anterior chest tender to palpation below the nipple line, most prominent just underneath the pectoralis  Skin:    General: Skin is warm and dry.  Neurological:     Mental Status: He is alert and oriented to person, place, and time.      UC Treatments / Results  Labs (all labs ordered are listed, but only abnormal results are displayed) Labs Reviewed - No data to display  EKG   Radiology DG Ribs Unilateral W/Chest Left  Result Date: 08/25/2020 CLINICAL DATA:  Fall, LEFT anterior lower rib pain. EXAM: LEFT RIBS AND CHEST - 3+ VIEW COMPARISON:  December 19, 2018 FINDINGS: Trachea midline. Cardiomediastinal contours and hilar structures are normal. Lungs are clear.  No sign of effusion or pneumothorax. No acute skeletal process.  No displaced rib fracture. IMPRESSION: 1. No acute cardiopulmonary disease. 2. No displaced rib fracture. Electronically Signed   By: Zetta Bills M.D.   On: 08/25/2020 13:39    Procedures Procedures (including critical care time)  Medications Ordered in UC Medications - No data to display  Initial Impression / Assessment and Plan / UC Course  I have reviewed the triage vital signs and the nursing notes.  Pertinent labs & imaging results that were available during my  care of the patient were reviewed by me and considered in my medical decision making (see chart for details).     Chest x-ray negative for fractures, treating as chest contusion recommending anti-inflammatories ice and occasional deep inspiration to help prevent secondary pneumonia.  Discussed strict return precautions. Patient verbalized understanding and is agreeable with plan.  Final Clinical Impressions(s) / UC Diagnoses   Final diagnoses:  Contusion  of left chest wall, initial encounter     Discharge Instructions     Xray normal Use anti-inflammatories for pain/swelling. You may take up to 800 mg Ibuprofen every 8 hours with food. You may supplement Ibuprofen with Tylenol [phone] mg every 8 hours.  Ice Breath deep/cough occasionally  Follow up if not improving or worsening    ED Prescriptions    Medication Sig Dispense Auth. Provider   ibuprofen (ADVIL) 800 MG tablet Take 1 tablet (800 mg total) by mouth 3 (three) times daily. 21 tablet Eugenio Dollins, [place] C, PA-C     PDMP not reviewed this encounter.   Janith Lima, [place] 08/25/20 1443

## 2020-08-25 NOTE — ED Triage Notes (Signed)
Patient presents to Urgent Care with complaints of chest pain from fall. Pt states he fell yesterday and landed on his chest. He states his chest area pain increases with movement, coughing, and deep breathing.

## 2020-08-26 ENCOUNTER — Encounter: Payer: Self-pay | Admitting: Family Medicine

## 2020-08-26 ENCOUNTER — Ambulatory Visit (INDEPENDENT_AMBULATORY_CARE_PROVIDER_SITE_OTHER): Payer: Managed Care, Other (non HMO) | Admitting: Family Medicine

## 2020-08-26 ENCOUNTER — Other Ambulatory Visit: Payer: Self-pay

## 2020-08-26 VITALS — BP 136/102 | HR 85 | Temp 98.2°F | Wt 197.0 lb

## 2020-08-26 DIAGNOSIS — I10 Essential (primary) hypertension: Secondary | ICD-10-CM

## 2020-08-26 DIAGNOSIS — S20212D Contusion of left front wall of thorax, subsequent encounter: Secondary | ICD-10-CM

## 2020-08-26 DIAGNOSIS — J302 Other seasonal allergic rhinitis: Secondary | ICD-10-CM

## 2020-08-26 DIAGNOSIS — Z1211 Encounter for screening for malignant neoplasm of colon: Secondary | ICD-10-CM | POA: Diagnosis not present

## 2020-08-26 DIAGNOSIS — R202 Paresthesia of skin: Secondary | ICD-10-CM | POA: Diagnosis not present

## 2020-08-26 DIAGNOSIS — D751 Secondary polycythemia: Secondary | ICD-10-CM

## 2020-08-26 LAB — CBC WITH DIFFERENTIAL/PLATELET
Basophils Absolute: 0.1 10*3/uL (ref 0.0–0.1)
Basophils Relative: 0.9 % (ref 0.0–3.0)
Eosinophils Absolute: 0.2 10*3/uL (ref 0.0–0.7)
Eosinophils Relative: 3.7 % (ref 0.0–5.0)
HCT: 52.3 % — ABNORMAL HIGH (ref 39.0–52.0)
Hemoglobin: 17.7 g/dL — ABNORMAL HIGH (ref 13.0–17.0)
Lymphocytes Relative: 26 % (ref 12.0–46.0)
Lymphs Abs: 1.6 10*3/uL (ref 0.7–4.0)
MCHC: 33.9 g/dL (ref 30.0–36.0)
MCV: 87.1 fl (ref 78.0–100.0)
Monocytes Absolute: 0.6 10*3/uL (ref 0.1–1.0)
Monocytes Relative: 10.2 % (ref 3.0–12.0)
Neutro Abs: 3.6 10*3/uL (ref 1.4–7.7)
Neutrophils Relative %: 59.2 % (ref 43.0–77.0)
Platelets: 219 10*3/uL (ref 150.0–400.0)
RBC: 6 Mil/uL — ABNORMAL HIGH (ref 4.22–5.81)
RDW: 13.5 % (ref 11.5–15.5)
WBC: 6 10*3/uL (ref 4.0–10.5)

## 2020-08-26 LAB — FOLATE: Folate: 22.9 ng/mL (ref >5.9)

## 2020-08-26 LAB — TSH: TSH: 1.04 u[IU]/mL (ref 0.35–4.50)

## 2020-08-26 LAB — T4, FREE: Free T4: 0.7 ng/dL (ref 0.60–1.60)

## 2020-08-26 LAB — HEMOGLOBIN A1C: Hgb A1c MFr Bld: 6 % (ref 4.6–6.5)

## 2020-08-26 LAB — VITAMIN B12: Vitamin B-12: 525 pg/mL (ref 211–911)

## 2020-08-26 MED ORDER — MONTELUKAST SODIUM 10 MG PO TABS: 10.0000 mg | ORAL_TABLET | Freq: Every day | ORAL | 5 refills | Status: DC

## 2020-08-26 NOTE — Patient Instructions (Signed)
Orthopedic physical assessment (7th ed., pp. 164- 242). Elsevier.">  Paresthesia Paresthesia is an abnormal burning or prickling sensation. It is usually felt in the hands, arms, legs, or feet. However, it may occur in any part of the body. Usually, paresthesia is not painful. It may feel like:  Tingling or numbness.  Buzzing.  Itching. Paresthesia may occur without any clear cause, or it may be caused by:  Breathing too quickly (hyperventilation).  Pressure on a nerve.  An underlying medical condition.  Side effects of a medication.  Nutritional deficiencies.  Exposure to toxic chemicals. Most people experience temporary (transient) paresthesia at some time in their lives. For some people, it may be long-lasting (chronic) because of an underlying medical condition. If you have paresthesia that lasts a long time, you need to be evaluated by your health care provider. Follow these instructions at home: Alcohol use  Do not drink alcohol if: ? Your health care provider tells you not to drink. ? You are pregnant, may be pregnant, or are planning to become pregnant.  If you drink alcohol: ? Limit how much you use to:  0-1 drink a day for women.  0-2 drinks a day for men. ? Be aware of how much alcohol is in your drink. In the [place] one drink equals one 12 oz bottle of beer (355 mL), one 5 oz glass of wine (148 mL), or one 1 oz glass of hard liquor (44 mL).   Nutrition  Eat a healthy diet. This includes: ? Eating foods that are high in fiber, such as fresh fruits and vegetables, whole grains, and beans. ? Limiting foods that are high in fat and processed sugars, such as fried or sweet foods.   General instructions  Take over-the-counter and prescription medicines only as told by your health care provider.  Do not use any products that contain nicotine or tobacco, such as cigarettes and e-cigarettes. These can keep blood from reaching damaged nerves. If you need help quitting,  ask your health care provider.  If you have diabetes, work closely with your health care provider to keep your blood sugar under control.  If you have numbness in your feet: ? Check every day for signs of injury or infection. Watch for redness, warmth, and swelling. ? Wear padded socks and comfortable shoes. These help protect your feet.  Keep all follow-up visits as told by your health care provider. This is important. Contact a health care provider if you:  Have paresthesia that gets worse or does not go away.  Have numbness after an injury.  Have a burning or prickling feeling that gets worse when you walk.  Have pain, cramps, or dizziness, or you faint.  Develop a rash. Get help right away if you:  Feel muscle weakness.  Develop new weakness in an arm or leg.  Have trouble walking or moving.  Have problems with speech, understanding, or vision.  Feel confused.  Cannot control your bladder or bowel movements. Summary  Paresthesia is an abnormal burning or prickling sensation that is usually felt in the hands, arms, legs, or feet. It may also occur in other parts of the body.  Paresthesia may occur without any clear cause, or it may be caused by breathing too quickly (hyperventilation), pressure on a nerve, an underlying medical condition, side effects of a medication, nutritional deficiencies, or exposure to toxic chemicals.  If you have paresthesia that lasts a long time, you need to be evaluated by your health care provider.  This information is not intended to replace advice given to you by your health care provider. Make sure you discuss any questions you have with your health care provider. Document Revised: 12/22/2019 Document Reviewed: 12/22/2019 Elsevier Patient Education  2021 [employer].  Allergic Rhinitis, Adult  Allergic rhinitis is an allergic reaction that affects the mucous membrane inside the nose. The mucous membrane is the tissue that produces  mucus. There are two types of allergic rhinitis:  Seasonal. This type is also called hay fever and happens only during certain seasons.  Perennial. This type can happen at any time of the year. Allergic rhinitis cannot be spread from person to person. This condition can be mild, moderate, or severe. It can develop at any age and may be outgrown. What are the causes? This condition is caused by allergens. These are things that can cause an allergic reaction. Allergens may differ for seasonal allergic rhinitis and perennial allergic rhinitis.  Seasonal allergic rhinitis is triggered by pollen. Pollen can come from grasses, trees, and weeds.  Perennial allergic rhinitis may be triggered by: ? Dust mites. ? Proteins in a pet's urine, saliva, or dander. Dander is dead skin cells from a pet. ? Smoke, mold, or car fumes. What increases the risk? You are more likely to develop this condition if you have a family history of allergies or other conditions related to allergies, including:  Allergic conjunctivitis. This is inflammation of parts of the eyes and eyelids.  Asthma. This condition affects the lungs and makes it hard to breathe.  Atopic dermatitis or eczema. This is long term (chronic) inflammation of the skin.  Food allergies. What are the signs or symptoms? Symptoms of this condition include:  Sneezing or coughing.  A stuffy nose (nasal congestion), itchy nose, or nasal discharge.  Itchy eyes and tearing of the eyes.  A feeling of mucus dripping down the back of your throat (postnasal drip).  Trouble sleeping.  Tiredness or fatigue.  Headache.  Sore throat. How is this diagnosed? This condition may be diagnosed with your symptoms, medical history, and physical exam. Your health care provider may check for related conditions, such as:  Asthma.  Pink eye. This is eye inflammation caused by infection (conjunctivitis).  Ear infection.  Upper respiratory infection.  This is an infection in the nose, throat, or upper airways. You may also have tests to find out which allergens trigger your symptoms. These may include skin tests or blood tests. How is this treated? There is no cure for this condition, but treatment can help control symptoms. Treatment may include:  Taking medicines that block allergy symptoms, such as corticosteroids and antihistamines. Medicine may be given as a shot, nasal spray, or pill.  Avoiding any allergens.  Being exposed again and again to tiny amounts of allergens to help you build a defense against allergens (immunotherapy). This is done if other treatments have not helped. It may include: ? Allergy shots. These are injected medicines that have small amounts of allergen in them. ? Sublingual immunotherapy. This involves taking small doses of a medicine with allergen in it under your tongue. If these treatments do not work, your health care provider may prescribe newer, stronger medicines. Follow these instructions at home: Avoiding allergens Find out what you are allergic to and avoid those allergens. These are some things you can do to help avoid allergens:  If you have perennial allergies: ? Replace carpet with wood, tile, or vinyl flooring. Carpet can trap dander and dust. ?  Do not smoke. Do not allow smoking in your home. ? Change your heating and air conditioning filters at least once a month.  If you have seasonal allergies, take these steps during allergy season: ? Keep windows closed as much as possible. ? Plan outdoor activities when pollen counts are lowest. Check pollen counts before you plan outdoor activities. ? When coming indoors, change clothing and shower before sitting on furniture or bedding.  If you have a pet in the house that produces allergens: ? Keep the pet out of the bedroom. ? Vacuum, sweep, and dust regularly. General instructions  Take over-the-counter and prescription medicines only as told  by your health care provider.  Drink enough fluid to keep your urine pale yellow.  Keep all follow-up visits as told by your health care provider. This is important. Where to find more information  American Academy of Allergy, Asthma & Immunology: www.aaaai.org Contact a health care provider if:  You have a fever.  You develop a cough that does not go away.  You make whistling sounds when you breathe (wheeze).  Your symptoms slow you down or stop you from doing your normal activities each day. Get help right away if:  You have shortness of breath. This symptom may represent a serious problem that is an emergency. Do not wait to see if the symptom will go away. Get medical help right away. Call your local emergency services (911 in the [place] Do not drive yourself to the hospital. Summary  Allergic rhinitis may be managed by taking medicines as directed and avoiding allergens.  If you have seasonal allergies, keep windows closed as much as possible during allergy season.  Contact your health care provider if you develop a fever or a cough that does not go away. This information is not intended to replace advice given to you by your health care provider. Make sure you discuss any questions you have with your health care provider. Document Revised: 05/01/2019 Document Reviewed: 03/10/2019 Elsevier Patient Education  2021 [place].  Rib Contusion A rib contusion is a deep bruise on the rib area. Contusions are the result of a blunt trauma that causes bleeding and injury to the tissues under the skin. A rib contusion may involve bruising of the ribs and of the skin and muscles in the area. The skin over the contusion may turn blue, purple, or yellow. Minor injuries result in a painless contusion. More severe contusions may be painful and swollen for a few weeks. What are the causes? This condition is usually caused by a hard, direct hit to an area of the body. This often occurs  while playing contact sports. What are the signs or symptoms? Symptoms of this condition include:  Swelling and redness of the injured area.  Discoloration of the injured area.  Tenderness and soreness of the injured area.  Pain with or without movement.  Pain when breathing in. How is this diagnosed? This condition may be diagnosed based on:  Your symptoms and medical history.  A physical exam.  Imaging tests--such as an X-ray, CT scan, or MRI--to determine if there were internal injuries or broken bones (fractures). How is this treated? This condition may be treated with:  Rest. This is often the best treatment for a rib contusion.  Ice packs. This reduces swelling and inflammation.  Deep-breathing exercises. These may be recommended to reduce the risk for lung collapse and pneumonia.  Medicines. Over-the-counter or prescription medicines may be given to control pain.  Injection of a numbing medicine around the nerve near your injury (nerve block). Follow these instructions at home: Medicines  Take over-the-counter and prescription medicines only as told by your health care provider.  Ask your health care provider if the medicine prescribed to you: ? Requires you to avoid driving or using machinery. ? Can cause constipation. You may need to take these actions to prevent or treat constipation:  Drink enough fluid to keep your urine pale yellow.  Take over-the-counter or prescription medicines.  Eat foods that are high in fiber, such as beans, whole grains, and fresh fruits and vegetables.  Limit foods that are high in fat and processed sugars, such as fried or sweet foods. Managing pain, stiffness, and swelling If directed, put ice on the injured area. To do this:  Put ice in a plastic bag.  Place a towel between your skin and the bag.  Leave the ice on for 20 minutes, 2-3 times a day.  Remove the ice if your skin turns bright red. This is very important. If  you cannot feel pain, heat, or cold, you have a greater risk of damage to the area.   Activity  Rest the injured area.  Avoid strenuous activity and any activities or movements that cause pain. Be careful during activities, and avoid bumping the injured area.  Do not lift anything that is heavier than 5 lb (2.3 kg), or the limit that you are told, until your health care provider says that it is safe. General instructions  Do not use any products that contain nicotine or tobacco, such as cigarettes, e-cigarettes, and chewing tobacco. These can delay healing. If you need help quitting, ask your health care provider.  Do deep-breathing exercises as told by your health care provider.  If you were given an incentive spirometer, use it every 1-2 hours while you are awake, or as recommended by your health care provider. This device measures how well you are filling your lungs with each breath.  Keep all follow-up visits. This is important.   Contact a health care provider if you have:  Increased bruising or swelling.  Pain that is not controlled with treatment.  A fever. Get help right away if you:  Have difficulty breathing or shortness of breath.  Develop a continual cough, or you cough up thick or bloody mucus from your lungs (sputum).  Feel nauseous or you vomit.  Have pain in your abdomen. These symptoms may represent a serious problem that is an emergency. Do not wait to see if the symptoms will go away. Get medical help right away. Call your local emergency services (911 in the [place] Do not drive yourself to the hospital. Summary  A rib contusion is a deep bruise on your rib area. Contusions are the result of a blunt trauma that causes bleeding and injury to the tissues under the skin.  The skin over the contusion may turn blue, purple, or yellow. Minor injuries may cause a painless contusion. More severe contusions may be painful and swollen for a few weeks.  Rest the  injured area. Avoid strenuous activity and any activities or movements that cause pain. This information is not intended to replace advice given to you by your health care provider. Make sure you discuss any questions you have with your health care provider. Document Revised: 06/17/2019 Document Reviewed: 06/17/2019 Elsevier Patient Education  [place].

## 2020-08-26 NOTE — Progress Notes (Signed)
Subjective:    Patient ID: Philip Cooley, male    DOB: 08/28/1965, 55 y.o.   MRN: [id number]  Chief Complaint  Patient presents with  . Fall    Tripped over cable and landed on metal water bottle. Went to [facility] 6/02     HPI Patient was seen today for f/u on acute and ongoing concerns.  Pt seen at [facility] 08/25/20 s/p tripping over a cable and landing on ground with chest against a metal water bottle.  Pt notes soreness in L mid chest.  CXR negative.  Pt taking ibuprofen.  Pt having the sensation of the hair on his thighs being pulled.  Also feels numbness/tingling in b/l thighs/skin sensitivity.  Can tell when skin is touched but it's not a normal touch.    Inquires about prescription allergy medications.  Currently taking Xyzal which helps control majority of symptoms with exception of eye irritation.  Patient tried OTC allergy eyedrops without improvement in symptoms.  Other OTC medications such as Allegra, Zyrtec, Claritin.  Pt gave blood recently.  Was advised his hgb was 19.6.  Plans to donate blood more frequently.  Patient notes improvement 2/2 consistent stretching twice daily.  Past Medical History:  Diagnosis Date  . ACUTE GINGIVITIS NONPLAQUE INDUCED 09/07/2009  . ALLERGIC RHINITIS 06/17/2008  . Arthritis    hands and knees  . HYPERTENSION 06/17/2008    Allergies  Allergen Reactions  . Penicillins Swelling    ROS General: Denies fever, chills, night sweats, changes in weight, changes in appetite HEENT: Denies headaches, ear pain, changes in vision, rhinorrhea, sore throat CV: Denies CP, palpitations, SOB, orthopnea Pulm: Denies SOB, cough, wheezing GI: Denies abdominal pain, nausea, vomiting, diarrhea, constipation GU: Denies dysuria, hematuria, frequency, vaginal discharge Msk: Denies muscle cramps, joint pains Neuro: Denies weakness, numbness, tingling Skin: Denies rashes, bruising Psych: Denies depression, anxiety, hallucinations      Objective:    Blood pressure (!)  136/102, pulse 85, temperature 98.2 F (36.8 C), temperature source Oral, weight 197 lb (89.4 kg), SpO2 98 %.  Gen. Pleasant, well-nourished, in no distress, normal affect   HEENT: Seth Ward/AT, face symmetric, conjunctiva clear, no scleral icterus, PERRLA, EOMI, nares patent without drainage, pharynx without erythema or exudate. Neck: No JVD, no thyromegaly, no carotid bruits Lungs: no accessory muscle use, CTAB, no wheezes or rales Cardiovascular: RRR, no m/r/g, no peripheral edema Abdomen: BS present, soft, NT/ND, no hepatosplenomegaly. Musculoskeletal: No deformities, no cyanosis or clubbing, normal tone Neuro:  A&Ox3, CN II-XII intact, normal gait.  Intact and equal to soft and pinprick touch in bilateral LEs.  Difficulty with pinprick discrimination at proximal thighs.  No TTP of cervical, thoracic, lumbar spine.  Normal strength bilaterally in LEs. Skin:  Warm, no lesions/ rash   Wt Readings from Last 3 Encounters:  08/26/20 197 lb (89.4 kg)  03/14/20 199 lb 3.2 oz (90.4 kg)  02/05/20 201 lb (91.2 kg)    Lab Results  Component Value Date   WBC 6.6 03/15/2020   HGB 18.0 (H) 03/15/2020   HCT 51.5 03/15/2020   PLT 219 03/15/2020   GLUCOSE 89 02/05/2020   CHOL 169 02/05/2020   TRIG 71 02/05/2020   HDL 56 02/05/2020   LDLCALC 97 02/05/2020   ALT 26 02/05/2020   AST 22 02/05/2020   NA 139 02/05/2020   K 4.0 02/05/2020   CL 102 02/05/2020   CREATININE 1.15 02/05/2020   BUN 11 02/05/2020   CO2 25 02/05/2020   TSH 0.99 02/05/2020  PSA 0.85 02/05/2020   HGBA1C 5.7 (H) 02/05/2020    Assessment/Plan:  Paresthesia - Plan: Vitamin B12, CBC with Differential/Platelet, CMP, TSH, T4, Free, Folate, Magnesium, Hemoglobin A1c  Seasonal allergies  - Plan: montelukast (SINGULAIR) 10 MG tablet  Contusion of rib on left side, subsequent encounter -CXR negative for fracture on 08/25/2020 -Continue supportive care  Screening for colon cancer -Recall colonoscopy due this year - Plan:  Ambulatory referral to Gastroenterology  Essential hypertension -Uncontrolled today -Recheck -Per chart review 123/87 yesterday 08/25/2020 at [facility] and previously controlled -Patient encouraged to check BP at home and keep a log to bring to next -Continue consistent use of Norvasc 10 mg  Polycythemia -Asymptomatic -Per patient hemoglobin 19.6 recently -Per chart review elevated 2012 and increasing -Further work-up/rheumatology referral. -Okay to continue more frequent blood donation -Plan: CBC with differential/platelet  F/u in 1 month, sooner if needed  Grier Mitts, MD

## 2020-08-29 LAB — COMPREHENSIVE METABOLIC PANEL
ALT: 19 U/L (ref 0–53)
AST: 19 U/L (ref 0–37)
Albumin: 4.1 g/dL (ref 3.5–5.2)
Alkaline Phosphatase: 57 U/L (ref 39–117)
BUN: 14 mg/dL (ref 6–23)
CO2: 22 mEq/L (ref 19–32)
Calcium: 9.2 mg/dL (ref 8.4–10.5)
Chloride: 102 mEq/L (ref 96–112)
Creatinine, Ser: 1.11 mg/dL (ref 0.40–1.50)
GFR: 74.69 mL/min (ref >60.00)
Glucose, Bld: 90 mg/dL (ref 70–99)
Potassium: 4 mEq/L (ref 3.5–5.1)
Sodium: 137 mEq/L (ref 135–145)
Total Bilirubin: 0.6 mg/dL (ref 0.2–1.2)
Total Protein: 7.1 g/dL (ref 6.0–8.3)

## 2020-08-29 LAB — MAGNESIUM: Magnesium: 2 mg/dL (ref 1.5–2.5)

## 2020-09-01 ENCOUNTER — Ambulatory Visit
Admission: EM | Admit: 2020-09-01 | Discharge: 2020-09-01 | Disposition: A | Discharge status: Home / Self Care | Payer: Managed Care, Other (non HMO) | Attending: Family Medicine | Admitting: Family Medicine

## 2020-09-01 ENCOUNTER — Other Ambulatory Visit: Payer: Self-pay

## 2020-09-01 DIAGNOSIS — J029 Acute pharyngitis, unspecified: Secondary | ICD-10-CM

## 2020-09-01 LAB — POCT RAPID STREP A (OFFICE): Rapid Strep A Screen: NEGATIVE

## 2020-09-01 MED ORDER — DEXAMETHASONE SODIUM PHOSPHATE 10 MG/ML IJ SOLN
10.0000 mg | Freq: Once | INTRAMUSCULAR | Status: AC
  Administered 2020-09-01: 10 mg via INTRAMUSCULAR

## 2020-09-01 MED ORDER — LIDOCAINE VISCOUS HCL 2 % MT SOLN: 10.0000 mL | OROMUCOSAL | 0 refills | Status: DC | PRN

## 2020-09-01 NOTE — ED Provider Notes (Signed)
[name]    CSN: [id number] Arrival date & time: 09/01/20  1342      History   Chief Complaint Chief Complaint  Patient presents with   Sore Throat    HPI Philip Cooley is a 55 y.o. male.   Presenting today with 2-day history of left-sided sore throat that feels swollen.  States the symptoms are worsening since onset.  Denies fever, chills, dysphagia, trouble breathing, congestion, cough, abdominal pain, nausea vomiting or diarrhea.  Does have a history of seasonal allergies, just started on Singulair through PCP.  Stopped Zyrtec and Flonase as he does not feel like it was helping.  Denies any known sick contacts.  Tried some NyQuil with minimal relief last night.   Past Medical History:  Diagnosis Date   ACUTE GINGIVITIS NONPLAQUE INDUCED 09/07/2009   ALLERGIC RHINITIS 06/17/2008   Arthritis    hands and knees   HYPERTENSION 06/17/2008    Patient Active Problem List   Diagnosis Date Noted   Polycythemia 02/09/2020   History of back strain 12/19/2018   ACUTE GINGIVITIS NONPLAQUE INDUCED 09/07/2009   Essential hypertension 06/17/2008   Allergic rhinitis 06/17/2008    Past Surgical History:  Procedure Laterality Date   FINGER FRACTURE SURGERY     MASS EXCISION Right 08/14/2016   Procedure: EXCISIONAL BIOPSY MASS RIGHT SMALL FINGER;  Surgeon: Leanora Cover, MD;  Location: [place];  Service: Orthopedics;  Laterality: Right;       Home Medications    Prior to Admission medications   Medication Sig Start Date End Date Taking? Authorizing Provider  lidocaine (XYLOCAINE) 2 % solution Use as directed 10 mLs in the mouth or throat as needed for mouth pain. 09/01/20  Yes Volney American, PA-C  amLODipine (NORVASC) 10 MG tablet Take 1 tablet (10 mg total) by mouth daily. 02/05/20   Billie Ruddy, MD  cetirizine (ZYRTEC) 10 MG tablet Take 10 mg by mouth daily. Patient not taking: No sig reported    [provider]  fluticasone  (FLONASE) 50 MCG/ACT nasal spray Place 2 sprays into both nostrils daily. Patient not taking: No sig reported 11/05/19   Ok Edwards, PA-C  ibuprofen (ADVIL) 800 MG tablet Take 1 tablet (800 mg total) by mouth 3 (three) times daily. 08/25/20   Wieters, Hallie C, PA-C  meclizine (ANTIVERT) 12.5 MG tablet Take 1 tablet (12.5 mg total) by mouth 3 (three) times daily as needed for dizziness. Patient not taking: No sig reported 11/05/19   Tasia Catchings, Amy V, PA-C  montelukast (SINGULAIR) 10 MG tablet Take 1 tablet (10 mg total) by mouth at bedtime. 08/26/20   Billie Ruddy, MD  Multiple Vitamin (MULTIVITAMIN WITH MINERALS) TABS Take 1 tablet by mouth daily.    [provider]  ondansetron (ZOFRAN ODT) 4 MG disintegrating tablet Take 1 tablet (4 mg total) by mouth every 8 (eight) hours as needed for nausea or vomiting. Patient not taking: No sig reported 11/05/19   Ok Edwards, PA-C    Family History Family History  Problem Relation Age of Onset   Diabetes Mother    Cancer Mother    Colon cancer Neg Hx     Social History Social History   Tobacco Use   Smoking status: Never   Smokeless tobacco: Never  Vaping Use   Vaping Use: Never used  Substance Use Topics   Alcohol use: No   Drug use: No     Allergies   Penicillins  Review of Systems Review of Systems Per HPI  Physical Exam Triage Vital Signs ED Triage Vitals  Enc Vitals Group     BP 09/01/20 1401 128/87     Pulse Rate 09/01/20 1401 76     Resp 09/01/20 1401 16     Temp 09/01/20 1401 97.8 F (36.6 C)     Temp Source 09/01/20 1401 Oral     SpO2 09/01/20 1401 96 %     Weight --      Height --      Head Circumference --      Peak Flow --      Pain Score 09/01/20 1400 6     Pain Loc --      Pain Edu? --      Excl. in [place]? --    No data found.  Updated Vital Signs BP 128/87 (BP Location: Left Arm)   Pulse 76   Temp 97.8 F (36.6 C) (Oral)   Resp 16   SpO2 96%   Visual Acuity Right Eye Distance:   Left Eye  Distance:   Bilateral Distance:    Right Eye Near:   Left Eye Near:    Bilateral Near:     Physical Exam Vitals and nursing note reviewed.  Constitutional:      Appearance: Normal appearance.  HENT:     Head: Atraumatic.     Right Ear: Tympanic membrane normal.     Left Ear: Tympanic membrane normal.     Nose: Nose normal.     Mouth/Throat:     Mouth: Mucous membranes are moist.     Pharynx: Oropharynx is clear. Posterior oropharyngeal erythema present. No oropharyngeal exudate.     Comments: Tonsillar erythema and mild edema, slightly worse on the left Eyes:     Extraocular Movements: Extraocular movements intact.     Conjunctiva/sclera: Conjunctivae normal.  Cardiovascular:     Rate and Rhythm: Normal rate and regular rhythm.     Heart sounds: Normal heart sounds.  Pulmonary:     Effort: Pulmonary effort is normal. No respiratory distress.     Breath sounds: Normal breath sounds. No wheezing or rales.  Abdominal:     General: Bowel sounds are normal. There is no distension.     Palpations: Abdomen is soft.     Tenderness: There is no abdominal tenderness. There is no guarding.  Musculoskeletal:        General: Normal range of motion.     Cervical back: Normal range of motion and neck supple.  Skin:    General: Skin is warm and dry.  Neurological:     General: No focal deficit present.     Mental Status: He is oriented to person, place, and time.  Psychiatric:        Mood and Affect: Mood normal.        Thought Content: Thought content normal.        Judgment: Judgment normal.     UC Treatments / Results  Labs (all labs ordered are listed, but only abnormal results are displayed) Labs Reviewed  CULTURE, GROUP A STREP [facility])  POCT RAPID STREP A (OFFICE)    EKG   Radiology No results found.  Procedures Procedures (including critical care time)  Medications Ordered in UC Medications  dexamethasone (DECADRON) injection 10 mg (10 mg Intramuscular Given  09/01/20 1512)    Initial Impression / Assessment and Plan / UC Course  I have reviewed the triage vital signs and  the nursing notes.  Pertinent labs & imaging results that were available during my care of the patient were reviewed by me and considered in my medical decision making (see chart for details).     Vital signs benign, rapid strep negative.  Throat culture pending.  Suspect inflammatory cause at this time and will treat with IM Decadron, viscous lidocaine as needed.  Continue allergy regimen, salt water gargles and other home care.  Return for acutely worsening symptoms.  Final Clinical Impressions(s) / UC Diagnoses   Final diagnoses:  Acute pharyngitis, unspecified etiology   Discharge Instructions   None    ED Prescriptions     Medication Sig Dispense Auth. Provider   lidocaine (XYLOCAINE) 2 % solution Use as directed 10 mLs in the mouth or throat as needed for mouth pain. 100 mL Volney American, [place]      PDMP not reviewed this encounter.   Volney American, [place] 09/01/20 1516

## 2020-09-01 NOTE — ED Triage Notes (Addendum)
Patient presents to Urgent Care with complaints of burning sensation on left side of throat x 2 days ago. Treating symptoms with nyquil. He states he has a hx of seasonal allergies. Recently prescribed allergy meds has not taken.   Denies fever.

## 2020-09-04 LAB — CULTURE, GROUP A STREP (THRC)

## 2020-12-26 ENCOUNTER — Encounter: Payer: Self-pay | Admitting: Family Medicine

## 2020-12-26 ENCOUNTER — Telehealth (INDEPENDENT_AMBULATORY_CARE_PROVIDER_SITE_OTHER): Payer: Managed Care, Other (non HMO) | Admitting: Family Medicine

## 2020-12-26 DIAGNOSIS — R058 Other specified cough: Secondary | ICD-10-CM

## 2020-12-26 MED ORDER — BENZONATATE 100 MG PO CAPS: 100.0000 mg | ORAL_CAPSULE | Freq: Two times a day (BID) | ORAL | 0 refills | Status: DC | PRN

## 2020-12-26 NOTE — Progress Notes (Signed)
Virtual Visit via Telephone Note  I connected with Philip Cooley on 12/26/20 at  4:30 PM EDT by telephone and verified that I am speaking with the correct person using two identifiers.   I discussed the limitations, risks, security and privacy concerns of performing an evaluation and management service by telephone and the availability of in person appointments. I also discussed with the patient that there may be a patient responsible charge related to this service. The patient expressed understanding and agreed to proceed.  Location patient: home Location provider: work or home office Participants present for the call: patient, provider Patient did not have a visit in the prior 7 days to address this/these issue(s).  Chief Complaint  Patient presents with   Cough    Had a cold a few weeks ago, cough and congestion went away, but has come back. Feels like something is in his throat, congestion in chest but no fever. Cough is intense, comes when he talks more and at night.     History of Present Illness: Pt endorses cough x 3 wks.  Initially had chills, but the productive cough continued. Coughs more with increased talking.  Taking OTC meds, but stopped as he has HTN.  Feels like if is hard to get the phlegm up when coughing.  Has to cough several times before the phlegm comes up.  Denies ear pain/pressure, fever, HA, rhinorrhea, difficulty falling asleep.  Pt's son was sick around the same time he had the initial symptoms.   Observations/Objective: Patient sounds cheerful and well on the phone.  Mild hoarseness in voice and intermittent cough. I do not appreciate any SOB. Speech and thought processing are grossly intact. Patient reported vitals:   Assessment and Plan: Post-viral cough syndrome  -continue supportive care including warm fluids, rest, hydration, steam, Vicks VapoRub, OTC cough/cold medications for people with high blood pressure. -Given strict precautions for  increased/worsened symptoms including fever, recurrent chills, etc. - Plan: benzonatate (TESSALON) 100 MG capsule  Polycythemia  -Hemoglobin remains elevated -Plans to donate blood next week -Continue follow-up with hematology/oncology  Follow Up Instructions:  [id number] 5-10 [id number] 11-20 9443 21-30 I did not refer this patient for an OV in the next 24 hours for this/these issue(s).  I discussed the assessment and treatment plan with the patient. The patient was provided an opportunity to ask questions and all were answered. The patient agreed with the plan and demonstrated an understanding of the instructions.   The patient was advised to call back or seek an in-person evaluation if the symptoms worsen or if the condition fails to improve as anticipated.  I provided 13 minutes of non-face-to-face time during this encounter.  Billie Ruddy, MD

## 2021-01-09 ENCOUNTER — Encounter: Payer: Self-pay | Admitting: Emergency Medicine

## 2021-01-09 ENCOUNTER — Other Ambulatory Visit: Payer: Self-pay

## 2021-01-09 ENCOUNTER — Ambulatory Visit
Admission: EM | Admit: 2021-01-09 | Discharge: 2021-01-09 | Disposition: A | Discharge status: Home / Self Care | Payer: Managed Care, Other (non HMO) | Attending: Internal Medicine | Admitting: Internal Medicine

## 2021-01-09 ENCOUNTER — Ambulatory Visit (INDEPENDENT_AMBULATORY_CARE_PROVIDER_SITE_OTHER): Payer: Managed Care, Other (non HMO)

## 2021-01-09 DIAGNOSIS — R053 Chronic cough: Secondary | ICD-10-CM | POA: Diagnosis not present

## 2021-01-09 DIAGNOSIS — R059 Cough, unspecified: Secondary | ICD-10-CM | POA: Diagnosis not present

## 2021-01-09 MED ORDER — PREDNISONE 10 MG (21) PO TBPK: ORAL_TABLET | Freq: Every day | ORAL | 0 refills | Status: DC

## 2021-01-09 NOTE — ED Provider Notes (Signed)
[name]    CSN: [id number] Arrival date & time: 01/09/21  1224      History   Chief Complaint Chief Complaint  Patient presents with   Cough    HPI [name] is a 55 y.o. male.   Patient presents with productive cough that has been present for 3 weeks.  Patient reports the cough is productive with yellow to white sputum.  Denies any recent fevers.  Patient reports that he had nasal congestion and chills at the beginning of symptoms that lasted for 1 week and has now resolved.  Cough has been persistent.  Patient was seen by televisit 12/26/2020  by PCP for cough and was prescribed benzonatate that has not provided patient any relief.  Denies any chest pain or shortness of breath.  Denies asthma, COPD, or any chronic lung diseases.   Cough  Past Medical History:  Diagnosis Date   ACUTE GINGIVITIS NONPLAQUE INDUCED 09/07/2009   ALLERGIC RHINITIS 06/17/2008   Arthritis    hands and knees   HYPERTENSION 06/17/2008    Patient Active Problem List   Diagnosis Date Noted   Polycythemia 02/09/2020   History of back strain 12/19/2018   ACUTE GINGIVITIS NONPLAQUE INDUCED 09/07/2009   Essential hypertension 06/17/2008   Allergic rhinitis 06/17/2008    Past Surgical History:  Procedure Laterality Date   FINGER FRACTURE SURGERY     MASS EXCISION Right 08/14/2016   Procedure: EXCISIONAL BIOPSY MASS RIGHT SMALL FINGER;  Surgeon: Leanora Cover, MD;  Location: [place];  Service: Orthopedics;  Laterality: Right;       Home Medications    Prior to Admission medications   Medication Sig Start Date End Date Taking? Authorizing Provider  predniSONE (STERAPRED UNI-PAK 21 TAB) 10 MG (21) TBPK tablet Take by mouth daily. Take 6 tabs by mouth daily  for 2 days, then 5 tabs for 2 days, then 4 tabs for 2 days, then 3 tabs for 2 days, 2 tabs for 2 days, then 1 tab by mouth daily for 2 days 01/09/21  Yes Odis Luster, FNP  amLODipine (NORVASC) 10 MG  tablet Take 1 tablet (10 mg total) by mouth daily. 02/05/20   Billie Ruddy, MD  benzonatate (TESSALON) 100 MG capsule Take 1 capsule (100 mg total) by mouth 2 (two) times daily as needed for cough. 12/26/20   Billie Ruddy, MD  cetirizine (ZYRTEC) 10 MG tablet Take 10 mg by mouth daily. Patient not taking: No sig reported    [provider]  fluticasone (FLONASE) 50 MCG/ACT nasal spray Place 2 sprays into both nostrils daily. Patient not taking: No sig reported 11/05/19   Ok Edwards, PA-C  ibuprofen (ADVIL) 800 MG tablet Take 1 tablet (800 mg total) by mouth 3 (three) times daily. 08/25/20   Wieters, Hallie C, PA-C  lidocaine (XYLOCAINE) 2 % solution Use as directed 10 mLs in the mouth or throat as needed for mouth pain. 09/01/20   Volney American, PA-C  meclizine (ANTIVERT) 12.5 MG tablet Take 1 tablet (12.5 mg total) by mouth 3 (three) times daily as needed for dizziness. Patient not taking: No sig reported 11/05/19   Tasia Catchings, Amy V, PA-C  montelukast (SINGULAIR) 10 MG tablet Take 1 tablet (10 mg total) by mouth at bedtime. 08/26/20   Billie Ruddy, MD  Multiple Vitamin (MULTIVITAMIN WITH MINERALS) TABS Take 1 tablet by mouth daily.    [provider]  ondansetron (ZOFRAN ODT) 4 MG disintegrating  tablet Take 1 tablet (4 mg total) by mouth every 8 (eight) hours as needed for nausea or vomiting. Patient not taking: No sig reported 11/05/19   Ok Edwards, PA-C    Family History Family History  Problem Relation Age of Onset   Diabetes Mother    Cancer Mother    Colon cancer Neg Hx     Social History Social History   Tobacco Use   Smoking status: Never   Smokeless tobacco: Never  Vaping Use   Vaping Use: Never used  Substance Use Topics   Alcohol use: No   Drug use: No     Allergies   Penicillins   Review of Systems Review of Systems Per HPI  Physical Exam Triage Vital Signs ED Triage Vitals [01/09/21 1425]  Enc Vitals Group     BP (!) 146/102      Pulse Rate 66     Resp 16     Temp 98 F (36.7 C)     Temp Source Oral     SpO2 95 %     Weight      Height      Head Circumference      Peak Flow      Pain Score 0     Pain Loc      Pain Edu?      Excl. in [place]?    No data found.  Updated Vital Signs BP (!) 146/102 (BP Location: Left Arm)   Pulse 66   Temp 98 F (36.7 C) (Oral)   Resp 16   SpO2 95%   Visual Acuity Right Eye Distance:   Left Eye Distance:   Bilateral Distance:    Right Eye Near:   Left Eye Near:    Bilateral Near:     Physical Exam Constitutional:      General: He is not in acute distress.    Appearance: Normal appearance. He is not toxic-appearing or diaphoretic.  HENT:     Head: Normocephalic and atraumatic.     Right Ear: Tympanic membrane and ear canal normal.     Left Ear: Tympanic membrane and ear canal normal.     Nose: Nose normal.     Mouth/Throat:     Mouth: Mucous membranes are moist.     Pharynx: No posterior oropharyngeal erythema.  Eyes:     Extraocular Movements: Extraocular movements intact.     Conjunctiva/sclera: Conjunctivae normal.     Pupils: Pupils are equal, round, and reactive to light.  Cardiovascular:     Rate and Rhythm: Normal rate and regular rhythm.     Pulses: Normal pulses.     Heart sounds: Normal heart sounds.  Pulmonary:     Effort: Pulmonary effort is normal. No respiratory distress.     Breath sounds: Normal breath sounds. No stridor. No wheezing or rhonchi.  Skin:    General: Skin is warm and dry.  Neurological:     General: No focal deficit present.     Mental Status: He is alert and oriented to person, place, and time. Mental status is at baseline.  Psychiatric:        Mood and Affect: Mood normal.        Behavior: Behavior normal.        Thought Content: Thought content normal.        Judgment: Judgment normal.     UC Treatments / Results  Labs (all labs ordered are listed, but only abnormal results are  displayed) Labs Reviewed - No data  to display  EKG   Radiology DG Chest 2 View  Result Date: 01/09/2021 CLINICAL DATA:  Persistent cough for 3 weeks EXAM: CHEST - 2 VIEW COMPARISON:  Chest radiograph 08/25/2020 FINDINGS: The cardiomediastinal silhouette is normal. There is no focal consolidation or pulmonary edema. There is no pleural effusion or pneumothorax. There is no acute osseous abnormality. IMPRESSION: Stable chest with no radiographic evidence of acute cardiopulmonary process. Electronically Signed   By: Valetta Mole M.D.   On: 01/09/2021 15:52    Procedures Procedures (including critical care time)  Medications Ordered in UC Medications - No data to display  Initial Impression / Assessment and Plan / UC Course  I have reviewed the triage vital signs and the nursing notes.  Pertinent labs & imaging results that were available during my care of the patient were reviewed by me and considered in my medical decision making (see chart for details).     Chest x-ray was negative for any acute cardiopulmonary abnormality.  Suspect this cough is from viral etiology.  Most likely viral bronchitis.  Will treat with prednisone steroid to decrease inflammation in chest.  Patient may continue benzonatate as needed for cough.  No red flags seen on exam and no signs of respiratory distress.Discussed strict return precautions. Patient verbalized understanding and is agreeable with plan.  Final Clinical Impressions(s) / UC Diagnoses   Final diagnoses:  Persistent cough for 3 weeks or longer     Discharge Instructions      You have been prescribed prednisone steroid to decrease inflammation in chest and help alleviate cough.  Please follow-up with primary care if symptoms persist.     ED Prescriptions     Medication Sig Dispense Auth. Provider   predniSONE (STERAPRED UNI-PAK 21 TAB) 10 MG (21) TBPK tablet Take by mouth daily. Take 6 tabs by mouth daily  for 2 days, then 5 tabs for 2 days, then 4 tabs for 2 days, then  3 tabs for 2 days, 2 tabs for 2 days, then 1 tab by mouth daily for 2 days 42 tablet Odis Luster, FNP      PDMP not reviewed this encounter.   Odis Luster, [place] 01/09/21 1606

## 2021-01-09 NOTE — Discharge Instructions (Signed)
You have been prescribed prednisone steroid to decrease inflammation in chest and help alleviate cough.  Please follow-up with primary care if symptoms persist.

## 2021-01-09 NOTE — ED Triage Notes (Signed)
Cough on-going x 3 weeks, started after a cold. Upper respiratory symptoms went away, cough stayed. Denies smoking history, fever. PCP gave him tessalon pearls without relief, said they wanted to get an x-ray on him but couldn't get one soon. Was referred here per patient. Dry cough, talking irritates it.

## 2021-01-25 ENCOUNTER — Other Ambulatory Visit: Payer: Self-pay

## 2021-01-26 ENCOUNTER — Ambulatory Visit (INDEPENDENT_AMBULATORY_CARE_PROVIDER_SITE_OTHER): Payer: Managed Care, Other (non HMO) | Admitting: Family Medicine

## 2021-01-26 ENCOUNTER — Encounter: Payer: Self-pay | Admitting: Family Medicine

## 2021-01-26 VITALS — BP 124/88 | HR 85 | Temp 98.4°F | Wt 198.4 lb

## 2021-01-26 DIAGNOSIS — I10 Essential (primary) hypertension: Secondary | ICD-10-CM | POA: Diagnosis not present

## 2021-01-26 DIAGNOSIS — M545 Low back pain, unspecified: Secondary | ICD-10-CM

## 2021-01-26 MED ORDER — AMLODIPINE BESYLATE 10 MG PO TABS: 10.0000 mg | ORAL_TABLET | Freq: Every day | ORAL | 3 refills | Status: DC

## 2021-01-26 MED ORDER — IBUPROFEN 800 MG PO TABS: 800.0000 mg | ORAL_TABLET | Freq: Three times a day (TID) | ORAL | 0 refills | Status: DC

## 2021-01-26 MED ORDER — CYCLOBENZAPRINE HCL 5 MG PO TABS
5.0000 mg | ORAL_TABLET | Freq: Every evening | ORAL | 0 refills | Status: DC | PRN
Start: 2021-01-26 — End: 2021-04-25

## 2021-01-26 NOTE — Progress Notes (Signed)
Subjective:    Patient ID: Philip Cooley, male    DOB: 04/01/1965, 55 y.o.   MRN: [id number]  Chief Complaint  Patient presents with   Back Pain    Lower left, started the weekend, has gotten tighter each day. Taken ibuprofen    HPI Patient was seen today for acute concern.  Pt with L sided low back pain x 6 days.  Started over the wknd.  Does not recall injury, heavy lifting, pushing, or pulling.  Back feels like it is getting tighter.  Denies LE weakness, loss of bowel or bladder, LE pain.  Pt stretching daily 2/2 history of low back pain with sciatica.  Past Medical History:  Diagnosis Date   ACUTE GINGIVITIS NONPLAQUE INDUCED 09/07/2009   ALLERGIC RHINITIS 06/17/2008   Arthritis    hands and knees   HYPERTENSION 06/17/2008    Allergies  Allergen Reactions   Penicillins Swelling    ROS General: Denies fever, chills, night sweats, changes in weight, changes in appetite HEENT: Denies headaches, ear pain, changes in vision, rhinorrhea, sore throat CV: Denies CP, palpitations, SOB, orthopnea Pulm: Denies SOB, cough, wheezing GI: Denies abdominal pain, nausea, vomiting, diarrhea, constipation GU: Denies dysuria, hematuria, frequency Msk: Denies muscle cramps, joint pains +low back pain Neuro: Denies weakness, numbness, tingling Skin: Denies rashes, bruising Psych: Denies depression, anxiety, hallucinations     Objective:    Blood pressure 124/88, pulse 85, temperature 98.4 F (36.9 C), temperature source Oral, weight 198 lb 6.4 oz (90 kg), SpO2 99 %.   Gen. Pleasant, well-nourished, in no distress, normal affect   HEENT: [place]/AT, face symmetric, conjunctiva clear, no scleral icterus, PERRLA, EOMI, nares patent without drainage, pharynx without erythema or exudate. Neck: No JVD, no thyromegaly, no carotid bruits Lungs: no accessory muscle use, CTAB, no wheezes or rales Cardiovascular: RRR, no m/r/g, no peripheral edema Musculoskeletal: No TTP of cervical, thoracic, lumbar  spine, or paraspinal muscles.  Left lumbar paraspinal muscles tight.  no deformities, no cyanosis or clubbing, normal tone Neuro:  A&Ox3, CN II-XII intact, normal gait Skin:  Warm, no lesions/ rash   Wt Readings from Last 3 Encounters:  01/26/21 198 lb 6.4 oz (90 kg)  08/26/20 197 lb (89.4 kg)  03/14/20 199 lb 3.2 oz (90.4 kg)    Lab Results  Component Value Date   WBC 6.0 08/26/2020   HGB 17.7 (H) 08/26/2020   HCT 52.3 (H) 08/26/2020   PLT 219.0 08/26/2020   GLUCOSE 90 08/26/2020   CHOL 169 02/05/2020   TRIG 71 02/05/2020   HDL 56 02/05/2020   LDLCALC 97 02/05/2020   ALT 19 08/26/2020   AST 19 08/26/2020   NA 137 08/26/2020   K 4.0 08/26/2020   CL 102 08/26/2020   CREATININE 1.11 08/26/2020   BUN 14 08/26/2020   CO2 22 08/26/2020   TSH 1.04 08/26/2020   PSA 0.85 02/05/2020   HGBA1C 6.0 08/26/2020    Assessment/Plan:  Acute left-sided low back pain without sciatica -Discussed supportive care including stretching, heat, ice, topical analgesics, NSAIDs or Tylenol -Flexeril as needed -Advised to take ibuprofen with food not on empty stomach.  - Plan: cyclobenzaprine (FLEXERIL) 5 MG tablet, ibuprofen (ADVIL) 800 MG tablet  Essential hypertension -Controlled -Continue lifestyle modifications and current medications  - Plan: amLODipine (NORVASC) 10 MG tablet  F/u as needed  Grier Mitts, MD

## 2021-02-14 ENCOUNTER — Other Ambulatory Visit: Payer: Self-pay | Admitting: Family Medicine

## 2021-02-14 DIAGNOSIS — J302 Other seasonal allergic rhinitis: Secondary | ICD-10-CM

## 2021-02-22 ENCOUNTER — Telehealth (INDEPENDENT_AMBULATORY_CARE_PROVIDER_SITE_OTHER): Payer: Managed Care, Other (non HMO) | Admitting: Family Medicine

## 2021-02-22 ENCOUNTER — Encounter: Payer: Self-pay | Admitting: Family Medicine

## 2021-02-22 DIAGNOSIS — J40 Bronchitis, not specified as acute or chronic: Secondary | ICD-10-CM | POA: Diagnosis not present

## 2021-02-22 MED ORDER — BENZONATATE 100 MG PO CAPS: 100.0000 mg | ORAL_CAPSULE | Freq: Two times a day (BID) | ORAL | 0 refills | Status: DC | PRN

## 2021-02-22 MED ORDER — AZITHROMYCIN 250 MG PO TABS: ORAL_TABLET | ORAL | 0 refills | Status: AC

## 2021-02-22 MED ORDER — PREDNISONE 10 MG PO TABS: ORAL_TABLET | ORAL | 0 refills | Status: DC

## 2021-02-22 NOTE — Progress Notes (Signed)
Virtual Visit via Telephone Note  I connected with Philip Cooley on 02/22/21 at 11:00 AM EST by telephone and verified that I am speaking with the correct person using two identifiers.   I discussed the limitations, risks, security and privacy concerns of performing an evaluation and management service by telephone and the availability of in person appointments. I also discussed with the patient that there may be a patient responsible charge related to this service. The patient expressed understanding and agreed to proceed.  Location patient: home Location provider: work or home office Participants present for the call: patient, provider Patient did not have a visit in the prior 7 days to address this/these issue(s).   History of Present Illness: Pt is a 55 yo male with pmh sig for HTN, h/o back strain, polycythemia who was seen for ongoing concern.  Pt states his cough came back and has been more intense over the last 2 wks.  Cough mostly dry, occasionally productive with cloudy sputum.  Increased talking causes coughing.  Had wheezing a few nights ago.  Also had chills and sweats.  Pt denies fatigue, n/v, ear pain/pressure/ facial pain/pressure, sore throat.  CXR at [facility] on 01/09/2021 negative for acute process.  Pt's daughter and grandkids had colds at thanksgiving.   Observations/Objective: Patient sounds cheerful and well on the phone. I do not appreciate any SOB. Speech and thought processing are grossly intact. Patient reported vitals:  Assessment and Plan: Bronchitis  -Also consider RSV or pneumonia given recent chills but afebrile. -continue supportive care with OTC cough medications, rest, hydration, etc -given precautions -PCN allergy. -CXR from 01/09/2021 negative for acute process. -For continued or worsening symptoms obtain repeat CXR. - Plan: azithromycin (ZITHROMAX) 250 MG tablet, benzonatate (TESSALON) 100 MG capsule, predniSONE (DELTASONE) 10 MG tablet   Follow Up  Instructions: F/u  prn   [id number] 5-10 [id number] 11-20 9443 21-30 I did not refer this patient for an OV in the next 24 hours for this/these issue(s).  I discussed the assessment and treatment plan with the patient. The patient was provided an opportunity to ask questions and all were answered. The patient agreed with the plan and demonstrated an understanding of the instructions.   The patient was advised to call back or seek an in-person evaluation if the symptoms worsen or if the condition fails to improve as anticipated.  I provided 7 minutes of non-face-to-face time during this encounter.   Billie Ruddy, MD

## 2021-04-21 ENCOUNTER — Ambulatory Visit
Admission: EM | Admit: 2021-04-21 | Discharge: 2021-04-21 | Disposition: A | Discharge status: Home / Self Care | Payer: Managed Care, Other (non HMO) | Attending: Internal Medicine | Admitting: Internal Medicine

## 2021-04-21 ENCOUNTER — Ambulatory Visit (INDEPENDENT_AMBULATORY_CARE_PROVIDER_SITE_OTHER): Payer: Managed Care, Other (non HMO)

## 2021-04-21 ENCOUNTER — Other Ambulatory Visit: Payer: Self-pay

## 2021-04-21 DIAGNOSIS — R062 Wheezing: Secondary | ICD-10-CM

## 2021-04-21 DIAGNOSIS — R059 Cough, unspecified: Secondary | ICD-10-CM

## 2021-04-21 DIAGNOSIS — R053 Chronic cough: Secondary | ICD-10-CM

## 2021-04-21 MED ORDER — BENZONATATE 100 MG PO CAPS: 100.0000 mg | ORAL_CAPSULE | Freq: Three times a day (TID) | ORAL | 0 refills | Status: DC | PRN

## 2021-04-21 MED ORDER — PREDNISONE 10 MG (21) PO TBPK: ORAL_TABLET | Freq: Every day | ORAL | 0 refills | Status: DC

## 2021-04-21 MED ORDER — ALBUTEROL SULFATE HFA 108 (90 BASE) MCG/ACT IN AERS: 1.0000 | INHALATION_SPRAY | Freq: Four times a day (QID) | RESPIRATORY_TRACT | 0 refills | Status: DC | PRN
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
## 2021-04-21 NOTE — ED Triage Notes (Signed)
Over 72mo h/o cough that has worsened as of last week.  Pt notes that he's had this same cough for a few months intermittently. The cough has been previously tx with steroids with approx 1-2 wks of relief before returning.  Denies all other uri sxs.

## 2021-04-21 NOTE — Discharge Instructions (Signed)
Chest x-ray was normal. You have been prescribed another steroid Dosepak of prednisone as well as albuterol inhaler and benzonatate.  Follow-up with provided contact formation for pulmonology for further evaluation management of chronic cough.

## 2021-04-25 ENCOUNTER — Telehealth: Payer: Self-pay | Admitting: Family Medicine

## 2021-04-25 ENCOUNTER — Telehealth (INDEPENDENT_AMBULATORY_CARE_PROVIDER_SITE_OTHER): Payer: Managed Care, Other (non HMO) | Admitting: Family Medicine

## 2021-04-25 ENCOUNTER — Encounter: Payer: Self-pay | Admitting: Family Medicine

## 2021-04-25 DIAGNOSIS — R052 Subacute cough: Secondary | ICD-10-CM

## 2021-04-25 DIAGNOSIS — R062 Wheezing: Secondary | ICD-10-CM

## 2021-04-25 MED ORDER — DOXYCYCLINE HYCLATE 100 MG PO TABS: 100.0000 mg | ORAL_TABLET | Freq: Two times a day (BID) | ORAL | 0 refills | Status: DC

## 2021-04-25 NOTE — Progress Notes (Signed)
Virtual Visit via Video Note  I connected with Philip Cooley  on 04/25/21 at 10:20 AM EST by a video enabled telemedicine application and verified that I am speaking with the correct person using two identifiers.  Location patient: [place] Location provider:work or home office Persons participating in the virtual visit: patient, provider  I discussed the limitations and requested verbal permission for telemedicine visit. The patient expressed understanding and agreed to proceed.   HPI:  Acute telemedicine visit for cough an sinus issues: -Onset: about 3 weeks ago -has a cough on and off for a long time - gets a bad cough several times per year -has seen pulmonologist in the past and reports was told he had bronchitis -Symptoms include: cough, wheezing, nasal congestion, PND, ears feel full, coughing up cloudy mucus -Denies:SOB, CP, fevers, body aches, malaise, NVD -Has tried:went to urgent care a few days ago and was treated with prednisone, albuterol inhaler and tessalon -Pertinent past medical history: see below, has hx of seasonal allergies - takes zyrtec and singulair -Pertinent medication allergies: Allergies  Allergen Reactions   Penicillins Swelling  -COVID-19 vaccine status:  Immunization History  Administered Date(s) Administered   Hepatitis B 02/23/2010, 03/26/2010   Janssen (J&J) SARS-COV-2 Vaccination 07/04/2019   Moderna Sars-Covid-2 Vaccination 03/10/2020, 01/19/2021   Td 03/26/2005   Tdap 12/05/2015     ROS: See pertinent positives and negatives per HPI.  Past Medical History:  Diagnosis Date   ACUTE GINGIVITIS NONPLAQUE INDUCED 09/07/2009   ALLERGIC RHINITIS 06/17/2008   Arthritis    hands and knees   HYPERTENSION 06/17/2008    Past Surgical History:  Procedure Laterality Date   FINGER FRACTURE SURGERY     MASS EXCISION Right 08/14/2016   Procedure: EXCISIONAL BIOPSY MASS RIGHT SMALL FINGER;  Surgeon: Leanora Cover, MD;  Location: [facility];  Service:  Orthopedics;  Laterality: Right;     Current Outpatient Medications:    albuterol (VENTOLIN HFA) 108 (90 Base) MCG/ACT inhaler, Inhale 1-2 puffs into the lungs every 6 (six) hours as needed for wheezing or shortness of breath., Disp: 1 each, Rfl: 0   amLODipine (NORVASC) 10 MG tablet, Take 1 tablet (10 mg total) by mouth daily., Disp: 90 tablet, Rfl: 3   benzonatate (TESSALON) 100 MG capsule, Take 1 capsule (100 mg total) by mouth every 8 (eight) hours as needed for cough., Disp: 21 capsule, Rfl: 0   doxycycline (VIBRA-TABS) 100 MG tablet, Take 1 tablet (100 mg total) by mouth 2 (two) times daily., Disp: 14 tablet, Rfl: 0   montelukast (SINGULAIR) 10 MG tablet, TAKE 1 TABLET(10 MG) BY MOUTH AT BEDTIME, Disp: 30 tablet, Rfl: 5   Multiple Vitamin (MULTIVITAMIN WITH MINERALS) TABS, Take 1 tablet by mouth daily., Disp: , Rfl:    predniSONE (STERAPRED UNI-PAK 21 TAB) 10 MG (21) TBPK tablet, Take by mouth daily. Take 6 tabs by mouth daily  for 2 days, then 5 tabs for 2 days, then 4 tabs for 2 days, then 3 tabs for 2 days, 2 tabs for 2 days, then 1 tab by mouth daily for 2 days, Disp: 42 tablet, Rfl: 0  EXAM:  VITALS per patient if applicable:  GENERAL: alert, oriented, appears well and in no acute distress  HEENT: atraumatic, conjunttiva clear, no obvious abnormalities on inspection of external nose and ears  NECK: normal movements of the head and neck  LUNGS: on inspection no signs of respiratory distress, breathing rate appears normal, no obvious gross SOB, gasping or wheezing  CV: no obvious cyanosis  MS: moves all visible extremities without noticeable abnormality  PSYCH/NEURO: pleasant and cooperative, no obvious depression or anxiety, speech and thought processing grossly intact  ASSESSMENT AND PLAN:  Discussed the following assessment and plan:  Subacute cough  Wheezing  -we discussed possible serious and likely etiologies, options for evaluation and workup, limitations of  telemedicine visit vs in person visit, treatment, treatment risks and precautions. Pt is agreeable to treatment via telemedicine at this moment. Query viral resp illness, bronchitis vs developing 2ndary bacterial resp illness vs other. He reports he tested negative for covid. He is concerned about acute symptoms, but also he is worried about how often he has had resp illness the last year. Opted to try doxy along with completion of the prednisone, prn alb, cough medication, nasal saline, cont allergy medications. Also discussed causes of more chronic issues and advised follow up with PCP in 1 week to discuss and evaluate further.  Work/School slipped offered: provided in patient instructions  Scheduled follow up with PCP offered: Declined   Sent message to schedulers to assist and advised patient to contact PCP office to schedule if does not receive call back in next 24 hours. Advised to seek prompt virtual visit or in person care if worsening, new symptoms arise, or if is not improving with treatment as expected per our conversation of expected course. Discussed options for follow up care. Did let this patient know that I do telemedicine on Tuesdays and Thursdays for [place] and those are the days I am logged into the system. Advised to schedule follow up visit with PCP, [facility] virtual visits or [facility] if any further questions or concerns to avoid delays in care.   I discussed the assessment and treatment plan with the patient. The patient was provided an opportunity to ask questions and all were answered. The patient agreed with the plan and demonstrated an understanding of the instructions.     Lucretia Kern, DO

## 2021-04-25 NOTE — Telephone Encounter (Signed)
Patient had a virtual visit today.Philip Cooley and was informed he would receive a Dr for work. Patient has called in to see about the note for work.

## 2021-04-25 NOTE — Telephone Encounter (Signed)
Spoke with the patient and informed him of the message below.  Patient was able to view the work slip via [employer] (consulting).

## 2021-04-25 NOTE — Patient Instructions (Addendum)
° ° ° °--------------------------------------------------------------------------------------------------------------------------- ° ° ° °  WORK SLIP:  Patient Edgel Degnan,  1966-01-20, was seen for a medical visit today, 04/25/21 . Please excuse from work for today.  May return to work tomorrow if covid testing negative and no fever.   Sincerely: E-signature: Dr. Colin Benton, DO [place] Ph: [phone]   ------------------------------------------------------------------------------------------------------------------------------    -I sent the medication(s) we discussed to your pharmacy: Meds ordered this encounter  Medications   doxycycline (VIBRA-TABS) 100 MG tablet    Sig: Take 1 tablet (100 mg total) by mouth 2 (two) times daily.    Dispense:  14 tablet    Refill:  0   Continue the nasal saline and albuterol every 6 hours as needed  Consider avoidance of dairy products (milk, cheese, butter, yogurt, ice cream, etc.)  Schedule a follow up appointment with your primary care office inperson in about 1 week.  I hope you are feeling better soon!  Seek in person care promptly sooner in the interim if your symptoms worsen, new concerns arise or you are not improving with treatment.  It was nice to meet you today. I help [facility] out with telemedicine visits on Tuesdays and Thursdays and am happy to help if you need a virtual follow up visit on those days. Otherwise, if you have any concerns or questions following this visit please schedule a follow up visit with your Primary Care office or seek care at a local urgent care clinic to avoid delays in care

## 2021-04-28 ENCOUNTER — Telehealth: Payer: Self-pay | Admitting: Family Medicine

## 2021-04-28 ENCOUNTER — Other Ambulatory Visit: Payer: Self-pay | Admitting: Family Medicine

## 2021-04-28 NOTE — Telephone Encounter (Signed)
Spoke with patient informed him letter was ready for pick up and patient stated send the letter via MyChart message.

## 2021-04-28 NOTE — Telephone Encounter (Signed)
Pt called into to office to check on status of excuse. Spoke with Dr. Volanda Napoleon this has been sent via [place].

## 2021-04-28 NOTE — Telephone Encounter (Signed)
Patient is requesting a note for work for 02/02 and 02/03 due to him being out because of the coughing and wheezing.  Patient could be contacted at [phone].  Please advise.

## 2021-05-02 ENCOUNTER — Encounter: Payer: Self-pay | Admitting: Gastroenterology

## 2021-05-03 ENCOUNTER — Other Ambulatory Visit: Payer: Self-pay

## 2021-05-03 ENCOUNTER — Ambulatory Visit (INDEPENDENT_AMBULATORY_CARE_PROVIDER_SITE_OTHER): Payer: Managed Care, Other (non HMO) | Admitting: Family Medicine

## 2021-05-03 ENCOUNTER — Encounter: Payer: Self-pay | Admitting: Family Medicine

## 2021-05-03 VITALS — BP 133/88 | HR 93 | Temp 97.8°F | Resp 16 | Ht 65.0 in | Wt 200.2 lb

## 2021-05-03 DIAGNOSIS — J0140 Acute pansinusitis, unspecified: Secondary | ICD-10-CM

## 2021-05-03 DIAGNOSIS — R062 Wheezing: Secondary | ICD-10-CM | POA: Diagnosis not present

## 2021-05-03 DIAGNOSIS — J309 Allergic rhinitis, unspecified: Secondary | ICD-10-CM | POA: Diagnosis not present

## 2021-05-03 DIAGNOSIS — H6502 Acute serous otitis media, left ear: Secondary | ICD-10-CM | POA: Diagnosis not present

## 2021-05-03 MED ORDER — LEVOFLOXACIN 500 MG PO TABS: 500.0000 mg | ORAL_TABLET | Freq: Every day | ORAL | 0 refills | Status: AC

## 2021-05-03 NOTE — Progress Notes (Signed)
Subjective:    Patient ID: Philip Cooley, male    DOB: 1966/03/26, 56 y.o.   MRN: [id number]  Chief Complaint  Patient presents with   Follow-up    Wheezing still but cough is now breaking up.     HPI Patient was seen today for follow-up on acute concern.  Patient was seen in the ED on 04/21/2021 and virtually on 04/25/2021 for ongoing cough and congestion with wheezing.  Finished Doxycycline today.  Pt notes continued nasal congestion.  Started having sinus pressure, dental pain, ears feeling clogged L>R x 2 days.  Patient using nasal lavage twice a day.  Does not want to use nasal spray as in the past had to wean himself off of when he was taking.  States wheezing has improved, using albuterol inhaler at night.  Denies SOB.  Taking Singulair.  Was taking OTC allergy medicine but ran out recently.  Past Medical History:  Diagnosis Date   ACUTE GINGIVITIS NONPLAQUE INDUCED 09/07/2009   ALLERGIC RHINITIS 06/17/2008   Arthritis    hands and knees   HYPERTENSION 06/17/2008    Allergies  Allergen Reactions   Penicillins Swelling    ROS General: Denies fever, chills, night sweats, changes in weight, changes in appetite HEENT: Denies headaches, ear pain, changes in vision, rhinorrhea, sore throat  + clogged ears, facial/dental pain and pressure CV: Denies CP, palpitations, SOB, orthopnea Pulm: Denies SOB +cough, wheezing GI: Denies abdominal pain, nausea, vomiting, diarrhea, constipation GU: Denies dysuria, hematuria, frequency Msk: Denies muscle cramps, joint pains Neuro: Denies weakness, numbness, tingling Skin: Denies rashes, bruising Psych: Denies depression, anxiety, hallucinations    Objective:    Blood pressure 133/88, pulse 93, temperature 97.8 F (36.6 C), resp. rate 16, height 5\' 5"  (1.651 m), weight 200 lb 3.2 oz (90.8 kg), SpO2 96 %.  Gen. Pleasant, well-nourished, in no distress, normal affect   HEENT: [place]/AT, face symmetric, conjunctiva clear, no scleral icterus, PERRLA,  EOMI, nares patent without drainage, pharynx without erythema or exudate.  Normal external left ear and canal.  Left TM full with suppurative fluid and erythematous.  Right external ear and canal normal.  Right TM full.  TTP of maxillary and frontal sinuses. Lungs: no accessory muscle use, faint wheezing in bases and transmitted upper airway noises.  No rales Cardiovascular: RRR, no m/r/g, no peripheral edema Musculoskeletal: No deformities, no cyanosis or clubbing, normal tone Neuro:  A&Ox3, CN II-XII intact, normal gait Skin:  Warm, no lesions/ rash   Wt Readings from Last 3 Encounters:  05/03/21 200 lb 3.2 oz (90.8 kg)  01/26/21 198 lb 6.4 oz (90 kg)  08/26/20 197 lb (89.4 kg)    Lab Results  Component Value Date   WBC 6.0 08/26/2020   HGB 17.7 (H) 08/26/2020   HCT 52.3 (H) 08/26/2020   PLT 219.0 08/26/2020   GLUCOSE 90 08/26/2020   CHOL 169 02/05/2020   TRIG 71 02/05/2020   HDL 56 02/05/2020   LDLCALC 97 02/05/2020   ALT 19 08/26/2020   AST 19 08/26/2020   NA 137 08/26/2020   K 4.0 08/26/2020   CL 102 08/26/2020   CREATININE 1.11 08/26/2020   BUN 14 08/26/2020   CO2 22 08/26/2020   TSH 1.04 08/26/2020   PSA 0.85 02/05/2020   HGBA1C 6.0 08/26/2020    Assessment/Plan:  Acute serous otitis media of left ear, recurrence not specified  -New problem -Patient just completed course of doxycycline and has severe allergy to penicillin will start Levaquin. -OTC  Tylenol for any pain/discomfort. -Discussed using OTC Flonase, however patient declines at this time is hesitant to be on another nasal spray.  Sounds like patient was on Afrin in the past which can be difficult to wean off of.  Attempted to explain Flonase was not addictive however declines to start. - Plan: Ambulatory referral to ENT, levofloxacin (LEVAQUIN) 500 MG tablet  Acute pansinusitis, recurrence not specified -Given recent doxycycline use and severe allergy to penicillin we will start Levaquin 500 mg  daily -Continue nasal lavage -Advised to consider Flonase nasal spray.  Declines at this time. -Plan: Ambulatory referral to ENT, levofloxacin (LEVAQUIN) 500 MG tablet  Wheezing -Likely worsen by nasal drainage due to allergies -Continue allergy medications -Continue albuterol inhaler as needed - Plan: Ambulatory referral to ENT  Allergic rhinitis, unspecified seasonality, unspecified trigger  -Continue current allergy medication such as Singulair.  Patient to pick up OTC antihistamine.  Advised antihistamine with decongestant may cause elevation in BP. - Plan: Ambulatory referral to ENT  F/u prn  Grier Mitts, MD

## 2021-05-17 ENCOUNTER — Encounter: Payer: Self-pay | Admitting: Gastroenterology

## 2021-05-22 ENCOUNTER — Ambulatory Visit: Payer: Managed Care, Other (non HMO) | Admitting: Family Medicine

## 2021-05-22 VITALS — BP 138/71 | HR 83 | Temp 98.3°F | Wt 199.4 lb

## 2021-05-22 DIAGNOSIS — Z9109 Other allergy status, other than to drugs and biological substances: Secondary | ICD-10-CM | POA: Diagnosis not present

## 2021-05-22 DIAGNOSIS — R053 Chronic cough: Secondary | ICD-10-CM

## 2021-05-22 DIAGNOSIS — T148XXA Other injury of unspecified body region, initial encounter: Secondary | ICD-10-CM | POA: Diagnosis not present

## 2021-05-22 DIAGNOSIS — K649 Unspecified hemorrhoids: Secondary | ICD-10-CM

## 2021-05-22 MED ORDER — HYDROCORTISONE ACETATE 25 MG RE SUPP: 25.0000 mg | Freq: Two times a day (BID) | RECTAL | 0 refills | Status: AC

## 2021-05-22 MED ORDER — FLUTICASONE PROPIONATE 50 MCG/ACT NA SUSP: 1.0000 | Freq: Every day | NASAL | 6 refills | Status: DC

## 2021-05-22 NOTE — Patient Instructions (Signed)
Call the ENT's office at [phone] to set up your appointment with Dr. Melida Quitter.

## 2021-05-22 NOTE — Progress Notes (Signed)
Subjective:    Patient ID: Philip Cooley, male    DOB: 05/31/1965, 56 y.o.   MRN: [id number]  Chief Complaint  Patient presents with   Follow-up    Left ear. Not making noise when blows his nose. Cough is back.  Has cramp in left side.     HPI Patient was seen today for follow-up on cough and allergic rhinitis.  Patient seen in ED, virtually, and in clinic off and on for the last month for similar symptoms.  Completed a course of doxycycline and course of Levaquin.  Has continued Singulair and Claritin.  Eyes are less watery since starting Claritin.  Patient has yet to hear about ENT referral.  Cough returned several days after completing antibiotics.  Worse in AM and in the evening.  Intermittent dry coughing throughout the day.  Patient using nasal lavage.  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  No longer wearing a mask at work.  Denies fever, chills, wheezing, sore throat, ear pain/pressure, facial pain/pressure.  Patient concerned increased coughing has caused pruritic hemorrhoids.  Having regular bowel movements daily without having to strain.  Typically sits on toilet for a while.  Used OTC products without relief.  Has colonoscopy scheduled next month.  Denies bleeding or pain.  Patient notes intermittent pain in left flank/abdomen with certain movements.  Pain does not occur with coughing.  Past Medical History:  Diagnosis Date   ACUTE GINGIVITIS NONPLAQUE INDUCED 09/07/2009   ALLERGIC RHINITIS 06/17/2008   Arthritis    hands and knees   HYPERTENSION 06/17/2008    Allergies  Allergen Reactions   Penicillins Swelling    ROS General: Denies fever, chills, night sweats, changes in weight, changes in appetite HEENT: Denies headaches, ear pain, changes in vision, rhinorrhea, sore throat CV: Denies CP, palpitations, SOB, orthopnea Pulm: Denies SOB, wheezing   +cough GI: Denies abdominal pain, nausea, vomiting, diarrhea, constipation GU: Denies dysuria,  hematuria, frequency  + hemorrhoids Msk: Denies muscle cramps, joint pains + left lateral abdominal/flank pain Neuro: Denies weakness, numbness, tingling Skin: Denies rashes, bruising Psych: Denies depression, anxiety, hallucinations    Objective:    Blood pressure 138/71, pulse 83, temperature 98.3 F (36.8 C), temperature source Oral, weight 199 lb 6.4 oz (90.4 kg), SpO2 98 %.  Gen. Pleasant, well-nourished, in no distress, normal affect   HEENT: [place]/AT, face symmetric, conjunctiva clear, no scleral icterus, PERRLA, EOMI, nares patent without drainage, pharynx without erythema or exudate.  TMs normal bilaterally. Lungs: no accessory muscle use, CTAB, no wheezes or rales Cardiovascular: RRR, no m/r/g, no peripheral edema Abdomen: BS present, soft, NT/ND, no hepatosplenomegaly. Musculoskeletal: No deformities, no cyanosis or clubbing, normal tone Neuro:  A&Ox3, CN II-XII intact, normal gait Skin:  Warm, no lesions/ rash   Wt Readings from Last 3 Encounters:  05/22/21 199 lb 6.4 oz (90.4 kg)  05/03/21 200 lb 3.2 oz (90.8 kg)  01/26/21 198 lb 6.4 oz (90 kg)    Lab Results  Component Value Date   WBC 6.0 08/26/2020   HGB 17.7 (H) 08/26/2020   HCT 52.3 (H) 08/26/2020   PLT 219.0 08/26/2020   GLUCOSE 90 08/26/2020   CHOL 169 02/05/2020   TRIG 71 02/05/2020   HDL 56 02/05/2020   LDLCALC 97 02/05/2020   ALT 19 08/26/2020   AST 19 08/26/2020   NA 137 08/26/2020   K 4.0 08/26/2020   CL 102 08/26/2020   CREATININE 1.11 08/26/2020   BUN 14 08/26/2020  CO2 22 08/26/2020   TSH 1.04 08/26/2020   PSA 0.85 02/05/2020   HGBA1C 6.0 08/26/2020    Assessment/Plan:  Chronic cough  -Discussed possible causes including increased nasal drainage from environmental allergies, also consider GERD.  Not currently on medications known to cause cough. -Continue OTC Claritin and Singulair -Discussed starting Flonase for better control of allergy symptoms. -ENT referral placed during previous  office visit.  Patient encouraged to contact ENT office to schedule an appointment.  Given information. - Plan: fluticasone (FLONASE) 50 MCG/ACT nasal spray  Hemorrhoids, unspecified hemorrhoid type -New problem -Colonoscopy scheduled next month.  Encouraged to keep follow-up with GI - Plan: hydrocortisone (ANUSOL-HC) 25 MG suppository  Environmental allergies  -Discussed wearing mask or some facial protection such as a gator at work to help reduce dirt and debris in the ears. -Continue saline nasal rinse/nasal lavage, OTC Claritin. -Discussed starting Flonase nasal spray -Continue Singulair - Plan: fluticasone (FLONASE) 50 MCG/ACT nasal spray  Muscle strain -New problem -Left flank/lateral abdominal pain likely 2/2 muscle strain given symptoms occur with movement into certain positions. -Supportive care with heat, topical analgesics, NSAIDs, or Tylenol as needed -Discussed proper technique for lifting -Continue to monitor  F/u as needed  On day of service, 34 minutes spent caring for this patient face-to-face, reviewing the chart, counseling and/or coordinating care for plan and treatment of diagnosis below.     Grier Mitts, MD

## 2021-05-24 ENCOUNTER — Encounter: Payer: Self-pay | Admitting: Family Medicine

## 2021-06-07 ENCOUNTER — Other Ambulatory Visit: Payer: Self-pay

## 2021-06-07 ENCOUNTER — Ambulatory Visit (AMBULATORY_SURGERY_CENTER): Payer: Managed Care, Other (non HMO)

## 2021-06-07 VITALS — Ht 65.0 in | Wt 197.0 lb

## 2021-06-07 DIAGNOSIS — Z86010 Personal history of colonic polyps: Secondary | ICD-10-CM

## 2021-06-07 MED ORDER — NA SULFATE-K SULFATE-MG SULF 17.5-3.13-1.6 GM/177ML PO SOLN: 1.0000 | Freq: Once | ORAL | 0 refills | Status: AC

## 2021-06-07 NOTE — Progress Notes (Signed)
No egg or soy allergy known to patient  ?No issues known to pt with past sedation with any surgeries or procedures ?Patient denies ever being told they had issues or difficulty with intubation  ?No FH of Malignant Hyperthermia ?Pt is not on diet pills ?Pt is not on  home 02  ?Pt is not on blood thinners  ?Pt denies issues with constipation;  ?No A fib or A flutter ?Pt is fully vaccinated for Covid x 2; ?Advised patient to use SingleCare for RX discount=information also sent in RX= ?NO PA's for preps discussed with pt in PV today  ?Discussed with pt there will be an out-of-pocket cost for prep and that varies from $0 to 70 + dollars - pt verbalized understanding  ?Due to the COVID-19 pandemic we are asking patients to follow certain guidelines in PV and the [place]   ?Pt aware of COVID protocols and [facility] guidelines  ?PV completed over the phone. Pt verified name, DOB, address and insurance during PV today.  ?Pt mailed instruction packet with copy of consent form to read and not return, and instructions.  ?Pt encouraged to call with questions or issues.  ?If pt has My chart, procedure instructions sent via My Chart  ? ?

## 2021-06-09 ENCOUNTER — Encounter: Payer: Self-pay | Admitting: Gastroenterology

## 2021-06-21 ENCOUNTER — Encounter: Payer: Self-pay | Admitting: Gastroenterology

## 2021-06-21 ENCOUNTER — Ambulatory Visit (AMBULATORY_SURGERY_CENTER): Payer: Managed Care, Other (non HMO) | Admitting: Gastroenterology

## 2021-06-21 VITALS — BP 112/81 | HR 73 | Temp 99.3°F | Resp 18 | Ht 65.0 in | Wt 197.0 lb

## 2021-06-21 DIAGNOSIS — Z86010 Personal history of colonic polyps: Secondary | ICD-10-CM | POA: Diagnosis present

## 2021-06-21 DIAGNOSIS — D123 Benign neoplasm of transverse colon: Secondary | ICD-10-CM | POA: Diagnosis not present

## 2021-06-21 DIAGNOSIS — D120 Benign neoplasm of cecum: Secondary | ICD-10-CM

## 2021-06-21 MED ORDER — SODIUM CHLORIDE 0.9 % IV SOLN
500.0000 mL | Freq: Once | INTRAVENOUS | Status: DC
Start: 2021-06-21 — End: 2021-06-21

## 2021-06-21 NOTE — Patient Instructions (Signed)
Handouts on polyps and hemorrhoids given. ? ?Await pathology result. ? ?YOU HAD AN ENDOSCOPIC PROCEDURE TODAY AT [facility]:   Refer to the procedure report that was given to you for any specific questions about what was found during the examination.  If the procedure report does not answer your questions, please call your gastroenterologist to clarify.  If you requested that your care partner not be given the details of your procedure findings, then the procedure report has been included in a sealed envelope for you to review at your convenience later. ? ?YOU SHOULD EXPECT: Some feelings of bloating in the abdomen. Passage of more gas than usual.  Walking can help get rid of the air that was put into your GI tract during the procedure and reduce the bloating. If you had a lower endoscopy (such as a colonoscopy or flexible sigmoidoscopy) you may notice spotting of blood in your stool or on the toilet paper. If you underwent a bowel prep for your procedure, you may not have a normal bowel movement for a few days. ? ?Please Note:  You might notice some irritation and congestion in your nose or some drainage.  This is from the oxygen used during your procedure.  There is no need for concern and it should clear up in a day or so. ? ?SYMPTOMS TO REPORT IMMEDIATELY: ? ?Following lower endoscopy (colonoscopy or flexible sigmoidoscopy): ? Excessive amounts of blood in the stool ? Significant tenderness or worsening of abdominal pains ? Swelling of the abdomen that is new, acute ? Fever of 100?F or higher ? ? ?For urgent or emergent issues, a gastroenterologist can be reached at any hour by calling [phone]. ?Do not use MyChart messaging for urgent concerns.  ? ? ?DIET:  We do recommend a small meal at first, but then you may proceed to your regular diet.  Drink plenty of fluids but you should avoid alcoholic beverages for 24 hours. ? ?ACTIVITY:  You should plan to take it easy for the rest of today  and you should NOT DRIVE or use heavy machinery until tomorrow (because of the sedation medicines used during the test).   ? ?FOLLOW UP: ?Our staff will call the number listed on your records 48-72 hours following your procedure to check on you and address any questions or concerns that you may have regarding the information given to you following your procedure. If we do not reach you, we will leave a message.  We will attempt to reach you two times.  During this call, we will ask if you have developed any symptoms of COVID 19. If you develop any symptoms (ie: fever, flu-like symptoms, shortness of breath, cough etc.) before then, please call [phone].  If you test positive for Covid 19 in the 2 weeks post procedure, please call and report this information to [place].   ? ?If any biopsies were taken you will be contacted by phone or by letter within the next 1-3 weeks.  Please call us at [phone] if you have not heard about the biopsies in 3 weeks.  ? ? ?SIGNATURES/CONFIDENTIALITY: ?You and/or your care partner have signed paperwork which will be entered into your electronic medical record.  These signatures attest to the fact that that the information above on your After Visit Summary has been reviewed and is understood.  Full responsibility of the confidentiality of this discharge information lies with you and/or your care-partner.  ?

## 2021-06-21 NOTE — Progress Notes (Signed)
PT taken to PACU. Monitors in place. VSS. Report given to RN. 

## 2021-06-21 NOTE — Progress Notes (Signed)
Pt's states no medical or surgical changes since previsit or office visit. VS by DT. °

## 2021-06-21 NOTE — Op Note (Signed)
[place] ?Patient Name: Philip Cooley ?Procedure Date: 06/21/2021 2:24 PM ?MRN: [id number] ?Endoscopist: Ladene Artist , MD ?Age: 56 ?Referring MD:  ?Date of Birth: [birth year] ?Gender: Male ?Account #: [id number] ?Procedure:                Colonoscopy ?Indications:              Surveillance: Personal history of adenomatous  ?                          polyps on last colonoscopy > 5 years ago ?Medicines:                Monitored Anesthesia Care ?Procedure:                Pre-Anesthesia Assessment: ?                          - Prior to the procedure, a History and Physical  ?                          was performed, and patient medications and  ?                          allergies were reviewed. The patient's tolerance of  ?                          previous anesthesia was also reviewed. The risks  ?                          and benefits of the procedure and the sedation  ?                          options and risks were discussed with the patient.  ?                          All questions were answered, and informed consent  ?                          was obtained. Prior Anticoagulants: The patient has  ?                          taken no previous anticoagulant or antiplatelet  ?                          agents. ASA Grade Assessment: II - A patient with  ?                          mild systemic disease. After reviewing the risks  ?                          and benefits, the patient was deemed in  ?                          satisfactory condition to undergo the procedure. ?  After obtaining informed consent, the colonoscope  ?                          was passed under direct vision. Throughout the  ?                          procedure, the patient's blood pressure, pulse, and  ?                          oxygen saturations were monitored continuously. The  ?                          Olympus CF-[id number] (#[id number]) Colonoscope was  ?                          introduced through the anus  and advanced to the the  ?                          cecum, identified by appendiceal orifice and  ?                          ileocecal valve. The ileocecal valve, appendiceal  ?                          orifice, and rectum were photographed. The quality  ?                          of the bowel preparation was good. The colonoscopy  ?                          was performed without difficulty. The patient  ?                          tolerated the procedure well. ?Scope In: 3:49:07 PM ?Scope Out: 4:04:41 PM ?Scope Withdrawal Time: 0 hours 12 minutes 56 seconds  ?Total Procedure Duration: 0 hours 15 minutes 34 seconds  ?Findings:                 The perianal and digital rectal examinations were  ?                          normal. ?                          Two sessile polyps were found in the transverse  ?                          colon and cecum. The polyps were 7 mm in size.  ?                          These polyps were removed with a cold snare.  ?                          Resection and retrieval were complete. ?  Internal hemorrhoids were found during  ?                          retroflexion. The hemorrhoids were small and Grade  ?                          I (internal hemorrhoids that do not prolapse). ?                          The exam was otherwise without abnormality on  ?                          direct and retroflexion views. ?Complications:            No immediate complications. Estimated blood loss:  ?                          None. ?Estimated Blood Loss:     Estimated blood loss: none. ?Impression:               - Two 7 mm polyps in the transverse colon and in  ?                          the cecum, removed with a cold snare. Resected and  ?                          retrieved. ?                          - Internal hemorrhoids. ?                          - The examination was otherwise normal on direct  ?                          and retroflexion views. ?Recommendation:           -  Repeat colonoscopy after studies are complete for  ?                          surveillance based on pathology results. ?                          - Patient has a contact number available for  ?                          emergencies. The signs and symptoms of potential  ?                          delayed complications were discussed with the  ?                          patient. Return to normal activities tomorrow.  ?                          Written discharge instructions were provided to the  ?  patient. ?                          - Resume previous diet. ?                          - Continue present medications. ?                          - Await pathology results. ?Ladene Artist, MD ?06/21/2021 4:07:38 PM ?This report has been signed electronically. ?

## 2021-06-21 NOTE — Progress Notes (Signed)
Called to room to assist during endoscopic procedure.  Patient ID and intended procedure confirmed with present staff. Received instructions for my participation in the procedure from the performing physician.  

## 2021-06-21 NOTE — Progress Notes (Signed)
? ?History & Physical ? ?Primary Care Physician:  Billie Ruddy, MD ?Primary Gastroenterologist: Lucio Edward, MD ? ?CHIEF COMPLAINT:  Personal history of colon polyps  ? ?HPI: Philip Cooley is a 56 y.o. male with a personal history of adenomatous colon polyps for surveillance colonoscopy. ? ? ?Past Medical History:  ?Diagnosis Date  ? ACUTE GINGIVITIS NONPLAQUE INDUCED 09/07/2009  ? ALLERGIC RHINITIS 06/17/2008  ? Arthritis   ? hands and knees  ? HYPERTENSION 06/17/2008  ? on meds  ? Seasonal allergies   ? ? ?Past Surgical History:  ?Procedure Laterality Date  ? COLONOSCOPY  2017  ? MS-MAC-suprep(good0-TA polyp  ? FINGER FRACTURE SURGERY Right 1997  ? middle finger  ? MASS EXCISION Right 08/14/2016  ? Procedure: EXCISIONAL BIOPSY MASS RIGHT SMALL FINGER;  Surgeon: Leanora Cover, MD;  Location: [facility];  Service: Orthopedics;  Laterality: Right;  ? POLYPECTOMY  2017  ? TA  ? WISDOM TOOTH EXTRACTION    ? ? ?Prior to Admission medications   ?Medication Sig Start Date End Date Taking? Authorizing Provider  ?amLODipine (NORVASC) 10 MG tablet Take 1 tablet (10 mg total) by mouth daily. 01/26/21  Yes Billie Ruddy, MD  ?fluticasone (FLONASE) 50 MCG/ACT nasal spray Place 1 spray into both nostrils daily. 05/22/21  Yes Billie Ruddy, MD  ?loratadine (CLARITIN) 10 MG tablet Take 10 mg by mouth daily.   Yes [provider]  ?montelukast (SINGULAIR) 10 MG tablet TAKE 1 TABLET(10 MG) BY MOUTH AT BEDTIME 02/15/21  Yes Billie Ruddy, MD  ?Multiple Vitamin (MULTIVITAMIN WITH MINERALS) TABS Take 1 tablet by mouth daily.   Yes [provider]  ?hydrocortisone (ANUSOL-HC) 25 MG suppository Place 1 suppository (25 mg total) rectally 2 (two) times daily. 05/22/21   Billie Ruddy, MD  ? ? ?Current Outpatient Medications  ?Medication Sig Dispense Refill  ? amLODipine (NORVASC) 10 MG tablet Take 1 tablet (10 mg total) by mouth daily. 90 tablet 3  ? fluticasone (FLONASE) 50 MCG/ACT nasal  spray Place 1 spray into both nostrils daily. 16 g 6  ? loratadine (CLARITIN) 10 MG tablet Take 10 mg by mouth daily.    ? montelukast (SINGULAIR) 10 MG tablet TAKE 1 TABLET(10 MG) BY MOUTH AT BEDTIME 30 tablet 5  ? Multiple Vitamin (MULTIVITAMIN WITH MINERALS) TABS Take 1 tablet by mouth daily.    ? hydrocortisone (ANUSOL-HC) 25 MG suppository Place 1 suppository (25 mg total) rectally 2 (two) times daily. 12 suppository 0  ? ?Current Facility-Administered Medications  ?Medication Dose Route Frequency Provider Last Rate Last Admin  ? 0.9 %  sodium chloride infusion  500 mL Intravenous Once Ladene Artist, MD      ? ? ?Allergies as of 06/21/2021 - Review Complete 06/21/2021  ?Allergen Reaction Noted  ? Penicillins Swelling   ? ? ?Family History  ?Problem Relation Age of Onset  ? Diabetes Mother   ? Cancer Mother   ? Colon cancer Neg Hx   ? Colon polyps Neg Hx   ? Esophageal cancer Neg Hx   ? Rectal cancer Neg Hx   ? Stomach cancer Neg Hx   ? ? ?Social History  ? ?Socioeconomic History  ? Marital status: Married  ?  Spouse name: Not on file  ? Number of children: Not on file  ? Years of education: Not on file  ? Highest education level: GED or equivalent  ?Occupational History  ? Not on file  ?Tobacco Use  ?  Smoking status: Never  ? Smokeless tobacco: Never  ?Vaping Use  ? Vaping Use: Never used  ?Substance and Sexual Activity  ? Alcohol use: No  ? Drug use: No  ? Sexual activity: Not on file  ?Other Topics Concern  ? Not on file  ?Social History Narrative  ? Not on file  ? ?Social Determinants of Health  ? ?Financial Resource Strain: Medium Risk  ? Difficulty of Paying Living Expenses: Somewhat hard  ?Food Insecurity: No Food Insecurity  ? Worried About Charity fundraiser in the Last Year: Never true  ? Ran Out of Food in the Last Year: Never true  ?Transportation Needs: No Transportation Needs  ? Lack of Transportation (Medical): No  ? Lack of Transportation (Non-Medical): No  ?Physical Activity: Unknown  ? Days  of Exercise per Week: 0 days  ? Minutes of Exercise per Session: Not on file  ?Stress: No Stress Concern Present  ? Feeling of Stress : Not at all  ?Social Connections: Socially Isolated  ? Frequency of Communication with Friends and Family: More than three times a week  ? Frequency of Social Gatherings with Friends and Family: Once a week  ? Attends Religious Services: Never  ? Active Member of Clubs or Organizations: No  ? Attends Archivist Meetings: Not on file  ? Marital Status: Separated  ?Intimate Partner Violence: Not on file  ? ? ?Review of Systems: ? ?All systems reviewed an negative except where noted in HPI. ? ?Gen: Denies any fever, chills, sweats, anorexia, fatigue, weakness, malaise, weight loss, and sleep disorder ?CV: Denies chest pain, angina, palpitations, syncope, orthopnea, PND, peripheral edema, and claudication. ?Resp: Denies dyspnea at rest, dyspnea with exercise, cough, sputum, wheezing, coughing up blood, and pleurisy. ?GI: Denies vomiting blood, jaundice, and fecal incontinence.   Denies dysphagia or odynophagia. ?GU : Denies urinary burning, blood in urine, urinary frequency, urinary hesitancy, nocturnal urination, and urinary incontinence. ?MS: Denies joint pain, limitation of movement, and swelling, stiffness, low back pain, extremity pain. Denies muscle weakness, cramps, atrophy.  ?Derm: Denies rash, itching, dry skin, hives, moles, warts, or unhealing ulcers.  ?Psych: Denies depression, anxiety, memory loss, suicidal ideation, hallucinations, paranoia, and confusion. ?Heme: Denies bruising, bleeding, and enlarged lymph nodes. ?Neuro:  Denies any headaches, dizziness, paresthesias. ?Endo:  Denies any problems with DM, thyroid, adrenal function. ? ? ?Physical Exam: ?General:  Alert, well-developed, in NAD ?Head:  Normocephalic and atraumatic. ?Eyes:  Sclera clear, no icterus.   Conjunctiva pink. ?Ears:  Normal auditory acuity. ?Mouth:  No deformity or lesions.  ?Neck:  Supple;  no masses . ?Lungs:  Clear throughout to auscultation.   No wheezes, crackles, or rhonchi. No acute distress. ?Heart:  Regular rate and rhythm; no murmurs. ?Abdomen:  Soft, nondistended, nontender. No masses, hepatomegaly. No obvious masses.  Normal bowel .    ?Rectal:  Deferred   ?Msk:  Symmetrical without gross deformities.Marland Kitchen ?Pulses:  Normal pulses noted. ?Extremities:  Without edema. ?Neurologic:  Alert and  oriented x4;  grossly normal neurologically. ?Skin:  Intact without significant lesions or rashes. ?Cervical Nodes:  No significant cervical adenopathy. ?Psych:  Alert and cooperative. Normal mood and affect. ? ? ?Impression / Plan:  ? ?Personal history of adenomatous colon polyps for surveillance colonoscopy. ? ?Srihaan Mastrangelo T. Fuller Plan  06/21/2021, 3:43 PM ?See Shea Evans, [facility], to contact our on call provider ? ? ?  ?

## 2021-06-23 ENCOUNTER — Telehealth: Payer: Self-pay | Admitting: *Deleted

## 2021-06-23 NOTE — Telephone Encounter (Signed)
?  Follow up Call- ? ? ?  06/21/2021  ?  3:23 PM  ?Call back number  ?Post procedure Call Back phone  # [phone]  ?Permission to leave phone message Yes  ?  ? ?Patient questions: ? ?Do you have a fever, pain , or abdominal swelling? No. ?Pain Score  0 * ? ?Have you tolerated food without any problems? Yes.   ? ?Have you been able to return to your normal activities? Yes.   ? ?Do you have any questions about your discharge instructions: ?Diet   No. ?Medications  No. ?Follow up visit  No. ? ?Do you have questions or concerns about your Care? No. ? ?Actions: ?* If pain score is 4 or above: ?No action needed, pain <4. ? ? ?

## 2021-06-27 DIAGNOSIS — R053 Chronic cough: Secondary | ICD-10-CM | POA: Insufficient documentation

## 2021-07-12 ENCOUNTER — Telehealth: Payer: Self-pay | Admitting: Gastroenterology

## 2021-07-12 NOTE — Telephone Encounter (Signed)
Is very sorry he missed your call. Can be reached at [phone]. ?

## 2021-07-12 NOTE — Telephone Encounter (Signed)
I did not send a path letter as I wanted to review the pathology with him by phone. I called him earlier today and left a VM. I called him again just now and left a VM. Once we review the path results he will need a referral to Dr. 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  ?

## 2021-07-12 NOTE — Telephone Encounter (Signed)
Dr Fuller Plan have you review the path results from 3/29? I do not see a notation or letter in [place]? ?

## 2021-07-12 NOTE — Telephone Encounter (Signed)
Patient would like to speak to nurse or Dr Fuller Plan to discuss colon results ?

## 2021-07-12 NOTE — Telephone Encounter (Signed)
See path report result note.  ?

## 2021-07-13 NOTE — Telephone Encounter (Signed)
Ladene Artist, MD  Beatris Si, RN ?Cc: Timothy Lasso, RN ?Kaycee Haycraft,  ?I reviewed the pathology report with the patient by phone and addressed his questions to a limited level given his rare diagnosis.  ?Please schedule an REV to Dr. Lindi Adie or if the [facility] deems appropriate for Langerhan's cell histiocytosis referral to Dr. Irene Limbo for evaluation, mgmt.  ?Colonoscopy recall in 05/2028 for 1 tubular adenoma  ?Path letter not needed  ?

## 2021-07-18 ENCOUNTER — Other Ambulatory Visit: Payer: Self-pay

## 2021-07-18 ENCOUNTER — Telehealth: Payer: Self-pay | Admitting: Hematology and Oncology

## 2021-07-18 DIAGNOSIS — C966 Unifocal Langerhans-cell histiocytosis: Secondary | ICD-10-CM

## 2021-07-18 NOTE — Telephone Encounter (Signed)
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 Patient has been notified.  ?

## 2021-07-18 NOTE — Telephone Encounter (Signed)
Scheduled appt per 4/24 referral. Pt is aware of appt date and time. Pt is aware to arrive 15 mins prior to appt time and to bring and updated insurance card. Pt is aware of appt location.   ?

## 2021-07-19 ENCOUNTER — Other Ambulatory Visit: Payer: Self-pay | Admitting: Family Medicine

## 2021-07-19 DIAGNOSIS — J302 Other seasonal allergic rhinitis: Secondary | ICD-10-CM

## 2021-07-24 ENCOUNTER — Other Ambulatory Visit: Payer: Self-pay

## 2021-07-24 ENCOUNTER — Inpatient Hospital Stay: Payer: Managed Care, Other (non HMO) | Attending: Hematology and Oncology | Admitting: Hematology and Oncology

## 2021-07-24 VITALS — BP 138/96 | HR 89 | Temp 98.2°F | Resp 18 | Ht 65.0 in | Wt 198.1 lb

## 2021-07-24 DIAGNOSIS — D751 Secondary polycythemia: Secondary | ICD-10-CM

## 2021-07-24 DIAGNOSIS — C966 Unifocal Langerhans-cell histiocytosis: Secondary | ICD-10-CM | POA: Diagnosis present

## 2021-07-24 NOTE — Progress Notes (Signed)
? ?Patient Care Team: ?Billie Ruddy, MD as PCP - General (Family Medicine) ? ?DIAGNOSIS:  ?Encounter Diagnoses  ?Name Primary?  ? Polycythemia   ? 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 ? ?SUMMARY OF ONCOLOGIC HISTORY: ?Oncology History  ? No history exists.  ? ? ?CHIEF COMPLIANT:  LANGERHANS' CELL HISTIOCYTOSIS ? ?INTERVAL HISTORY: Philip Cooley is a  56 y.o. male with above-mentioned LANGERHANS' CELL HISTIOCYTOSIS. He presents to the clinic for a follow-up.  He had a routine colonoscopy that detected colon polyp which on biopsy came back as Langerhans' cell histiocytosis.  He has no symptoms no cutaneous lesions or any bone pain. ? ? ?ALLERGIES:  is allergic to penicillins. ? ?MEDICATIONS:  ?Current Outpatient Medications  ?Medication Sig Dispense Refill  ? amLODipine (NORVASC) 10 MG tablet Take 1 tablet (10 mg total) by mouth daily. 90 tablet 3  ? fluticasone (FLONASE) 50 MCG/ACT nasal spray Place 1 spray into both nostrils daily. 16 g 6  ? hydrocortisone (ANUSOL-HC) 25 MG suppository Place 1 suppository (25 mg total) rectally 2 (two) times daily. 12 suppository 0  ? loratadine (CLARITIN) 10 MG tablet Take 10 mg by mouth daily.    ? montelukast (SINGULAIR) 10 MG tablet TAKE 1 TABLET(10 MG) BY MOUTH AT BEDTIME 30 tablet 5  ? Multiple Vitamin (MULTIVITAMIN WITH MINERALS) TABS Take 1 tablet by mouth daily.    ? ?No current facility-administered medications for this visit.  ? ? ?PHYSICAL EXAMINATION: ?ECOG PERFORMANCE STATUS: 0 - Asymptomatic ? ?Vitals:  ? 07/24/21 0830  ?BP: (!) 138/96  ?Pulse: 89  ?Resp: 18  ?Temp: 98.2 ?F (36.8 ?C)  ?SpO2: 100%  ? ?Filed Weights  ? 07/24/21 0830  ?Weight: 198 lb 1.6 oz (89.9 kg)  ? ?  ? ?LABORATORY DATA:  ?I have reviewed the data as listed ? ?  Latest Ref Rng & Units 08/26/2020  ? 12:03 PM 02/05/2020  ?  3:57 PM 11/12/2017  ?  5:11 PM  ?CMP  ?Glucose 70 - 99 mg/dL 90   89   93    ?BUN 6 - 23 mg/dL 14   11   13     ?Creatinine 0.40 - 1.50 mg/dL 1.11   1.15   1.29    ?Sodium  135 - 145 mEq/L 137   139   138    ?Potassium 3.5 - 5.1 mEq/L 4.0   4.0   4.1    ?Chloride 96 - 112 mEq/L 102   102   100    ?CO2 19 - 32 mEq/L 22   25   28     ?Calcium 8.4 - 10.5 mg/dL 9.2   9.8   9.8    ?Total Protein 6.0 - 8.3 g/dL 7.1   7.1     ?Total Bilirubin 0.2 - 1.2 mg/dL 0.6   1.3     ?Alkaline Phos 39 - 117 U/L 57      ?AST 0 - 37 U/L 19   22     ?ALT 0 - 53 U/L 19   26     ? ? ?Lab Results  ?Component Value Date  ? WBC 6.0 08/26/2020  ? HGB 17.7 (H) 08/26/2020  ? HCT 52.3 (H) 08/26/2020  ? MCV 87.1 08/26/2020  ? PLT 219.0 08/26/2020  ? NEUTROABS 3.6 08/26/2020  ? ? ?ASSESSMENT & PLAN:  ?Polycythemia ?Longstanding elevated hemoglobin level ?Lab review ?08/10/2010 hemoglobin 17.3 ?11/12/2017: Hemoglobin 17.9 ?02/05/2020: Hemoglobin 18.3, hematocrit 55.5 ?03/15/2020: Hemoglobin 18, hematocrit 51.5 ?08/26/2020: Hemoglobin  17.7, hematocrit 52.3 ?  ?Lab review: 03/15/2020 ?JAK2 mutation: Negative ?Erythropoietin: 7.3 ?  ?  ?Treatment plan: Blood donation every 6 months ?Patient is monitoring his hemoglobin through the [employer]. ?  ? ? 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 ?I discussed with the patient the pathophysiology of Langerhans' cell histiocytosis.  It tends to be either local or systemic and we need to evaluate him with whole-body PET CT scan. ? ?If it is local and it has been resected there is no further treatment necessary. ?If it is systemic then we would have to run BRAF mutation testing and then determine if he would be a candidate for event neratinib.  There is a 50% chance that he may have a BRAF mutation.  If without a BRAF mutation he will need chemotherapy drugs. ? ?PET CT scan in 1 to 2 weeks and telephone visit after that to discuss results. ? ? ?Orders Placed This Encounter  ?Procedures  ? NM PET  Image Initial (PI) Skull Base To Thigh  ?  Standing Status:   Future  ?  Standing Expiration Date:   07/24/2022  ?  Order Specific Question:   If indicated for the ordered procedure, I authorize the administration of a radiopharmaceutical per Radiology protocol  ?  Answer:   Yes  ?  Order Specific Question:   Preferred imaging location?  ?  Answer:   [facility]  ?  Order Specific Question:   Release to patient  ?  Answer:   Immediate  ? ?The patient has a good understanding of the overall plan. he agrees with it. he will call with any problems that may develop before the next visit here. ?Total time spent: 30 mins including face to face time and time spent for planning, charting and co-ordination of care ? ? Harriette Ohara, MD ?07/24/21 ? ? ? I Gardiner Coins am scribing for Dr. Lindi Adie ? ?I have reviewed the above documentation for accuracy and completeness, and I agree with the above. ?  ?

## 2021-07-24 NOTE — Assessment & Plan Note (Signed)
06/21/2021: Colonoscopy for evaluation of polyps: Cecum, transverse colon polyp: Biopsy: Langerhans' cell histiocytosis and tubular adenoma CD1 8 positive, S100, CD45, CD68 and cyclin D1 positive ? ?I discussed with the patient the pathophysiology of Langerhans' cell histiocytosis.  It tends to be either local or systemic and we need to evaluate Philip Cooley with whole-body PET CT scan. ? ?If it is local and it has been resected there is no further treatment necessary. ?If it is systemic then we would have to run BRAF mutation testing and then determine if he would be a candidate for event neratinib.  There is a 50% chance that he may have a BRAF mutation.  If without a BRAF mutation he will need chemotherapy drugs. ? ?PET CT scan in 1 to 2 weeks and telephone visit after that to discuss results. ?

## 2021-07-24 NOTE — Assessment & Plan Note (Addendum)
Longstanding elevated hemoglobin level ?Lab review ?08/10/2010 hemoglobin 17.3 ?11/12/2017: Hemoglobin 17.9 ?02/05/2020: Hemoglobin 18.3, hematocrit 55.5 ?03/15/2020: Hemoglobin 18, hematocrit 51.5 ?08/26/2020: Hemoglobin 17.7, hematocrit 52.3 ?? ?Lab review: 03/15/2020 ?JAK2 mutation: Negative ?Erythropoietin: 7.3 ?? ?? ?Treatment plan: Blood donation every 6 months ?? ? ?

## 2021-07-25 ENCOUNTER — Telehealth: Payer: Self-pay | Admitting: Hematology and Oncology

## 2021-07-25 NOTE — Progress Notes (Signed)
? ?Patient Care Team: ?Billie Ruddy, MD as PCP - General (Family Medicine) ? ?DIAGNOSIS:  ?Encounter Diagnosis  ?Name Primary?  ? 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 ?  ?CHIEF COMPLIANT: LANGERHANS' CELL HISTIOCYTOSIS ?  ? ?INTERVAL HISTORY: Philip Cooley is a  56 y.o. male with above-mentioned LANGERHANS' CELL HISTIOCYTOSIS. He presents to the clinic for a follow-up.  He has a prior history of erythrocytosis which has been stable for the past 10 years. ? ? ?ALLERGIES:  is allergic to penicillins. ? ?MEDICATIONS:  ?Current Outpatient Medications  ?Medication Sig Dispense Refill  ? amLODipine (NORVASC) 10 MG tablet Take 1 tablet (10 mg total) by mouth daily. 90 tablet 3  ? fluticasone (FLONASE) 50 MCG/ACT nasal spray Place 1 spray into both nostrils daily. 16 g 6  ? hydrocortisone (ANUSOL-HC) 25 MG suppository Place 1 suppository (25 mg total) rectally 2 (two) times daily. 12 suppository 0  ? loratadine (CLARITIN) 10 MG tablet Take 10 mg by mouth daily.    ? montelukast (SINGULAIR) 10 MG tablet TAKE 1 TABLET(10 MG) BY MOUTH AT BEDTIME 30 tablet 5  ? Multiple Vitamin (MULTIVITAMIN WITH MINERALS) TABS Take 1 tablet by mouth daily.    ? ?No current facility-administered medications for this visit.  ? ? ?PHYSICAL EXAMINATION: ?ECOG PERFORMANCE STATUS: 0 - Asymptomatic ? ?There were no vitals filed for this visit. ?There were no vitals filed for this visit.  ? ? ?LABORATthe results of your scanORY DATA:  ?I have reviewed the data as listed ? ?  Latest Ref Rng & Units 08/26/2020  ? 12:03 PM 02/05/2020  ?  3:57 PM 11/12/2017  ?  5:11 PM  ?CMP  ?Glucose 70 - 99 mg/dL 90   89   93    ?BUN 6 - 23 mg/dL '14   11   13    '$ ?Creatinine 0.40 - 1.50 mg/dL 1.11   1.15   1.29    ?Sodium 135 - 145 mEq/L 137   139   138    ?Potassium 3.5 - 5.1 mEq/L 4.0   4.0   4.1    ?Chloride 96 - 112 mEq/L 102   102   100    ?CO2 19 - 32 mEq/L '22   25   28    '$ ?Calcium 8.4 - 10.5 mg/dL 9.2   9.8   9.8    ?Total Protein 6.0 - 8.3 g/dL 7.1   7.1      ?Total Bilirubin 0.2 - 1.2 mg/dL 0.6   1.3     ?Alkaline Phos 39 - 117 U/L 57      ?AST 0 - 37 U/L 19   22     ?ALT 0 - 53 U/L 19   26     ? ? ?Lab Results  ?Component Value Date  ? WBC 6.0 08/26/2020  ? HGB 17.7 (H) 08/26/2020  ? HCT 52.3 (H) 08/26/2020  ? MCV 87.1 08/26/2020  ? PLT 219.0 08/26/2020  ? NEUTROABS 3.6 08/26/2020  ? ? 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 ?08/04/2021: PET CT scan: No evidence of PET avid disease in chest abdomen or pelvis ? ?Treatment plan: based on the PET scan results, there is no indication for doing any surgeries or systemic treatments. ? ?Erythrocytosis: I discussed with the patient that previously JAK2  mutation was negative and therefore it is secondary polycythemia and I suspect that he may have obstructive sleep apnea and I encouraged him to talk to his primary care doctor about obtaining a sleep study for further evaluation. ? ?Follow-up with gastroenterology with regards to his polyps. ? ?Return to clinic on an as-needed basis ? ? ? ?No orders of the defined types were placed in this encounter. ? ?The patient has a good understanding of the overall plan. he agrees with it. he will call with any problems that may develop before the next visit here. ?Total time spent: 30 mins including face to face time and time spent for planning, charting and co-ordination of care ? ? Harriette Ohara, MD ?08/08/21 ? ? ? I Gardiner Coins am scribing for Dr. Lindi Adie ? ?I have reviewed the above documentation for accuracy and completeness, and I agree with the above. ?  ?

## 2021-07-25 NOTE — Telephone Encounter (Signed)
Per 5/1 los called and spoke to pt about f/u scan phone visit.  Pt confirmed appointments  ?

## 2021-08-02 ENCOUNTER — Ambulatory Visit (HOSPITAL_COMMUNITY)
Admission: RE | Admit: 2021-08-02 | Discharge: 2021-08-02 | Disposition: A | Discharge status: Home / Self Care | Payer: Managed Care, Other (non HMO) | Source: Ambulatory Visit | Attending: Hematology and Oncology | Admitting: Hematology and Oncology

## 2021-08-02 DIAGNOSIS — C966 Unifocal Langerhans-cell histiocytosis: Secondary | ICD-10-CM | POA: Insufficient documentation

## 2021-08-02 LAB — GLUCOSE, CAPILLARY: Glucose-Capillary: 107 mg/dL — ABNORMAL HIGH (ref 70–99)

## 2021-08-02 MED ORDER — FLUDEOXYGLUCOSE F - 18 (FDG) INJECTION
9.8000 | Freq: Once | INTRAVENOUS | Status: AC
  Administered 2021-08-02: 9.8 via INTRAVENOUS

## 2021-08-08 ENCOUNTER — Inpatient Hospital Stay (HOSPITAL_BASED_OUTPATIENT_CLINIC_OR_DEPARTMENT_OTHER): Payer: Managed Care, Other (non HMO) | Admitting: Hematology and Oncology

## 2021-08-08 DIAGNOSIS — C966 Unifocal Langerhans-cell histiocytosis: Secondary | ICD-10-CM | POA: Diagnosis not present

## 2021-08-08 NOTE — Assessment & Plan Note (Signed)
06/21/2021: Colonoscopy for evaluation of polyps: Cecum, transverse colon polyp: Biopsy: Langerhans' cell histiocytosis and tubular adenoma CD1 8 positive, S100, CD45, CD68 and cyclin D1 positive ? ?08/04/2021: PET CT scan: No evidence of PET avid disease in chest abdomen or pelvis ? ?Treatment plan: Since this is a local finding other than surgical resection there is no further treatment necessary. ?If it is systemic then we would have to run BRAF mutation testing and then determine if he would be a candidate for event neratinib.  There is a 50% chance that he may have a BRAF mutation.  If without a BRAF mutation he will need chemotherapy drugs. ? ?Return to clinic on an as-needed basis ?

## 2021-10-16 ENCOUNTER — Ambulatory Visit
Admission: EM | Admit: 2021-10-16 | Discharge: 2021-10-16 | Disposition: A | Discharge status: Home / Self Care | Payer: Managed Care, Other (non HMO) | Attending: Internal Medicine | Admitting: Internal Medicine

## 2021-10-16 ENCOUNTER — Encounter: Payer: Self-pay | Admitting: Emergency Medicine

## 2021-10-16 DIAGNOSIS — R0981 Nasal congestion: Secondary | ICD-10-CM

## 2021-10-16 DIAGNOSIS — J3489 Other specified disorders of nose and nasal sinuses: Secondary | ICD-10-CM

## 2021-10-16 MED ORDER — PREDNISONE 20 MG PO TABS: 20.0000 mg | ORAL_TABLET | Freq: Every day | ORAL | 0 refills | Status: AC

## 2021-10-16 NOTE — ED Provider Notes (Signed)
[name]    CSN: [id number] Arrival date & time: 10/16/21  1337      History   Chief Complaint Chief Complaint  Patient presents with   Nasal Congestion   Facial Pain   Headache    HPI Philip Cooley is a 56 y.o. male.   Patient presents with nasal congestion, sinus pressure, headache that started about 5 to 6 days ago.  Denies any known fever.  Denies any known sick contacts.  Denies chest pain, shortness of breath, cough, sore throat, ear pain, nausea, vomiting, diarrhea, abdominal pain.  Patient has taken daily allergy medicine with minimal improvement in symptoms.    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[place]) 07/24/2021   Polycythemia 02/09/2020   History of back strain 12/19/2018   ACUTE GINGIVITIS NONPLAQUE INDUCED 09/07/2009   Essential hypertension 06/17/2008   Allergic rhinitis 06/17/2008    Past Surgical History:  Procedure Laterality Date   COLONOSCOPY  2017   MS-MAC-suprep(good0-TA polyp   FINGER FRACTURE SURGERY Right 1997   middle finger   MASS EXCISION Right 08/14/2016   Procedure: EXCISIONAL BIOPSY MASS RIGHT SMALL FINGER;  Surgeon: Leanora Cover, MD;  Location: [place];  Service: Orthopedics;  Laterality: Right;   POLYPECTOMY  2017   TA   WISDOM TOOTH EXTRACTION         Home Medications    Prior to Admission medications   Medication Sig Start Date End Date Taking? Authorizing Provider  predniSONE (DELTASONE) 20 MG tablet Take 1 tablet (20 mg total) by mouth daily for 5 days. 10/16/21 10/21/21 Yes Breyonna Nault, Michele Rockers, FNP  amLODipine (NORVASC) 10 MG tablet Take 1 tablet (10 mg total) by mouth daily. 01/26/21   Billie Ruddy, MD  fluticasone  (FLONASE) 50 MCG/ACT nasal spray Place 1 spray into both nostrils daily. 05/22/21   Billie Ruddy, MD  hydrocortisone (ANUSOL-HC) 25 MG suppository Place 1 suppository (25 mg total) rectally 2 (two) times daily. 05/22/21   Billie Ruddy, MD  loratadine (CLARITIN) 10 MG tablet Take 10 mg by mouth daily.    [provider]  montelukast (SINGULAIR) 10 MG tablet TAKE 1 TABLET(10 MG) BY MOUTH AT BEDTIME 07/19/21   Billie Ruddy, MD  Multiple Vitamin (MULTIVITAMIN WITH MINERALS) TABS Take 1 tablet by mouth daily.    [provider]    Family History Family History  Problem Relation Age of Onset   Diabetes Mother    Cancer Mother    Colon cancer Neg Hx    Colon polyps Neg Hx    Esophageal cancer Neg Hx    Rectal cancer Neg Hx    Stomach cancer Neg Hx     Social History Social History   Tobacco Use   Smoking status: Never   Smokeless tobacco: Never  Vaping Use   Vaping Use: Never used  Substance Use Topics   Alcohol use: No   Drug use: No     Allergies   Penicillins   Review of Systems Review of Systems Per HPI  Physical Exam Triage Vital Signs ED Triage Vitals  Enc Vitals Group     BP 10/16/21 1517 (!) 139/92  Pulse Rate 10/16/21 1517 80     Resp 10/16/21 1517 17     Temp 10/16/21 1517 98 F (36.7 C)     Temp src --      SpO2 10/16/21 1517 97 %     Weight --      Height --      Head Circumference --      Peak Flow --      Pain Score 10/16/21 1516 5     Pain Loc --      Pain Edu? --      Excl. in [place]? --    No data found.  Updated Vital Signs BP (!) 139/92   Pulse 80   Temp 98 F (36.7 C)   Resp 17   SpO2 97%   Visual Acuity Right Eye Distance:   Left Eye Distance:   Bilateral Distance:    Right Eye Near:   Left Eye Near:    Bilateral Near:     Physical Exam Constitutional:      General: He is not in acute distress.    Appearance: Normal appearance. He is not toxic-appearing or diaphoretic.  HENT:     Head:  Normocephalic and atraumatic.     Right Ear: Tympanic membrane and ear canal normal.     Left Ear: Tympanic membrane and ear canal normal.     Nose: Congestion present.     Right Sinus: No maxillary sinus tenderness or frontal sinus tenderness.     Left Sinus: No maxillary sinus tenderness or frontal sinus tenderness.     Mouth/Throat:     Mouth: Mucous membranes are moist.     Pharynx: No posterior oropharyngeal erythema.  Eyes:     Extraocular Movements: Extraocular movements intact.     Conjunctiva/sclera: Conjunctivae normal.     Pupils: Pupils are equal, round, and reactive to light.  Cardiovascular:     Rate and Rhythm: Normal rate and regular rhythm.     Pulses: Normal pulses.     Heart sounds: Normal heart sounds.  Pulmonary:     Effort: Pulmonary effort is normal. No respiratory distress.     Breath sounds: Normal breath sounds. No stridor. No wheezing, rhonchi or rales.  Abdominal:     General: Abdomen is flat. Bowel sounds are normal.     Palpations: Abdomen is soft.  Musculoskeletal:        General: Normal range of motion.     Cervical back: Normal range of motion.  Skin:    General: Skin is warm and dry.  Neurological:     General: No focal deficit present.     Mental Status: He is alert and oriented to person, place, and time. Mental status is at baseline.  Psychiatric:        Mood and Affect: Mood normal.        Behavior: Behavior normal.      UC Treatments / Results  Labs (all labs ordered are listed, but only abnormal results are displayed) Labs Reviewed - No data to display  EKG   Radiology No results found.  Procedures Procedures (including critical care time)  Medications Ordered in UC Medications - No data to display  Initial Impression / Assessment and Plan / UC Course  I have reviewed the triage vital signs and the nursing notes.  Pertinent labs & imaging results that were available during my care of the patient were reviewed by me and  considered in my medical decision making (  see chart for details).     Patient has sinus pressure and nasal congestion with differential diagnoses including viral upper respiratory infection versus allergic rhinitis versus sinus infection.  Low suspicion for bacterial cause so will defer antibiotics.  I do think patient would benefit from steroid burst to decrease sinus pressure.  Patient has taken this before and tolerated well.  Patient was recently being seen by oncology given abnormal colonoscopy in March.  He had full work-up with negative PET scan.  I do think that short course of prednisone should be safe despite this given review of most recent office visit note.  Patient was given strict return precautions.  Patient verbalized understanding and was agreeable with plan. Final Clinical Impressions(s) / UC Diagnoses   Final diagnoses:  Sinus pressure  Nasal congestion     Discharge Instructions      I suspect that you have a viral illness that is causing sinus pressure.  I have prescribed prednisone to alleviate sinus pressure.  Please follow-up if symptoms persist or worsen.    ED Prescriptions     Medication Sig Dispense Auth. Provider   predniSONE (DELTASONE) 20 MG tablet Take 1 tablet (20 mg total) by mouth daily for 5 days. 5 tablet [place], Michele Rockers, [place]      PDMP not reviewed this encounter.   Teodora Medici, [place] 10/16/21 1537

## 2021-10-16 NOTE — ED Triage Notes (Signed)
Pt is present today with c/o nasal congestion, HA, and sinus pressure. Pt sx started last weekend

## 2021-10-16 NOTE — Discharge Instructions (Signed)
I suspect that you have a viral illness that is causing sinus pressure.  I have prescribed prednisone to alleviate sinus pressure.  Please follow-up if symptoms persist or worsen.

## 2021-12-29 ENCOUNTER — Other Ambulatory Visit: Payer: Self-pay | Admitting: Family Medicine

## 2021-12-29 DIAGNOSIS — I10 Essential (primary) hypertension: Secondary | ICD-10-CM

## 2022-01-01 ENCOUNTER — Ambulatory Visit: Payer: Managed Care, Other (non HMO) | Admitting: Family Medicine

## 2022-01-01 ENCOUNTER — Encounter: Payer: Self-pay | Admitting: Family Medicine

## 2022-01-01 VITALS — BP 142/86 | HR 95 | Temp 97.8°F | Ht 65.0 in | Wt 202.6 lb

## 2022-01-01 DIAGNOSIS — R062 Wheezing: Secondary | ICD-10-CM

## 2022-01-01 DIAGNOSIS — J019 Acute sinusitis, unspecified: Secondary | ICD-10-CM | POA: Diagnosis not present

## 2022-01-01 MED ORDER — PREDNISONE 20 MG PO TABS: ORAL_TABLET | ORAL | 0 refills | Status: DC

## 2022-01-01 MED ORDER — DOXYCYCLINE HYCLATE 100 MG PO CAPS: 100.0000 mg | ORAL_CAPSULE | Freq: Two times a day (BID) | ORAL | 0 refills | Status: DC

## 2022-01-01 NOTE — Progress Notes (Signed)
Established Patient Office Visit  Subjective   Patient ID: Philip Cooley, male    DOB: 1965/12/23  Age: 56 y.o. MRN: [id number]  Chief Complaint  Patient presents with   Wheezing    Patient complains of wheezing, x2 weeks,    Cough    Patient complains of cough, x2 weeks, Productive with yellow sputum, Tried Nyquil with little relief,    Ear Pain    Patient complains of bi-lateral ear pain, x2 weeks   Nasal Congestion    Patient complains of nasal congestion, x2 weeks     HPI   Seen with complaints of respiratory illness for couple weeks.  He feels like he has had some intermittent wheezing especially at night.  He had some cough for couple weeks productive of yellow sputum.  Has taken NyQuil with very little relief.  Intermittent bilateral earache.  Persistent nasal congestion.  Had some frontal sinus pressure past few days and occasional bloody and colored nasal discharge.  No fever.  Allergy to penicillin.  No history of diabetes.  He has hypertension treated with amlodipine.  Past Medical History:  Diagnosis Date   ACUTE GINGIVITIS NONPLAQUE INDUCED 09/07/2009   ALLERGIC RHINITIS 06/17/2008   Arthritis    hands and knees   HYPERTENSION 06/17/2008   on meds   Seasonal allergies    Past Surgical History:  Procedure Laterality Date   COLONOSCOPY  2017   MS-MAC-suprep(good0-TA polyp   FINGER FRACTURE SURGERY Right 1997   middle finger   MASS EXCISION Right 08/14/2016   Procedure: EXCISIONAL BIOPSY MASS RIGHT SMALL FINGER;  Surgeon: Leanora Cover, MD;  Location: [facility];  Service: Orthopedics;  Laterality: Right;   POLYPECTOMY  2017   TA   WISDOM TOOTH EXTRACTION      reports that he has never smoked. He has never used smokeless tobacco. He reports that he does not drink alcohol and does not use drugs. family history includes Cancer in his mother; Diabetes in his mother. Allergies  Allergen Reactions   Penicillins Swelling    Review of Systems   Constitutional:  Negative for chills and fever.  HENT:  Positive for ear pain and sinus pain. Negative for sore throat.   Respiratory:  Positive for cough.       Objective:     BP (!) 142/86 (BP Location: Left Arm, Patient Position: Sitting, Cuff Size: Normal)   Pulse 95   Temp 97.8 F (36.6 C) (Oral)   Ht _0  (1.651 m)   Wt 202 lb 9.6 oz (91.9 kg)   SpO2 99%   BMI 33.71 kg/m    Physical Exam Vitals reviewed.  Constitutional:      General: He is not in acute distress.    Appearance: Normal appearance.  HENT:     Right Ear: Tympanic membrane normal.     Left Ear: Tympanic membrane normal.     Mouth/Throat:     Mouth: Mucous membranes are moist.     Pharynx: Oropharynx is clear. No oropharyngeal exudate or posterior oropharyngeal erythema.  Cardiovascular:     Rate and Rhythm: Normal rate and regular rhythm.  Pulmonary:     Effort: Pulmonary effort is normal.     Breath sounds: No wheezing or rales.  Musculoskeletal:     Cervical back: Neck supple.  Lymphadenopathy:     Cervical: No cervical adenopathy.  Neurological:     Mental Status: He is alert.      No results found for  any visits on 01/01/22.    The 10-year ASCVD risk score (Arnett DK, et al., 2019) is: 12.7%    Assessment & Plan:   2-week history of upper respiratory symptoms.  Given recent frontal sinus pressure and persistent bloody and purulent discharge treat for sinusitis with doxycycline 100 mg twice daily for 10 days.  He has penicillin allergic.  Also wrote for limited prednisone 20 mg 2 tablets daily for 5 days.    No follow-ups on file.    Carolann Littler, MD

## 2022-01-17 ENCOUNTER — Ambulatory Visit: Payer: Managed Care, Other (non HMO) | Admitting: Family Medicine

## 2022-01-17 ENCOUNTER — Encounter: Payer: Self-pay | Admitting: Family Medicine

## 2022-01-17 VITALS — BP 120/88 | HR 88 | Temp 98.7°F | Wt 199.6 lb

## 2022-01-17 DIAGNOSIS — J329 Chronic sinusitis, unspecified: Secondary | ICD-10-CM

## 2022-01-17 DIAGNOSIS — R0989 Other specified symptoms and signs involving the circulatory and respiratory systems: Secondary | ICD-10-CM

## 2022-01-17 DIAGNOSIS — H66002 Acute suppurative otitis media without spontaneous rupture of ear drum, left ear: Secondary | ICD-10-CM | POA: Diagnosis not present

## 2022-01-17 LAB — POC COVID19 BINAXNOW: SARS Coronavirus 2 Ag: NEGATIVE

## 2022-01-17 LAB — POCT INFLUENZA A/B
Influenza A, POC: NEGATIVE
Influenza B, POC: NEGATIVE

## 2022-01-17 MED ORDER — FLUTICASONE PROPIONATE 50 MCG/ACT NA SUSP: 1.0000 | Freq: Every day | NASAL | 6 refills | Status: DC

## 2022-01-17 MED ORDER — LEVOFLOXACIN 500 MG PO TABS: 500.0000 mg | ORAL_TABLET | Freq: Every day | ORAL | 0 refills | Status: AC

## 2022-01-17 MED ORDER — LEVOCETIRIZINE DIHYDROCHLORIDE 5 MG PO TABS: 5.0000 mg | ORAL_TABLET | Freq: Every evening | ORAL | 1 refills | Status: DC

## 2022-01-17 NOTE — Progress Notes (Signed)
Subjective:    Patient ID: Philip Cooley, male    DOB: [birth year], 56 y.o.   MRN: [id number]  Chief Complaint  Patient presents with   Cough   Sinusitis    HPI Patient is a 56 yo male with pmh sig for allergies, HTN, lanerhan's cell histocytosis, preDM, h/o back pain who was seen today for ongoing concern.  Pt seen 01/01/22 by Dr. Sarajane Jews for sinusitis.  Pt given rx for doxycycline, prednisone.  Patient states symptoms improved for 3 days then returned.  Are becoming worse with increased congestion, cough, facial pressure, left ear feeling clogged.  Patient restarted Flonase 2-3 days ago.  Try not to blow nose is hard as previously had bloody mucus.  Past Medical History:  Diagnosis Date   ACUTE GINGIVITIS NONPLAQUE INDUCED 09/07/2009   ALLERGIC RHINITIS 06/17/2008   Arthritis    hands and knees   HYPERTENSION 06/17/2008   on meds   Seasonal allergies     Allergies  Allergen Reactions   Penicillins Swelling    ROS General: Denies fever, chills, night sweats, changes in weight, changes in appetite HEENT: Denies headaches, ear pain, changes in vision, rhinorrhea, sore throat  + nasal congestion, facial pressure, clogged left ear, throat irritation CV: Denies CP, palpitations, SOB, orthopnea Pulm: Denies SOB, wheezing  +cough  GI: Denies abdominal pain, nausea, vomiting, diarrhea, constipation GU: Denies dysuria, hematuria, frequency Msk: Denies muscle cramps, joint pains Neuro: Denies weakness, numbness, tingling Skin: Denies rashes, bruising Psych: Denies depression, anxiety, hallucinations     Objective:    Blood pressure 120/88, pulse 88, temperature 98.7 F (37.1 C), temperature source Oral, weight 199 lb 9.6 oz (90.5 kg), SpO2 98 %.  Gen. Pleasant, well-nourished, in no distress, normal affect   HEENT: [place]/AT, face symmetric, conjunctiva clear, no scleral icterus, PERRLA, EOMI, nares patent without drainage, TTP of maxillary sinuses, pharynx without erythema or exudate.   Left TM erythematous with suppurative fluid, pool, no rupture.  Right TM full.  No cervical lymphadenopathy. Lungs: no accessory muscle use, CTAB, no wheezes or rales Cardiovascular: RRR, no m/r/g, no peripheral edema Neuro:  A&Ox3, CN II-XII intact, normal gait Skin:  Warm, no lesions/ rash   Wt Readings from Last 3 Encounters:  01/17/22 199 lb 9.6 oz (90.5 kg)  01/01/22 202 lb 9.6 oz (91.9 kg)  07/24/21 198 lb 1.6 oz (89.9 kg)    Lab Results  Component Value Date   WBC 6.0 08/26/2020   HGB 17.7 (H) 08/26/2020   HCT 52.3 (H) 08/26/2020   PLT 219.0 08/26/2020   GLUCOSE 90 08/26/2020   CHOL 169 02/05/2020   TRIG 71 02/05/2020   HDL 56 02/05/2020   LDLCALC 97 02/05/2020   ALT 19 08/26/2020   AST 19 08/26/2020   NA 137 08/26/2020   K 4.0 08/26/2020   CL 102 08/26/2020   CREATININE 1.11 08/26/2020   BUN 14 08/26/2020   CO2 22 08/26/2020   TSH 1.04 08/26/2020   PSA 0.85 02/05/2020   HGBA1C 6.0 08/26/2020    Assessment/Plan:  Recurrent sinusitis  -Failed course of doxycycline -Allergy to penicillins -Start Levaquin -Continue saline nasal rinse.  Rx for Flonase sent to pharmacy - Plan: levocetirizine (XYZAL) 5 MG tablet, levofloxacin (LEVAQUIN) 500 MG tablet, Flonase  Acute suppurative otitis media of left ear without spontaneous rupture of tympanic membrane, recurrence not specified  -History penicillin allergy -Recent doxycycline use -Start Levaquin -Tylenol or ibuprofen as needed for pain/discomfort - Plan: levofloxacin (LEVAQUIN) 500 MG tablet  Chest congestion  -POC COVID and flu testing negative. -continue supportive care including warm fluids, rest. - Plan: POC COVID-19, POC Influenza A/B  Note for work given.  F/u as needed  Grier Mitts, MD

## 2022-01-18 ENCOUNTER — Telehealth: Payer: Self-pay | Admitting: Family Medicine

## 2022-01-18 IMAGING — DX DG CHEST 2V
2 series · 2 of 2 positions shown · non-contrast
Comparison: Chest radiograph 08/25/2020

CLINICAL DATA: Persistent cough for 3 weeks

EXAM:
CHEST - 2 VIEW

[chest pa]
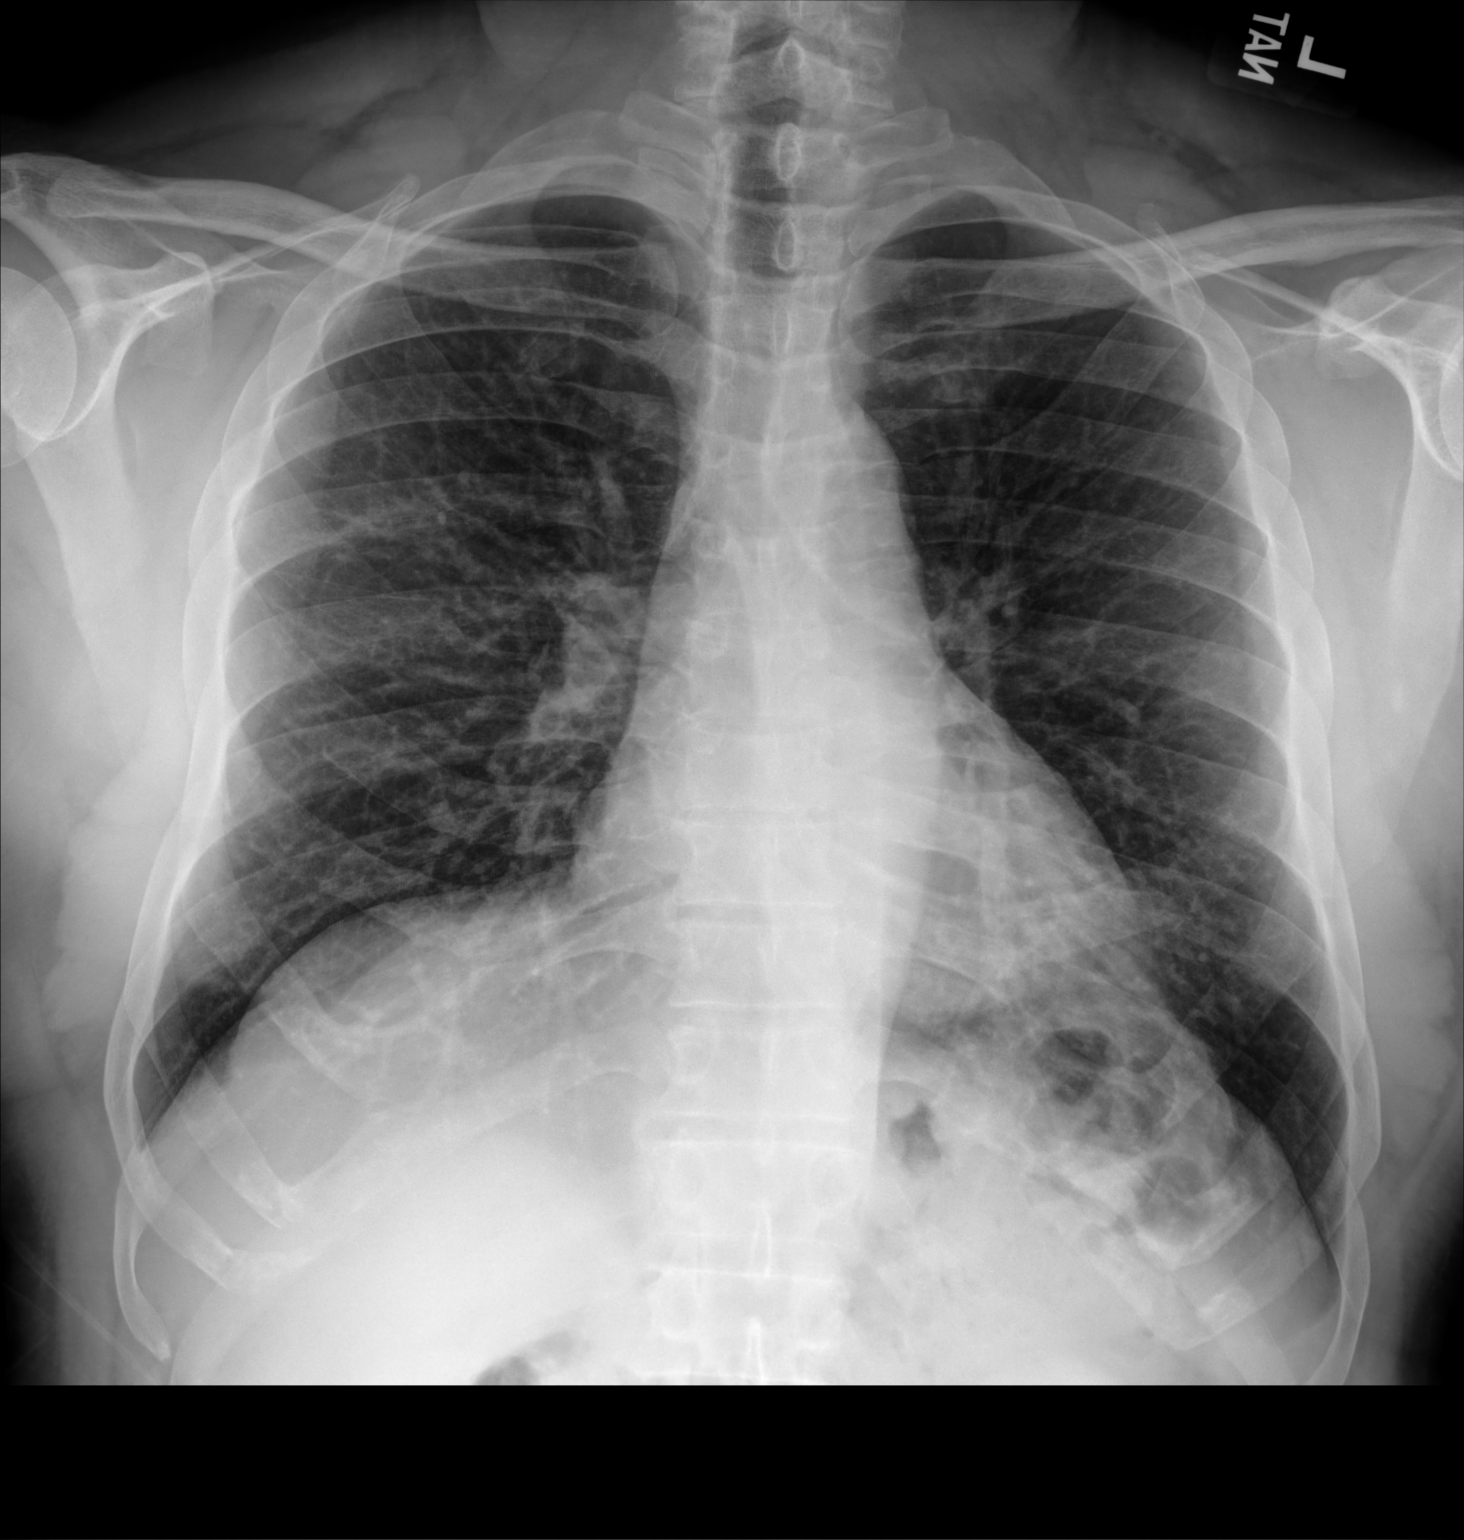

[chest lat]
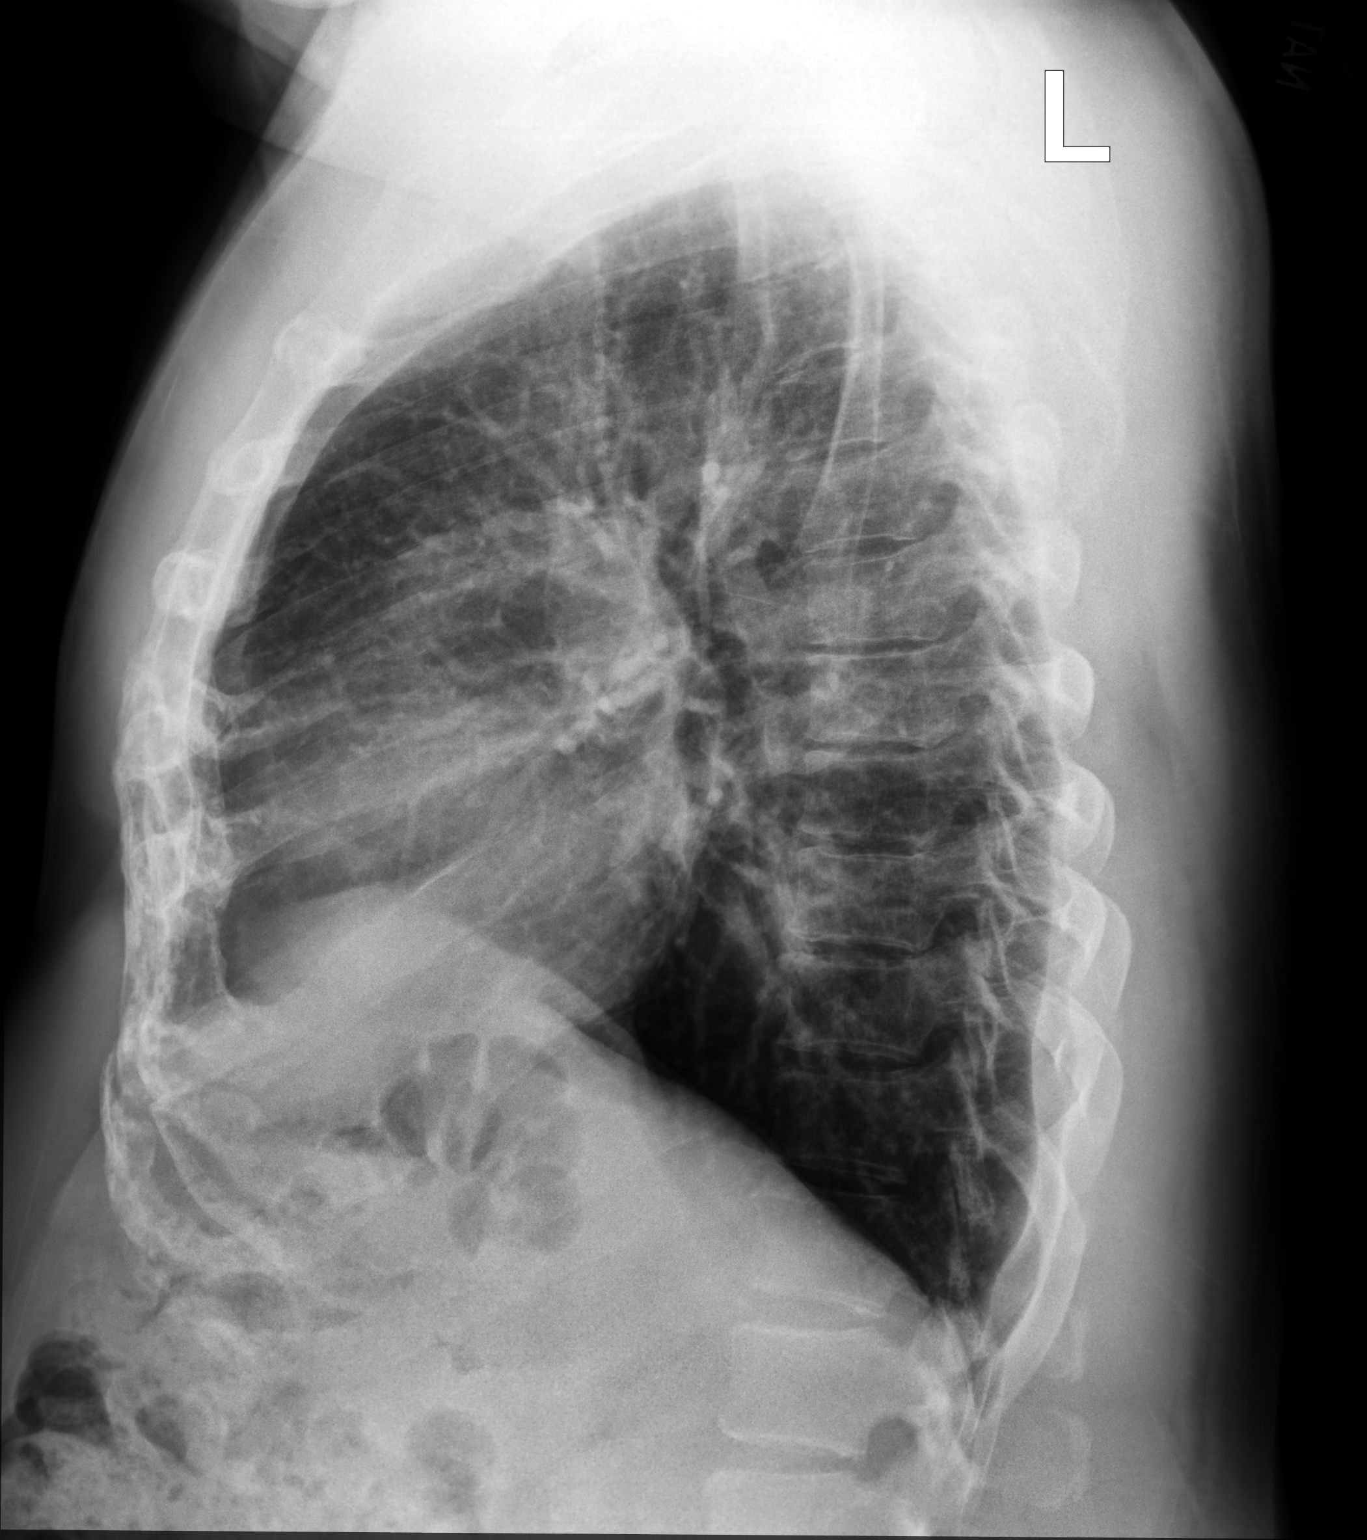

[2 of 2 positions shown; findings below may reference images not displayed]

FINDINGS: The cardiomediastinal silhouette is normal.

There is no focal consolidation or pulmonary edema. There is no
pleural effusion or pneumothorax.

There is no acute osseous abnormality.
IMPRESSION: Stable chest with no radiographic evidence of acute cardiopulmonary
process.

## 2022-01-18 NOTE — Telephone Encounter (Signed)
Pt was seen on 10-25-and would like update work note to return back to work on 01-22-2022. Please put work note in his [employer]

## 2022-01-19 NOTE — Telephone Encounter (Signed)
Ok for note 

## 2022-01-22 ENCOUNTER — Encounter: Payer: Self-pay | Admitting: Family Medicine

## 2022-01-22 NOTE — Telephone Encounter (Signed)
Work note sent to [employer] as requested.

## 2022-03-08 ENCOUNTER — Ambulatory Visit: Payer: Managed Care, Other (non HMO) | Admitting: Family Medicine

## 2022-03-08 ENCOUNTER — Encounter: Payer: Self-pay | Admitting: Family Medicine

## 2022-03-08 VITALS — BP 120/90 | HR 70 | Temp 99.0°F | Wt 201.8 lb

## 2022-03-08 DIAGNOSIS — H66003 Acute suppurative otitis media without spontaneous rupture of ear drum, bilateral: Secondary | ICD-10-CM | POA: Diagnosis not present

## 2022-03-08 DIAGNOSIS — R059 Cough, unspecified: Secondary | ICD-10-CM | POA: Diagnosis not present

## 2022-03-08 DIAGNOSIS — J302 Other seasonal allergic rhinitis: Secondary | ICD-10-CM

## 2022-03-08 DIAGNOSIS — R0981 Nasal congestion: Secondary | ICD-10-CM | POA: Diagnosis not present

## 2022-03-08 DIAGNOSIS — J329 Chronic sinusitis, unspecified: Secondary | ICD-10-CM

## 2022-03-08 LAB — POC COVID19 BINAXNOW: SARS Coronavirus 2 Ag: NEGATIVE

## 2022-03-08 LAB — POCT INFLUENZA A/B
Influenza A, POC: NEGATIVE
Influenza B, POC: NEGATIVE

## 2022-03-08 MED ORDER — MONTELUKAST SODIUM 10 MG PO TABS: 10.0000 mg | ORAL_TABLET | Freq: Every day | ORAL | 3 refills | Status: DC

## 2022-03-08 MED ORDER — AZITHROMYCIN 250 MG PO TABS: ORAL_TABLET | ORAL | 0 refills | Status: AC

## 2022-03-08 NOTE — Patient Instructions (Signed)
It looks like a square pillow that you are talking about is the montelukast (Singulair) which is the allergy medicine only available by prescription.  A new prescription was sent to your pharmacy for this medication.  You can also use the Claritin along with it if needed.  A prescription for azithromycin was sent into the pharmacy for your ear infection.  They can also help this infection.  Let us know if you are continuing to have problems.

## 2022-03-08 NOTE — Progress Notes (Signed)
Subjective:    Patient ID: Philip Cooley, male    DOB: 1966-01-21, 56 y.o.   MRN: [id number]  Chief Complaint  Patient presents with   Nasal Congestion    Pt reports sx of congestion, cough-yellow phlegms going on for a week and neck pain. Denied sx of fever, sore throat, fatiqgue.Taking Nyquil and Zinc.     HPI Patient is a 56 year old male with pmh sig for HTN, allergies/allergic rhinitis, Langerhans cell histiocytosis, polycythemia who was seen today for ongoing concern.  Patient endorses continued sinus issues since the last time he was sick in October.  Patient states symptoms improved with medications then return.  Currently with intermittent productive and dry cough, nasal congestion, L ear feels different, and neck soreness.  Cough worse at night and first thing in am.  Pt denies facial pain/ pressure, sore throat, fever, chills.  Taking OTC antihistamine Claritin.  States had a prescription for an allergy med that was helpful, but is no longer receiving it.  Seen by ENT in the past for recurrent sinusitis.    Past Medical History:  Diagnosis Date   ACUTE GINGIVITIS NONPLAQUE INDUCED 09/07/2009   ALLERGIC RHINITIS 06/17/2008   Arthritis    hands and knees   HYPERTENSION 06/17/2008   on meds   Seasonal allergies     Allergies  Allergen Reactions   Penicillins Swelling    ROS General: Denies fever, chills, night sweats, changes in weight, changes in appetite HEENT: Denies headaches, ear pain, changes in vision, rhinorrhea, sore throat  +sensation of fluid in ears, nasal congestion CV: Denies CP, palpitations, SOB, orthopnea Pulm: Denies SOB +cough, wheezing GI: Denies abdominal pain, nausea, vomiting, diarrhea, constipation GU: Denies dysuria, hematuria, frequency Msk: Denies muscle cramps, joint pains + posterior neck soreness Neuro: Denies weakness, numbness, tingling Skin: Denies rashes, bruising Psych: Denies depression, anxiety, hallucinations     Objective:     Blood pressure (!) 120/90, pulse 70, temperature 99 F (37.2 C), temperature source Oral, weight 201 lb 12.8 oz (91.5 kg), SpO2 98 %.  Gen. Pleasant, well-nourished, in no distress, normal affect   HEENT: [place]/AT, face symmetric, conjunctiva clear, no scleral icterus, PERRLA, EOMI, nares patent without drainage, pharynx without erythema or exudate.  B/l TM erythema, supportive fluid, and fullness Lungs: no accessory muscle use, CTAB, no wheezes or rales Cardiovascular: RRR, no m/r/g, no peripheral edema Neuro:  A&Ox3, CN II-XII intact, normal gait Skin:  Warm, no lesions/ rash   Wt Readings from Last 3 Encounters:  03/08/22 201 lb 12.8 oz (91.5 kg)  01/17/22 199 lb 9.6 oz (90.5 kg)  01/01/22 202 lb 9.6 oz (91.9 kg)    Lab Results  Component Value Date   WBC 6.0 08/26/2020   HGB 17.7 (H) 08/26/2020   HCT 52.3 (H) 08/26/2020   PLT 219.0 08/26/2020   GLUCOSE 90 08/26/2020   CHOL 169 02/05/2020   TRIG 71 02/05/2020   HDL 56 02/05/2020   LDLCALC 97 02/05/2020   ALT 19 08/26/2020   AST 19 08/26/2020   NA 137 08/26/2020   K 4.0 08/26/2020   CL 102 08/26/2020   CREATININE 1.11 08/26/2020   BUN 14 08/26/2020   CO2 22 08/26/2020   TSH 1.04 08/26/2020   PSA 0.85 02/05/2020   HGBA1C 6.0 08/26/2020    Assessment/Plan:  Acute suppurative otitis media of both ears without spontaneous rupture of tympanic membranes, recurrence not specified  - Plan: azithromycin (ZITHROMAX) 250 MG tablet  Cough, unspecified type  - Plan:  POC COVID-19, POC Influenza A/B  Nasal congestion  Recurrent sinusitis  Seasonal allergies  - Plan: montelukast (SINGULAIR) 10 MG tablet  Start abx for b/l AOM.  Allergy to PCN.  Recent doxy use for h/o recurrent sinusitis.   Rx for Azithromycin sent to pharmacy. Restart singulair, rx sent to pharmacy.    COVID and flu test negative in clinic.  Continue supportive care.  Given precautions.  Given note for work.  F/u prn for continued or worsening  symptoms.  Grier Mitts, MD

## 2022-03-28 ENCOUNTER — Encounter: Payer: Self-pay | Admitting: Family Medicine

## 2022-03-28 ENCOUNTER — Ambulatory Visit: Payer: Managed Care, Other (non HMO) | Admitting: Family Medicine

## 2022-03-28 VITALS — BP 128/92 | HR 79 | Temp 98.3°F | Wt 203.6 lb

## 2022-03-28 DIAGNOSIS — R200 Anesthesia of skin: Secondary | ICD-10-CM | POA: Diagnosis not present

## 2022-03-28 DIAGNOSIS — G43109 Migraine with aura, not intractable, without status migrainosus: Secondary | ICD-10-CM | POA: Diagnosis not present

## 2022-03-28 DIAGNOSIS — H539 Unspecified visual disturbance: Secondary | ICD-10-CM

## 2022-03-28 DIAGNOSIS — J302 Other seasonal allergic rhinitis: Secondary | ICD-10-CM | POA: Diagnosis not present

## 2022-03-28 MED ORDER — LEVOCETIRIZINE DIHYDROCHLORIDE 5 MG PO TABS: 5.0000 mg | ORAL_TABLET | Freq: Every evening | ORAL | 3 refills | Status: DC

## 2022-03-28 NOTE — Progress Notes (Signed)
Subjective:    Patient ID: Philip Cooley, male    DOB: 08-May-1965, 57 y.o.   MRN: [id number]  Chief Complaint  Patient presents with   Follow-up    HPI Patient is a 57 year old male with pmh sig for HTN, seasonal allergies, polycythemia, Langerhans cell histiocytosis, chronic low back pain who was seen today for f/u.  Patient notes some improvement in sinus symptoms with daily home remedy, but still with cough.  States Rx written for allergy medication at last visit was the wrong one.  Has old prescription bottle of levocetirizine 5 mg, requesting refill.    When patient was younger he had history of migraines but started as a flash seen in the corner of left eye, then developed headache after 15 minutes.  In the past use sublingual Imitrex?  And Excedrin Migraine.  Endorses increased frequency of flashing component of left eye over the last few months.  Flash spreads across eye over 15 minutes.  Patient no longer developing headache but has soreness on left temple.  Pt had an episode last week where vision in left eye with black for a few seconds while at work.  Patient wears glasses.  Last eye exam a year or more ago.  Not currently on blood thinners.  Endorses increased stress from family.  Patient notes numbness, heaviness in bilateral LEs, left greater than right.  Numbness travels along posterior and lateral LEs stopping above the knees.  Occurs while standing still.  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  Denies recent injury.  Patient has a history of intermittent back pain.  Stretching twice a day.  In the past seen by Ortho and PT.  Endorses intermittent tightness and left-sided low back.  Patient inquires about when next colonoscopy is due.  Had colonoscopy 06/21/2021.  1 tubular adenoma removed with path consistent with Langerhans cell histiocytosis.  Past Medical History:  Diagnosis Date   ACUTE GINGIVITIS NONPLAQUE INDUCED 09/07/2009   ALLERGIC  RHINITIS 06/17/2008   Arthritis    hands and knees   HYPERTENSION 06/17/2008   on meds   Seasonal allergies     Allergies  Allergen Reactions   Penicillins Swelling    ROS General: Denies fever, chills, night sweats, changes in weight, changes in appetite HEENT: Denies headaches, ear pain, rhinorrhea, sore throat  + changes in vision CV: Denies CP, palpitations, SOB, orthopnea Pulm: Denies SOB, wheezing + cough GI: Denies abdominal pain, nausea, vomiting, diarrhea, constipation GU: Denies dysuria, hematuria, frequency Msk: Denies muscle cramps, joint pains + left-sided low back pain Neuro: Denies weakness, tingling  + bilateral LE numbness Skin: Denies rashes, bruising Psych: Denies depression, anxiety, hallucinations     Objective:    Blood pressure (!) 128/92, pulse 79, temperature 98.3 F (36.8 C), temperature source Oral, weight 203 lb 9.6 oz (92.4 kg), SpO2 98 %.   Gen. Pleasant, well-nourished, in no distress, normal affect   HEENT: [place]/AT, face symmetric, conjunctiva clear, no scleral icterus, PERRLA, EOMI, nares patent without drainage Lungs: no accessory muscle use, CTAB, no wheezes or rales Cardiovascular: RRR, no m/r/g, no peripheral edema Musculoskeletal: No TTP of cervical, thoracic, lumbar, paraspinal muscles, hips, posterior thigh.  Left-sided lumbar paraspinal muscles tight.  Normal range of motion of bilateral hips and back.  No deformities, no cyanosis or clubbing. Normal tone and strength in bilateral LEs. Neuro:  A&Ox3, CN II-XII intact, normal gait Skin:  Warm, no lesions/ rash  Wt Readings from Last 3 Encounters:  03/28/22  203 lb 9.6 oz (92.4 kg)  03/08/22 201 lb 12.8 oz (91.5 kg)  01/17/22 199 lb 9.6 oz (90.5 kg)    Lab Results  Component Value Date   WBC 6.0 08/26/2020   HGB 17.7 (H) 08/26/2020   HCT 52.3 (H) 08/26/2020   PLT 219.0 08/26/2020   GLUCOSE 90 08/26/2020   CHOL 169 02/05/2020   TRIG 71 02/05/2020   HDL 56 02/05/2020   LDLCALC  97 02/05/2020   ALT 19 08/26/2020   AST 19 08/26/2020   NA 137 08/26/2020   K 4.0 08/26/2020   CL 102 08/26/2020   CREATININE 1.11 08/26/2020   BUN 14 08/26/2020   CO2 22 08/26/2020   TSH 1.04 08/26/2020   PSA 0.85 02/05/2020   HGBA1C 6.0 08/26/2020    Assessment/Plan:  Ocular migraine  -Discussed decreasing/limiting stress -Given information on other ways to reduce frequency of migraines -Consider imaging and referral to neurology given change in migraines -Discussed scheduling regular eye exam with patient's optometrist. - Plan: Ambulatory referral to Ophthalmology  Changes in vision  -Concern for vessel occlusion given recent history of transient vision loss -Given strict precautions - Plan: Ambulatory referral to Ophthalmology  Seasonal allergies  - Plan: levocetirizine (XYZAL) 5 MG tablet  Bilateral leg numbness  -Times last few months -Per chart review MRI lumbar spine 2021 with L5/S1 moderate disc extrusion into left lateral recess severely affects the descending left S1 nerve.  Lesser disc degeneration at L3-L4.  Degenerative endplate Schmorl nodes at that level, and neck superior L3 endplate with mild associated marrow edema. -Given increased symptoms discussed repeating imaging and following up with Ortho -Continue stretching. -Consider referral to a different PT office.  F/u as needed  Grier Mitts, MD

## 2022-03-28 NOTE — Patient Instructions (Addendum)
An MRI of your back from 2021 had a disc herniation at L5-S1.  Consider following up with Ortho and repeating imaging.  For continued cough consider scheduling follow-up with ENT.  Next colonoscopy due 05/2028 per GI notes.  GENERAL HEADACHE INSTRUCTIONS Headache Preventive Treatment: Please keep in mind that it takes 4-6 weeks for the medication to start working well and 2-3 months at the appropriate dose before deciding if it will be useful or not. If it is not helping at all by this time, then we will discuss other medications to try. Supplements may take 3-6 months until you see full effect.    Natural supplements: Magnesium Oxide or Magnesium Glycinate 500 mg at bed (up to 800 mg daily) Coenzyme Q10 300 mg in AM Vitamin B2- 200 mg twice a day   Add 1 supplement at a time since even natural supplements can have undesirable side effects. You can sometimes buy supplements cheaper (especially Coenzyme Q10) at www.https://compton-perez.com/ or at [employer].   Vitamins and herbs that show potential:   Magnesium: Magnesium (250 mg twice a day or 500 mg at bed) has a relaxant effect on smooth muscles such as blood vessels. Individuals suffering from frequent or daily headache usually have low magnesium levels which can be increase with daily supplementation of 400-750 mg. Three trials found 40-90% average headache reduction  when used as a preventative. Magnesium also demonstrated the benefit in menstrually related migraine.  Magnesium is part of the messenger system in the serotonin cascade and it is a good muscle relaxant.  It is also useful for constipation which can be a side effect of other medications used to treat migraine. Good sources include nuts, whole grains, and tomatoes. Side Effects: loose stool/diarrhea Riboflavin (vitamin B 2) 200 mg twice a day. This vitamin assists nerve cells in the production of ATP a principal energy storing molecule.  It is necessary for many chemical reactions in the body.   There have been at least 3 clinical trials of riboflavin using 400 mg per day all of which suggested that migraine frequency can be decreased.  All 3 trials showed significant improvement in over half of migraine sufferers.  The supplement is found in bread, cereal, milk, meat, and poultry.  Most Americans get more riboflavin than the recommended daily allowance, however riboflavin deficiency is not necessary for the supplements to help prevent headache. Side effects: energizing, Bohanon urine   Coenzyme Q10: This is present in almost all cells in the body and is critical component for the conversion of energy.  Recent studies have shown that a nutritional supplement of CoQ10 can reduce the frequency of migraine attacks by improving the energy production of cells as with riboflavin.  Doses of 150 mg twice a day have been shown to be effective.   Melatonin: Increasing evidence shows correlation between melatonin secretion and headache conditions.  Melatonin supplementation has decreased headache intensity and duration.  It is widely used as a sleep aid.  Sleep is natures way of dealing with migraine.  A dose of 3 mg is recommended to start for headaches including cluster headache. Higher doses up to 15 mg has been reviewed for use in Cluster headache and have been used. The rationale behind using melatonin for cluster is that many theories regarding the cause of Cluster headache center around the disruption of the normal circadian rhythm in the brain.  This helps restore the normal circadian rhythm.     HEADACHE DIET: Foods and beverages which may trigger  migraine Note that only 20% of headache patients are food sensitive. You will know if you are food sensitive if you get a headache consistently 20 minutes to 2 hours after eating a certain food. Only cut out a food if it causes headaches, otherwise you might remove foods you enjoy! What matters most for diet is to eat a well balanced healthy diet full of  vegetables and low fat protein, and to not miss meals.   Chocolate, other sweets ALL cheeses except cottage and cream cheese Dairy products, yogurt, sour cream, ice cream Liver Meat extracts (Bovril, Marmite, meat tenderizers) Meats or fish which have undergone aging, fermenting, pickling or smoking. These include: Hotdogs,salami,Lox,sausage, mortadellas,smoked salmon, pepperoni, Pickled herring Pods of broad bean (English beans, Chinese pea pods, [place] (fava) beans, lima and navy beans Ripe avocado, ripe banana Yeast extracts or active yeast preparations such as Brewer's or Fleishman's (commercial bakes goods are permitted) Tomato based foods, pizza (lasagna, etc.)   MSG (monosodium glutamate) is disguised as many things; look for these common aliases: Monopotassium glutamate Autolysed yeast Hydrolysed protein Sodium caseinate "flavorings" "all natural preservatives" Nutrasweet   Avoid all other foods that convincingly provoke headaches.   Resources: The Dizzy Lu Duffel Your Headache Diet, migrainestrong.com  https://www.aguirre.org/   Caffeine and Migraine For patients that have migraine, caffeine intake more than 3 days per week can lead to dependency and increased migraine frequency. I would recommend cutting back on your caffeine intake as best you can. The recommended amount of caffeine is 200-300 mg daily, although migraine patients may experience dependency at even lower doses. While you may notice an increase in headache temporarily, cutting back will be helpful for headaches in the long run. For more information on caffeine and migraine, visit: https://americanmigrainefoundation.org/resource-library/caffeine-and-migraine/   Headache Prevention Strategies:   1. Maintain a headache diary; learn to identify and avoid triggers.  - This can be a simple note where you log when you had a headache, associated symptoms, and  medications used - There are several smartphone apps developed to help track migraines: Migraine Buddy, Migraine Monitor, Curelator N1-Headache App   Common triggers include: Emotional triggers: Emotional/Upset family or friends Emotional/Upset occupation Business reversal/success Anticipation anxiety Crisis-serious Post-crisis periodNew job/position   Physical triggers: Vacation Day Weekend Strenuous Exercise High Altitude Location New Move Menstrual Day Physical Illness Oversleep/Not enough sleep Weather changes Light: Photophobia or light sesnitivity treatment involves a balance between desensitization and reduction in overly strong input. Use dark polarized glasses outside, but not inside. Avoid bright or fluorescent light, but do not dim environment to the point that going into a normally lit room hurts. Consider FL-41 tint lenses, which reduce the most irritating wavelengths without blocking too much light.  These can be obtained at axonoptics.com or theraspecs.com Foods: see list above.   2. Limit use of acute treatments (over-the-counter medications, triptans, etc.) to no more than 2 days per week or 10 days per month to prevent medication overuse headache (rebound headache).     3. Follow a regular schedule (including weekends and holidays): Don't skip meals. Eat a balanced diet. 8 hours of sleep nightly. Minimize stress. Exercise 30 minutes per day. Being overweight is associated with a 5 times increased risk of chronic migraine. Keep well hydrated and drink 6-8 glasses of water per day.   4. Initiate non-pharmacologic measures at the earliest onset of your headache. Rest and quiet environment. Relax and reduce stress. Breathe2Relax is a free app that can instruct you on  some simple relaxtion and breathing techniques. Http://Dawnbuse.com is a    free website that provides teaching videos on relaxation.  Also, there are  many apps that   can be downloaded for "mindful"  relaxation.  An app called YOGA NIDRA will help walk you through mindfulness. Another app called Calm can be downloaded to give you a structured mindfulness guide with daily reminders and skill development. Headspace for guided meditation Mindfulness Based Stress Reduction Online Course: www.palousemindfulness.com Cold compresses.   5. Don't wait!! Take the maximum allowable dosage of prescribed medication at the first sign of migraine.   6. Compliance:  Take prescribed medication regularly as directed and at the first sign of a migraine.   7. Communicate:  Call your physician when problems arise, especially if your headaches change, increase in frequency/severity, or become associated with neurological symptoms (weakness, numbness, slurred speech, etc.).   8. Headache/pain management therapies: Consider various complementary methods, including medication, behavioral therapy, psychological counselling, biofeedback, massage therapy, acupuncture, dry needling, and other modalities.  Such measures may reduce the need for medications. Counseling for pain management, where patients learn to function and ignore/minimize their pain, seems to work very well.   9. Recommend changing family's attention and focus away from patient's headaches. Instead, emphasize daily activities. If first question of day is 'How are your headaches/Do you have a headache today?', then patient will constantly think about headaches, thus making them worse. Goal is to re-direct attention away from headaches, toward daily activities and other distractions.   10. Helpful Websites: www.AmericanHeadacheSociety.org VoipObserver.it www.headaches.org GolfingFamily.no www.achenet.org   11. HEADACHE EXPECTATIONS: There are many types of headaches, and only a rare few in which complete relief can be expected.  In general, there is no cure for headache, especially migraine based headaches.  There is nothing available that  completely prevents headaches from occurring, breaking through, or having periodic flare-ups and fluctuations.  Regardless of what you are using on a daily basis for prevention, episodic headaches should still be expected, and periods where frequency may escalate and fluctuate are unavoidable.   There is no quick fix for most headaches.  Furthermore, the longer you have had high frequency headaches (such as chronic daily headache), the longer it will likely take to expect any improvement.  In fact, some people will never improve, regardless of how many medications or other treatments we try.  Our treatment strategy is to evaluate for possible causes of your headache, although testing is usually always normal, even in cases of daily continuous headaches for years.  Most types of headache such as migraine are electrical brain disorders (similar to how epilepsy is an electrical brain disorders).  Therefore, there is no testing that will reveal this "dysfunctional electrical circuitry" such on MRI, or other testing.  We try to find a medication that may help lessen the frequency and/or severity of your headaches.  The goal is not to completely stop them from happening, although if that happens, great!  Different people respond to different medications, and some people just don't respond to anything, so it's usually a matter of trying different options.  We cannot predict if or when exactly you will respond to a treatment that we provide.   Preventive headache medications take 4-6 weeks to start working, and 2-3 months to see full effect, assuming you reach an effective dose.  Therefore, calling or messaging frequently because you have a headache flare prior to the 3 month mark is unlikely to change anything, and unfortunately there  is nothing available that will expedite this, so please try to avoid this.  Our recommendation will generally be to give it adequate time first.  If you are unable to wait it out for  medications to work, we can also try IV infusions for some temporary relief.     In general, the best that preventive medications or other treatments (including Botox) are able to offer in migraine management (variable in other headache types) is a 50% improvement in frequency and/or severity of headache.  That is our goal, and any additional benefit is considered a bonus.  Some people do significantly better than this, others do not get close to this.  Therefore, if your headaches are not improving by at least 3 months on your preventive strategy, contact us and we can discuss further adjustments.  Keep in mind that complete headache cure is not a realistic expectation. For

## 2022-04-18 ENCOUNTER — Encounter: Payer: Self-pay | Admitting: Family Medicine

## 2022-04-18 ENCOUNTER — Ambulatory Visit: Payer: Managed Care, Other (non HMO) | Admitting: Family Medicine

## 2022-04-18 VITALS — BP 128/86 | HR 75 | Temp 98.4°F | Ht 65.0 in | Wt 205.6 lb

## 2022-04-18 DIAGNOSIS — M7730 Calcaneal spur, unspecified foot: Secondary | ICD-10-CM

## 2022-04-18 DIAGNOSIS — M79671 Pain in right foot: Secondary | ICD-10-CM

## 2022-04-18 DIAGNOSIS — M2142 Flat foot [pes planus] (acquired), left foot: Secondary | ICD-10-CM

## 2022-04-18 DIAGNOSIS — M2141 Flat foot [pes planus] (acquired), right foot: Secondary | ICD-10-CM

## 2022-04-18 DIAGNOSIS — J302 Other seasonal allergic rhinitis: Secondary | ICD-10-CM

## 2022-04-18 DIAGNOSIS — M79672 Pain in left foot: Secondary | ICD-10-CM | POA: Diagnosis not present

## 2022-04-18 NOTE — Progress Notes (Signed)
   Established Patient Office Visit   Subjective  Patient ID: Philip Cooley, male    DOB: [birth year]  Age: 57 y.o. MRN: [id number]  Chief Complaint  Patient presents with   Foot Pain    Pt reports pain on both foot, more on left side. Noticed the pain a wk ago.    Follow-up    Patient seen today for follow-up.  Endorses some improvement in sinus symptoms.  Unable to obtain Singulair 2/2 cost over $200.  Taking Flonase, Claritin, Xyzal and using nasal lavage.  Patient was unaware not to use tap water in lavage.  Patient endorses bilateral foot pain.  Has history of flatfeet.  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  Typically shifts weight back-and-forth on each foot 2/2 pain/numbness/tingling.  Left dorsum of the midfoot with pain.  Endorses right heel pain.  States in the past may have been told he had heel spurs.  Denies LE edema since wearing taller socks.  Still having left leg numbness/tingling laterally.      Review of Systems  Musculoskeletal:        B/l foot pain, R heel pain, flat feet  Neurological:  Positive for tingling.       Numbness   Negative unless stated above    Objective:     BP 128/86 (BP Location: Left Arm, Patient Position: Sitting, Cuff Size: Large)   Pulse 75   Temp 98.4 F (36.9 C) (Oral)   Ht '5\' 5"'$  (1.651 m)   Wt 205 lb 9.6 oz (93.3 kg)   SpO2 97%   BMI 34.21 kg/m    Physical Exam Constitutional:      Appearance: Normal appearance.  HENT:     Head: Normocephalic and atraumatic.     Right Ear: Tympanic membrane normal.     Left Ear: Tympanic membrane normal.     Nose: Nose normal.     Mouth/Throat:     Mouth: Mucous membranes are moist.  Eyes:     Extraocular Movements: Extraocular movements intact.     Conjunctiva/sclera: Conjunctivae normal.     Pupils: Pupils are equal, round, and reactive to light.  Cardiovascular:     Rate and Rhythm: Normal rate.  Pulmonary:     Effort: Pulmonary effort is normal.   Musculoskeletal:       Feet:  Skin:    General: Skin is warm and dry.  Neurological:     Mental Status: He is alert and oriented to person, place, and time.    No results found for any visits on 04/18/22.    Assessment & Plan:  Pes planus of both feet -Discussed stretching and insoles to help with foot pain. -Supportive care including Tylenol or NSAIDs as needed -Refer to podiatry -     Ambulatory referral to Podiatry  Pain of left midfoot -     Ambulatory referral to Podiatry  Heel pain, bilateral -Likely 2/2 heel spurs -     Ambulatory referral to Podiatry  Calcaneal spur, unspecified laterality -     Ambulatory referral to Podiatry  Seasonal allergies -unable to start singulair 2/2 cost. -Continue current allergy regimen of Claritin, Xyzal, Flonase -Refer to allergist for continued or worsening symptoms   No follow-ups on file.   Billie Ruddy, MD

## 2022-04-24 ENCOUNTER — Ambulatory Visit: Payer: Managed Care, Other (non HMO) | Admitting: Podiatry

## 2022-04-24 ENCOUNTER — Ambulatory Visit (INDEPENDENT_AMBULATORY_CARE_PROVIDER_SITE_OTHER): Payer: Managed Care, Other (non HMO)

## 2022-04-24 ENCOUNTER — Encounter: Payer: Self-pay | Admitting: Podiatry

## 2022-04-24 DIAGNOSIS — Q666 Other congenital valgus deformities of feet: Secondary | ICD-10-CM

## 2022-04-24 DIAGNOSIS — M7662 Achilles tendinitis, left leg: Secondary | ICD-10-CM

## 2022-04-24 DIAGNOSIS — M722 Plantar fascial fibromatosis: Secondary | ICD-10-CM | POA: Diagnosis not present

## 2022-04-24 DIAGNOSIS — M7661 Achilles tendinitis, right leg: Secondary | ICD-10-CM | POA: Diagnosis not present

## 2022-04-24 MED ORDER — METHYLPREDNISOLONE 4 MG PO TBPK: ORAL_TABLET | ORAL | 0 refills | Status: DC

## 2022-04-24 MED ORDER — MELOXICAM 15 MG PO TABS: 15.0000 mg | ORAL_TABLET | Freq: Every day | ORAL | 3 refills | Status: DC

## 2022-04-24 MED ORDER — TRIAMCINOLONE ACETONIDE 40 MG/ML IJ SUSP
40.0000 mg | Freq: Once | INTRAMUSCULAR | Status: AC
  Administered 2022-04-24: 40 mg

## 2022-04-24 NOTE — Progress Notes (Signed)
Subjective:  Patient ID: Philip Cooley, male    DOB: 05/28/1965,  MRN: [id number] HPI Chief Complaint  Patient presents with   Foot Pain    Plantar/posterior heel bilateral - aching x years, AM pain, tried arthritis pain cream, also dorsal foot aching and cramping in toes   New Patient (Initial Visit)    57 y.o. male presents with the above complaint.   ROS: Denies fever chills nausea 5 muscle aches pains calf pain back pain chest pain shortness of breath.  Past Medical History:  Diagnosis Date   ACUTE GINGIVITIS NONPLAQUE INDUCED 09/07/2009   ALLERGIC RHINITIS 06/17/2008   Arthritis    hands and knees   HYPERTENSION 06/17/2008   on meds   Seasonal allergies    Past Surgical History:  Procedure Laterality Date   COLONOSCOPY  2017   MS-MAC-suprep(good0-TA polyp   FINGER FRACTURE SURGERY Right 1997   middle finger   MASS EXCISION Right 08/14/2016   Procedure: EXCISIONAL BIOPSY MASS RIGHT SMALL FINGER;  Surgeon: Leanora Cover, MD;  Location: [facility];  Service: Orthopedics;  Laterality: Right;   POLYPECTOMY  2017   TA   WISDOM TOOTH EXTRACTION      Current Outpatient Medications:    meloxicam (MOBIC) 15 MG tablet, Take 1 tablet (15 mg total) by mouth daily., Disp: 30 tablet, Rfl: 3   methylPREDNISolone (MEDROL DOSEPAK) 4 MG TBPK tablet, 6 day dose pack - take as directed, Disp: 21 tablet, Rfl: 0   amLODipine (NORVASC) 10 MG tablet, TAKE 1 TABLET(10 MG) BY MOUTH DAILY, Disp: 90 tablet, Rfl: 1   fluticasone (FLONASE) 50 MCG/ACT nasal spray, Place 1 spray into both nostrils daily., Disp: 16 g, Rfl: 6   hydrocortisone (ANUSOL-HC) 25 MG suppository, Place 1 suppository (25 mg total) rectally 2 (two) times daily., Disp: 12 suppository, Rfl: 0   loratadine (CLARITIN) 10 MG tablet, Take 10 mg by mouth daily., Disp: , Rfl:    montelukast (SINGULAIR) 10 MG tablet, Take 1 tablet (10 mg total) by mouth at bedtime., Disp: 90 tablet, Rfl: 3   Multiple Vitamin  (MULTIVITAMIN WITH MINERALS) TABS, Take 1 tablet by mouth daily., Disp: , Rfl:   Allergies  Allergen Reactions   Penicillins Swelling   Review of Systems Objective:  There were no vitals filed for this visit.  General: Well developed, nourished, in no acute distress, alert and oriented x3   Dermatological: Skin is warm, dry and supple bilateral. Nails x 10 are well maintained; remaining integument appears unremarkable at this time. There are no open sores, no preulcerative lesions, no rash or signs of infection present.  Vascular: Dorsalis Pedis artery and Posterior Tibial artery pedal pulses are 2/4 bilateral with immedate capillary fill time. Pedal hair growth present. No varicosities and no lower extremity edema present bilateral.   Neruologic: Grossly intact via light touch bilateral. Vibratory intact via tuning fork bilateral. Protective threshold with Semmes Wienstein monofilament intact to all pedal sites bilateral. Patellar and Achilles deep tendon reflexes 2+ bilateral. No Babinski or clonus noted bilateral.   Musculoskeletal: No gross boney pedal deformities bilateral. No pain, crepitus, or limitation noted with foot and ankle range of motion bilateral. Muscular strength 5/5 in all groups tested bilateral.  Pain on palpation medial calcaneal tubercles bilateral.  He has tenderness on palpation of the fourth fifth tarsometatarsal joint of the left foot and tenderness on palpation of the tarsometatarsal joints to bilateral.  Gait: Unassisted, Nonantalgic.    Radiographs:  Radiographs taken today demonstrate  a generally mature individual with pes planovalgus osseously mature with good mineralization.  Soft tissue increase in density at the distalmost aspect of the Achilles bilateral and soft tissue increase in density plantar fascial kidney insertion sites.  Some osteoarthritic changes in the midfoot left greater than right.  Assessment & Plan:   Assessment: Planter fasciitis and  pes planovalgus bilateral.  Osteoarthritis midfoot bilateral.  Plan: Start him on methylprednisolone to be followed by meloxicam.  Injected the bilateral heels today plantarly 20 mg Kenalog 5 mg Marcaine point maximal tenderness.  Discussed appropriate shoe gear stretching exercise ice therapy shoe modifications as dispensed plantar fascia braces bilateral.  And I will follow-up with him in a few weeks.  He probably needs orthotics made.     Hassani Sliney T. [place], [place]

## 2022-05-22 ENCOUNTER — Ambulatory Visit: Payer: Managed Care, Other (non HMO) | Admitting: Podiatry

## 2022-05-22 DIAGNOSIS — Q666 Other congenital valgus deformities of feet: Secondary | ICD-10-CM

## 2022-05-22 NOTE — Progress Notes (Signed)
He presents today for follow-up of his Planter fasciitis bilateral states that his right foot hurts worse than the left states that he is only been to the trash can without his braces 1 time and states that he had pain through his feet along the midfoot.  States that heels are just seem to be doing better though.  Objective: Vital signs are stable alert and oriented x 3.  There is no erythema edema salines drainage odor she has pain on palpation medial calcaneal tubercle right heel.  Assessment: Resolving Planter fasciitis.  Plan: I think at this point continue the meloxicam.  I do think that he needs to consider orthotics which she was casted for today.  I will follow-up with him once those come and may need to put another injection in that time.

## 2022-05-31 ENCOUNTER — Encounter (HOSPITAL_COMMUNITY): Payer: Self-pay

## 2022-05-31 ENCOUNTER — Emergency Department (HOSPITAL_COMMUNITY)
Admission: EM | Admit: 2022-05-31 | Discharge: 2022-05-31 | Disposition: A | Discharge status: Home / Self Care | Payer: Managed Care, Other (non HMO) | Attending: Emergency Medicine | Admitting: Emergency Medicine

## 2022-05-31 ENCOUNTER — Other Ambulatory Visit: Payer: Self-pay

## 2022-05-31 DIAGNOSIS — I10 Essential (primary) hypertension: Secondary | ICD-10-CM | POA: Insufficient documentation

## 2022-05-31 DIAGNOSIS — R112 Nausea with vomiting, unspecified: Secondary | ICD-10-CM | POA: Diagnosis present

## 2022-05-31 DIAGNOSIS — R7989 Other specified abnormal findings of blood chemistry: Secondary | ICD-10-CM | POA: Diagnosis not present

## 2022-05-31 DIAGNOSIS — A084 Viral intestinal infection, unspecified: Secondary | ICD-10-CM | POA: Diagnosis not present

## 2022-05-31 DIAGNOSIS — Z79899 Other long term (current) drug therapy: Secondary | ICD-10-CM | POA: Diagnosis not present

## 2022-05-31 LAB — LIPASE, BLOOD: Lipase: 32 U/L (ref 11–51)

## 2022-05-31 LAB — COMPREHENSIVE METABOLIC PANEL
ALT: 29 U/L (ref 0–44)
AST: 29 U/L (ref 15–41)
Albumin: 3.4 g/dL — ABNORMAL LOW (ref 3.5–5.0)
Alkaline Phosphatase: 71 U/L (ref 38–126)
Anion gap: 13 (ref 5–15)
BUN: 15 mg/dL (ref 6–20)
CO2: 17 mmol/L — ABNORMAL LOW (ref 22–32)
Calcium: 8.7 mg/dL — ABNORMAL LOW (ref 8.9–10.3)
Chloride: 108 mmol/L (ref 98–111)
Creatinine, Ser: 1.28 mg/dL — ABNORMAL HIGH (ref 0.61–1.24)
GFR, Estimated: >60 mL/min (ref >60)
Glucose, Bld: 113 mg/dL — ABNORMAL HIGH (ref 70–99)
Potassium: 4 mmol/L (ref 3.5–5.1)
Sodium: 138 mmol/L (ref 135–145)
Total Bilirubin: 0.5 mg/dL (ref 0.3–1.2)
Total Protein: 7.1 g/dL (ref 6.5–8.1)

## 2022-05-31 LAB — CBC
HCT: 51.3 % (ref 39.0–52.0)
Hemoglobin: 16.8 g/dL (ref 13.0–17.0)
MCH: 27.8 pg (ref 26.0–34.0)
MCHC: 32.7 g/dL (ref 30.0–36.0)
MCV: 84.9 fL (ref 80.0–100.0)
Platelets: 266 10*3/uL (ref 150–400)
RBC: 6.04 MIL/uL — ABNORMAL HIGH (ref 4.22–5.81)
RDW: 14.3 % (ref 11.5–15.5)
WBC: 6.8 10*3/uL (ref 4.0–10.5)
nRBC: 0 % (ref 0.0–0.2)

## 2022-05-31 LAB — URINALYSIS, ROUTINE W REFLEX MICROSCOPIC
Bilirubin Urine: NEGATIVE
Glucose, UA: NEGATIVE mg/dL
Hgb urine dipstick: NEGATIVE
Ketones, ur: NEGATIVE mg/dL
Leukocytes,Ua: NEGATIVE
Nitrite: NEGATIVE
Protein, ur: NEGATIVE mg/dL
Specific Gravity, Urine: 1.017 (ref 1.005–1.030)
pH: 5 (ref 5.0–8.0)

## 2022-05-31 MED ORDER — SODIUM CHLORIDE 0.9 % IV BOLUS
1000.0000 mL | Freq: Once | INTRAVENOUS | Status: AC
  Administered 2022-05-31: 1000 mL via INTRAVENOUS

## 2022-05-31 MED ORDER — ONDANSETRON 4 MG PO TBDP
4.0000 mg | ORAL_TABLET | Freq: Once | ORAL | Status: AC
  Administered 2022-05-31: 4 mg via ORAL
  Filled 2022-05-31: qty 1

## 2022-05-31 MED ORDER — ONDANSETRON HCL 4 MG PO TABS: 4.0000 mg | ORAL_TABLET | Freq: Four times a day (QID) | ORAL | 0 refills | Status: DC

## 2022-05-31 MED ORDER — DICYCLOMINE HCL 10 MG PO CAPS
10.0000 mg | ORAL_CAPSULE | Freq: Once | ORAL | Status: AC
  Administered 2022-05-31: 10 mg via ORAL
  Filled 2022-05-31: qty 1

## 2022-05-31 MED ORDER — DICYCLOMINE HCL 20 MG PO TABS: 20.0000 mg | ORAL_TABLET | Freq: Two times a day (BID) | ORAL | 0 refills | Status: AC

## 2022-05-31 NOTE — Discharge Instructions (Signed)
You were seen in the emergency department today for nausea, vomiting and diarrhea.  You likely have a viral gastroenteritis.  I have prescribed you medications including Zofran to help with nausea as well as dicyclomine or Bentyl which can help with abdominal cramping.  Please eat a gentle diet and drink plenty of fluids.  Please return to emergency department for worsening abdominal pain or abdominal pain with fever.

## 2022-05-31 NOTE — ED Triage Notes (Signed)
Pt arrives via POV. Pt reports nausea, vomiting, and diarrhea that started on Tuesday. Pt states the vomiting has resolved but still has diarrhea. Reports some abdominal cramping.

## 2022-05-31 NOTE — ED Provider Notes (Signed)
[facility] Provider Note   CSN: [id number] Arrival date & time: 05/31/22  [id number]     History  Chief Complaint  Patient presents with   Nausea   Diarrhea    Philip Cooley is a 57 y.o. male.  With past medical history of hypertension presents to the emergency department with nausea and diarrhea.  Patient states that symptoms began on Tuesday.  He states that he was having nausea, vomiting and diarrhea on Tuesday.  He states that the vomiting stopped on Tuesday but has persistently had diarrhea since then.  He states the diarrhea has worsened and today is gone 7 times since 2 AM.  He describes it as watery and nonbloody.  He is also having abdominal cramping just prior to having to have diarrhea.  He denies abdominal pain otherwise.  He denies having fevers or dysuria.  He denies having possibly contaminated foods, sick contacts.  Denies recent travel.  Denies recent antibiotic use.  No previous abdominal surgeries.   Diarrhea Associated symptoms: vomiting        Home Medications Prior to Admission medications   Medication Sig Start Date End Date Taking? Authorizing Provider  dicyclomine (BENTYL) 20 MG tablet Take 1 tablet (20 mg total) by mouth 2 (two) times daily. 05/31/22  Yes Mickie Hillier, PA-C  ondansetron (ZOFRAN) 4 MG tablet Take 1 tablet (4 mg total) by mouth every 6 (six) hours. 05/31/22  Yes Mickie Hillier, PA-C  amLODipine (NORVASC) 10 MG tablet TAKE 1 TABLET(10 MG) BY MOUTH DAILY 12/29/21   Billie Ruddy, MD  fluticasone (FLONASE) 50 MCG/ACT nasal spray Place 1 spray into both nostrils daily. 01/17/22   Billie Ruddy, MD  hydrocortisone (ANUSOL-HC) 25 MG suppository Place 1 suppository (25 mg total) rectally 2 (two) times daily. 05/22/21   Billie Ruddy, MD  loratadine (CLARITIN) 10 MG tablet Take 10 mg by mouth daily.    [provider]  meloxicam (MOBIC) 15 MG tablet Take 1 tablet (15 mg total) by mouth daily.  04/24/22   Hyatt, Max T, DPM  methylPREDNISolone (MEDROL DOSEPAK) 4 MG TBPK tablet 6 day dose pack - take as directed 04/24/22   Hyatt, Max T, DPM  montelukast (SINGULAIR) 10 MG tablet Take 1 tablet (10 mg total) by mouth at bedtime. 03/08/22   Billie Ruddy, MD  Multiple Vitamin (MULTIVITAMIN WITH MINERALS) TABS Take 1 tablet by mouth daily.    [provider]      Allergies    Penicillins    Review of Systems   Review of Systems  Gastrointestinal:  Positive for diarrhea, nausea and vomiting.  All other systems reviewed and are negative.   Physical Exam Updated Vital Signs BP 125/86 (BP Location: Right Arm)   Pulse (!) 55   Temp 98 F (36.7 C) (Oral)   Resp 17   Ht '5\' 5"'$  (1.651 m)   Wt 90.7 kg   SpO2 99%   BMI 33.28 kg/m  Physical Exam Vitals and nursing note reviewed.  Constitutional:      General: He is not in acute distress.    Appearance: Normal appearance. He is not ill-appearing or toxic-appearing.  HENT:     Head: Normocephalic and atraumatic.     Mouth/Throat:     Mouth: Mucous membranes are moist.     Pharynx: Oropharynx is clear.  Eyes:     General: No scleral icterus.    Extraocular Movements: Extraocular movements intact.  Cardiovascular:     Rate and Rhythm: Normal rate and regular rhythm.     Pulses: Normal pulses.  Pulmonary:     Effort: Pulmonary effort is normal. No respiratory distress.     Breath sounds: Normal breath sounds.  Abdominal:     General: Abdomen is protuberant. Bowel sounds are normal. There is no distension.     Palpations: Abdomen is soft.     Tenderness: There is no abdominal tenderness. There is no guarding.  Musculoskeletal:        General: Normal range of motion.  Skin:    General: Skin is warm and dry.     Findings: No rash.  Neurological:     General: No focal deficit present.     Mental Status: He is alert and oriented to person, place, and time. Mental status is at baseline.  Psychiatric:        Mood and  Affect: Mood normal.        Behavior: Behavior normal.        Thought Content: Thought content normal.        Judgment: Judgment normal.     ED Results / Procedures / Treatments   Labs (all labs ordered are listed, but only abnormal results are displayed) Labs Reviewed  COMPREHENSIVE METABOLIC PANEL - Abnormal; Notable for the following components:      Result Value   CO2 17 (*)    Glucose, Bld 113 (*)    Creatinine, Ser 1.28 (*)    Calcium 8.7 (*)    Albumin 3.4 (*)    All other components within normal limits  CBC - Abnormal; Notable for the following components:   RBC 6.04 (*)    All other components within normal limits  LIPASE, BLOOD  URINALYSIS, ROUTINE W REFLEX MICROSCOPIC    EKG None  Radiology No results found.  Procedures Procedures   Medications Ordered in ED Medications  ondansetron (ZOFRAN-ODT) disintegrating tablet 4 mg (4 mg Oral Given 05/31/22 1041)  dicyclomine (BENTYL) capsule 10 mg (10 mg Oral Given 05/31/22 1042)  sodium chloride 0.9 % bolus 1,000 mL (0 mLs Intravenous Stopped 05/31/22 1235)    ED Course/ Medical Decision Making/ A&P   {    Medical Decision Making Amount and/or Complexity of Data Reviewed Labs: ordered.  Risk Prescription drug management.  Initial Impression and Ddx 57 year old male who presents to the emergency department with diarrhea, nausea and vomiting since Tuesday. Patient PMH that increases complexity of ED encounter: Hypertension  Interpretation of Diagnostics I independent reviewed and interpreted the labs as followed: CBC within normal limits.  Creatinine very mildly elevated without AKI, bicarb mildly decreased, is consistent with vomiting and diarrhea.  Lipase is negative, UA is negative.  - I independently visualized the following imaging with scope of interpretation limited to determining acute life threatening conditions related to emergency care: Not indicated  Patient Reassessment and Ultimate  Disposition/Management 57 year old male who presents with nausea, vomiting and diarrhea for 2 days. He is overall well-appearing, nonseptic and nontoxic in appearance.  He is hemodynamically stable without fever.  He has no abdominal tenderness to palpation and a nonperitoneal neck abdomen.  Will obtain screening labs and provide him with IV fluids, Zofran and Bentyl.  Labs are reassuring.  He does have mild increase in his creatinine and bicarb is mildly decreased, consistent with dehydration from his diarrhea and vomiting.  He was provided with a liter IV fluids, Zofran and Bentyl with improvement in his symptoms.  UA is negative so doubt UTI, stone, strep stone, infected stone.  His lipase is negative so doubt acute pancreatitis.  There is no transaminitis or elevated total bili concerning for acute hepatobiliary disease.  No severe electrolyte dysfunction.  There is no leukocytosis or fever concerning for intra-abdominal infection.  He likely has a viral gastroenteritis.  Did not feel that he needs CT imaging at this time.  Will discharge home with Zofran and Bentyl, gentle diet, strict return precautions for fever or worsening abdominal pain.  He verbalized understanding is agreeable to plan.  The patient has been appropriately medically screened and/or stabilized in the ED. I have low suspicion for any other emergent medical condition which would require further screening, evaluation or treatment in the ED or require inpatient management. At time of discharge the patient is hemodynamically stable and in no acute distress. I have discussed work-up results and diagnosis with patient and answered all questions. Patient is agreeable with discharge plan. We discussed strict return precautions for returning to the emergency department and they verbalized understanding.     Patient management required discussion with the following services or consulting groups:  None  Complexity of Problems  Addressed Acute complicated illness or Injury  Additional Data Reviewed and Analyzed Further history obtained from: Past medical history and medications listed in the EMR, Recent Consult notes, Care Everywhere, and Prior labs/imaging results  Patient Encounter Risk Assessment Prescriptions  Final Clinical Impression(s) / ED Diagnoses Final diagnoses:  Viral gastroenteritis    Rx / DC Orders ED Discharge Orders          Ordered    ondansetron (ZOFRAN) 4 MG tablet  Every 6 hours        05/31/22 1410    dicyclomine (BENTYL) 20 MG tablet  2 times daily        05/31/22 1410              Mickie Hillier, PA-C 05/31/22 1413    Lajean Saver, MD 05/31/22 1520

## 2022-06-01 ENCOUNTER — Ambulatory Visit: Payer: Managed Care, Other (non HMO) | Admitting: Family Medicine

## 2022-06-28 ENCOUNTER — Ambulatory Visit (HOSPITAL_COMMUNITY)
Admission: EM | Admit: 2022-06-28 | Discharge: 2022-06-28 | Disposition: A | Discharge status: Home / Self Care | Payer: Managed Care, Other (non HMO) | Attending: Family Medicine | Admitting: Family Medicine

## 2022-06-28 ENCOUNTER — Encounter (HOSPITAL_COMMUNITY): Payer: Self-pay

## 2022-06-28 DIAGNOSIS — J301 Allergic rhinitis due to pollen: Secondary | ICD-10-CM

## 2022-06-28 MED ORDER — PREDNISONE 20 MG PO TABS: 40.0000 mg | ORAL_TABLET | Freq: Every day | ORAL | 0 refills | Status: AC

## 2022-06-28 MED ORDER — TRIAMCINOLONE ACETONIDE 40 MG/ML IJ SUSP
40.0000 mg | Freq: Once | INTRAMUSCULAR | Status: AC
  Administered 2022-06-28: 40 mg via INTRAMUSCULAR

## 2022-06-28 MED ORDER — TRIAMCINOLONE ACETONIDE 40 MG/ML IJ SUSP
INTRAMUSCULAR | Status: AC
  Filled 2022-06-28: qty 1

## 2022-06-28 NOTE — ED Triage Notes (Signed)
Pt reports nasal congestion and cough for several days. Pt thinks it could be allergies. Pt reports taking Claritin with no relief.

## 2022-06-28 NOTE — ED Provider Notes (Signed)
[place]    CSN: [id number] Arrival date & time: 06/28/22  1158      History   Chief Complaint Chief Complaint  Patient presents with   Cough   Nasal Congestion    HPI Philip Cooley is a 57 y.o. male.    Cough  Here for sneezing.  He has nasal congestion after he sneezes.  Has just minimal cough. He notes some sore throat but that improves with some hydration.  No fever or chills.  No malaise or myalgia.  The symptoms began about March 29.  He is already taking Singulair and Claritin and using Flonase.  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   Chronic cough 06/27/2021   Polycythemia 02/09/2020   History of back strain 12/19/2018   ACUTE GINGIVITIS NONPLAQUE INDUCED 09/07/2009   Essential hypertension 06/17/2008   Allergic rhinitis 06/17/2008    Past Surgical History:  Procedure Laterality Date   COLONOSCOPY  2017   MS-MAC-suprep(good0-TA polyp   FINGER FRACTURE SURGERY Right 1997   middle finger   MASS EXCISION Right 08/14/2016   Procedure: EXCISIONAL BIOPSY MASS RIGHT SMALL FINGER;  Surgeon: Leanora Cover, MD;  Location: [facility];  Service: Orthopedics;  Laterality: Right;   POLYPECTOMY  2017   TA   WISDOM TOOTH EXTRACTION         Home Medications    Prior to Admission medications   Medication Sig Start Date End Date Taking? Authorizing Provider  amLODipine (NORVASC) 10 MG tablet TAKE 1 TABLET(10 MG) BY MOUTH DAILY 12/29/21  Yes Billie Ruddy, MD  dicyclomine (BENTYL) 20 MG tablet Take 1 tablet (20 mg total) by mouth 2 (two) times daily. 05/31/22  Yes Mickie Hillier, PA-C  loratadine (CLARITIN) 10 MG tablet Take 10 mg by mouth daily.   Yes [provider]  montelukast (SINGULAIR) 10 MG tablet Take 1 tablet (10 mg total) by mouth at bedtime. 03/08/22  Yes Billie Ruddy, MD  predniSONE (DELTASONE) 20 MG tablet Take 2 tablets (40 mg total) by mouth daily with breakfast for 3 days. 06/28/22 07/01/22 Yes Renell Coaxum, Gwenlyn Perking, MD  fluticasone (FLONASE) 50 MCG/ACT nasal spray Place 1 spray into both nostrils daily. 01/17/22   Billie Ruddy, MD  hydrocortisone (ANUSOL-HC) 25 MG suppository Place 1 suppository (25 mg total) rectally 2 (two) times daily. 05/22/21   Billie Ruddy, MD  meloxicam (MOBIC) 15 MG tablet Take 1 tablet (15 mg total) by mouth daily. 04/24/22   Hyatt, Max T, DPM  methylPREDNISolone (MEDROL DOSEPAK) 4 MG TBPK tablet 6 day dose pack - take as directed 04/24/22   Hyatt, Max T, DPM  Multiple Vitamin (MULTIVITAMIN WITH MINERALS) TABS Take 1 tablet by mouth daily.    [provider]  ondansetron (ZOFRAN) 4 MG tablet Take 1 tablet (4 mg total) by mouth every 6 (six) hours. 05/31/22   Mickie Hillier, PA-C    Family History Family History  Problem Relation Age of Onset   Diabetes Mother    Cancer Mother    Colon cancer Neg Hx  Colon polyps Neg Hx    Esophageal cancer Neg Hx    Rectal cancer Neg Hx    Stomach cancer Neg Hx     Social History Social History   Tobacco Use   Smoking status: Never   Smokeless tobacco: Never  Vaping Use   Vaping Use: Never used  Substance Use Topics   Alcohol use: No   Drug use: No     Allergies   Penicillins   Review of Systems Review of Systems  Respiratory:  Positive for cough.      Physical Exam Triage Vital Signs ED Triage Vitals [06/28/22 1219]  Enc Vitals Group     BP (!) 149/92     Pulse Rate 87     Resp 18     Temp 98.7 F (37.1 C)     Temp Source Oral     SpO2 99 %     Weight      Height      Head Circumference      Peak Flow      Pain Score       Pain Loc      Pain Edu?      Excl. in [place]?    No data found.  Updated Vital Signs BP (!) 149/92 (BP Location: Left Arm)   Pulse 87   Temp 98.7 F (37.1 C) (Oral)   Resp 18   SpO2 99%   Visual Acuity Right Eye Distance:   Left Eye Distance:   Bilateral Distance:    Right Eye Near:   Left Eye Near:    Bilateral Near:     Physical Exam Vitals reviewed.  Constitutional:      General: He is not in acute distress.    Appearance: He is not toxic-appearing.  HENT:     Right Ear: Tympanic membrane and ear canal normal.     Left Ear: Tympanic membrane and ear canal normal.     Nose: Nose normal.     Mouth/Throat:     Mouth: Mucous membranes are moist.     Pharynx: No posterior oropharyngeal erythema.     Comments: Clear mucus; no erythema Eyes:     Extraocular Movements: Extraocular movements intact.     Conjunctiva/sclera: Conjunctivae normal.     Pupils: Pupils are equal, round, and reactive to light.  Cardiovascular:     Rate and Rhythm: Normal rate and regular rhythm.     Heart sounds: No murmur heard. Pulmonary:     Effort: Pulmonary effort is normal. No respiratory distress.     Breath sounds: Normal breath sounds. No stridor. No wheezing, rhonchi or rales.  Musculoskeletal:     Cervical back: Neck supple.  Lymphadenopathy:     Cervical: No cervical adenopathy.  Skin:    Capillary Refill: Capillary refill takes less than 2 seconds.     Coloration: Skin is not jaundiced or pale.  Neurological:     General: No focal deficit present.     Mental Status: He is alert and oriented to person, place, and time.  Psychiatric:        Behavior: Behavior normal.      UC Treatments / Results  Labs (all labs ordered are listed, but only abnormal results are displayed) Labs Reviewed - No data to display  EKG   Radiology No results found.  Procedures Procedures (including critical care time)  Medications Ordered in UC Medications  triamcinolone acetonide  (KENALOG-40) injection 40 mg (has no administration  in time range)    Initial Impression / Assessment and Plan / UC Course  I have reviewed the triage vital signs and the nursing notes.  Pertinent labs & imaging results that were available during my care of the patient were reviewed by me and considered in my medical decision making (see chart for details).        He is given a steroid injection and 3 days of prednisone prescription.  I am recommending that he try taking Zyrtec as it might be more effective than some of the other nonsedating antihistamines. Final Clinical Impressions(s) / UC Diagnoses   Final diagnoses:  Seasonal allergic rhinitis due to pollen     Discharge Instructions      You have been given a shot of triamcinolone 40 mg, a steroid injection.  Take prednisone 20 mg--2 daily for 3 days   Try taking Zyrtec/cetirizine 10 mg--daily for allergies, instead of the Claritin      ED Prescriptions     Medication Sig Dispense Auth. Provider   predniSONE (DELTASONE) 20 MG tablet Take 2 tablets (40 mg total) by mouth daily with breakfast for 3 days. 6 tablet Windy Carina Gwenlyn Perking, MD      PDMP not reviewed this encounter.   Barrett Henle, MD 06/28/22 1239

## 2022-06-28 NOTE — Discharge Instructions (Signed)
You have been given a shot of triamcinolone 40 mg, a steroid injection.  Take prednisone 20 mg--2 daily for 3 days   Try taking Zyrtec/cetirizine 10 mg--daily for allergies, instead of the Claritin

## 2022-07-04 ENCOUNTER — Ambulatory Visit (INDEPENDENT_AMBULATORY_CARE_PROVIDER_SITE_OTHER): Payer: Managed Care, Other (non HMO) | Admitting: Podiatry

## 2022-07-04 DIAGNOSIS — Q666 Other congenital valgus deformities of feet: Secondary | ICD-10-CM

## 2022-07-04 DIAGNOSIS — M722 Plantar fascial fibromatosis: Secondary | ICD-10-CM

## 2022-07-04 NOTE — Progress Notes (Signed)
Patient presents today to pick up custom molded foot orthotics recommended by Dr. HYATT.   Orthotics were dispensed and fit was satisfactory. Reviewed instructions for break-in and wear. Written instructions given to patient.  Patient will follow up as needed.     

## 2022-07-27 ENCOUNTER — Encounter (HOSPITAL_COMMUNITY): Payer: Self-pay | Admitting: Emergency Medicine

## 2022-07-27 ENCOUNTER — Ambulatory Visit (HOSPITAL_COMMUNITY)
Admission: EM | Admit: 2022-07-27 | Discharge: 2022-07-27 | Disposition: A | Discharge status: Home / Self Care | Payer: Managed Care, Other (non HMO) | Attending: Emergency Medicine | Admitting: Emergency Medicine

## 2022-07-27 DIAGNOSIS — R21 Rash and other nonspecific skin eruption: Secondary | ICD-10-CM | POA: Diagnosis not present

## 2022-07-27 MED ORDER — TRIAMCINOLONE ACETONIDE 0.1 % EX CREA: 1.0000 | TOPICAL_CREAM | Freq: Two times a day (BID) | CUTANEOUS | 0 refills | Status: DC

## 2022-07-27 NOTE — ED Provider Notes (Signed)
[facility]    CSN: [id number] Arrival date & time: 07/27/22  1215      History   Chief Complaint Chief Complaint  Patient presents with   Rash    HPI Philip Cooley is a 57 y.o. male.   Patient presents to clinic for concerns of a rash to his bilateral lower legs.  Rash is scant spots to his legs that are itchy.  He has not tried anything for the rash.  He denies any known allergens.  Has been using the same soap for 3 years.  Denies any spreading of the rash.  Reports his throat has been dry.  Has been taking the daily antihistamine, thinks this is drying him out.  Denies sore throat, ear pain, cough, nasal congestion or itchy eyes.  Reports all allergic symptoms have improved on daily antihistamine.  The history is provided by the patient and medical records.  Rash Associated symptoms: sore throat   Associated symptoms: no fatigue, no fever and no shortness of breath     Past Medical History:  Diagnosis Date   ACUTE GINGIVITIS NONPLAQUE INDUCED 09/07/2009   ALLERGIC RHINITIS 06/17/2008   Arthritis    hands and knees   HYPERTENSION 06/17/2008   on meds   Seasonal allergies     Patient Active Problem List   Diagnosis Date Noted   Ocular migraine 03/28/2022   Seasonal allergies 03/28/2022   Bilateral leg numbness 03/28/2022   Langerhan's cell histiocytosis (HCC) 07/24/2021   Chronic cough 06/27/2021   Polycythemia 02/09/2020   History of back strain 12/19/2018   ACUTE GINGIVITIS NONPLAQUE INDUCED 09/07/2009   Essential hypertension 06/17/2008   Allergic rhinitis 06/17/2008    Past Surgical History:  Procedure Laterality Date   COLONOSCOPY  2017   MS-MAC-suprep(good0-TA polyp   FINGER FRACTURE SURGERY Right 1997   middle finger   MASS EXCISION Right 08/14/2016   Procedure: EXCISIONAL BIOPSY MASS RIGHT SMALL FINGER;  Surgeon: Betha Loa, MD;  Location: [facility];  Service: Orthopedics;  Laterality: Right;   POLYPECTOMY  2017   TA    WISDOM TOOTH EXTRACTION         Home Medications    Prior to Admission medications   Medication Sig Start Date End Date Taking? Authorizing Provider  triamcinolone cream (KENALOG) 0.1 % Apply 1 Application topically 2 (two) times daily. 07/27/22  Yes Rinaldo Ratel, [place] N, FNP  amLODipine (NORVASC) 10 MG tablet TAKE 1 TABLET(10 MG) BY MOUTH DAILY 12/29/21   Deeann Saint, MD  dicyclomine (BENTYL) 20 MG tablet Take 1 tablet (20 mg total) by mouth 2 (two) times daily. 05/31/22   Cristopher Peru, PA-C  fluticasone (FLONASE) 50 MCG/ACT nasal spray Place 1 spray into both nostrils daily. 01/17/22   Deeann Saint, MD  hydrocortisone (ANUSOL-HC) 25 MG suppository Place 1 suppository (25 mg total) rectally 2 (two) times daily. 05/22/21   Deeann Saint, MD  loratadine (CLARITIN) 10 MG tablet Take 10 mg by mouth daily.    [provider]  meloxicam (MOBIC) 15 MG tablet Take 1 tablet (15 mg total) by mouth daily. 04/24/22   Hy[facility], Max T, DPM  Multiple Vitamin (MULTIVITAMIN WITH MINERALS) TABS Take 1 tablet by mouth daily.    [provider]    Family History Family History  Problem Relation Age of Onset   Diabetes Mother    Cancer Mother    Colon cancer Neg Hx    Colon polyps Neg Hx  Esophageal cancer Neg Hx    Rectal cancer Neg Hx    Stomach cancer Neg Hx     Social History Social History   Tobacco Use   Smoking status: Never   Smokeless tobacco: Never  Vaping Use   Vaping Use: Never used  Substance Use Topics   Alcohol use: No   Drug use: No     Allergies   Penicillins   Review of Systems Review of Systems  Constitutional:  Negative for chills, fatigue and fever.  HENT:  Positive for sore throat. Negative for congestion.   Eyes:  Negative for discharge and itching.  Respiratory:  Negative for cough and shortness of breath.   Cardiovascular:  Negative for chest pain.  Musculoskeletal:  Negative for back pain.  Skin:  Positive for rash.      Physical Exam Triage Vital Signs ED Triage Vitals  Enc Vitals Group     BP 07/27/22 1248 (!) 148/96     Pulse Rate 07/27/22 1248 87     Resp 07/27/22 1248 16     Temp 07/27/22 1248 98.4 F (36.9 C)     Temp Source 07/27/22 1248 Oral     SpO2 07/27/22 1248 97 %     Weight --      Height --      Head Circumference --      Peak Flow --      Pain Score 07/27/22 1247 0     Pain Loc --      Pain Edu? --      Excl. in [place]? --    No data found.  Updated Vital Signs BP (!) 148/96 (BP Location: Left Arm)   Pulse 87   Temp 98.4 F (36.9 C) (Oral)   Resp 16   SpO2 97%   Visual Acuity Right Eye Distance:   Left Eye Distance:   Bilateral Distance:    Right Eye Near:   Left Eye Near:    Bilateral Near:     Physical Exam Vitals and nursing note reviewed.  Constitutional:      Appearance: Normal appearance.  HENT:     Head: Normocephalic and atraumatic.     Right Ear: Tympanic membrane, ear canal and external ear normal.     Left Ear: Tympanic membrane, ear canal and external ear normal.     Nose: Nose normal.     Mouth/Throat:     Mouth: Mucous membranes are moist.     Pharynx: Posterior oropharyngeal erythema present.  Eyes:     General: No scleral icterus.    Conjunctiva/sclera: Conjunctivae normal.  Cardiovascular:     Rate and Rhythm: Normal rate and regular rhythm.  Pulmonary:     Effort: Pulmonary effort is normal. No respiratory distress.  Musculoskeletal:        General: No swelling or tenderness. Normal range of motion.     Cervical back: Normal range of motion.  Skin:    General: Skin is warm and dry.     Findings: Rash present. Rash is urticarial.     Comments: Scattered papular urticarial rash to his bilateral lower legs.  Neurological:     General: No focal deficit present.     Mental Status: He is alert and oriented to person, place, and time.  Psychiatric:        Mood and Affect: Mood normal.        Behavior: Behavior normal. Behavior is  cooperative.      UC Treatments /  Results  Labs (all labs ordered are listed, but only abnormal results are displayed) Labs Reviewed - No data to display  EKG   Radiology No results found.  Procedures Procedures (including critical care time)  Medications Ordered in UC Medications - No data to display  Initial Impression / Assessment and Plan / UC Course  I have reviewed the triage vital signs and the nursing notes.  Pertinent labs & imaging results that were available during my care of the patient were reviewed by me and considered in my medical decision making (see chart for details).  Vitals and triage reviewed, pain patient is hemodynamically stable.  Scattered urticarial rash to his lower extremities.  Patient reports he thinks it is heat rash.  Denies any history of poison oak exposure.  Will trial triamcinolone cream.  Was recently on oral steroids, rashes not systemic, will withhold IM and oral steroids.  Encouraged to sleep with a humidifier for dry mouth, warm saline gargles and tea with honey.  Return and follow-up precautions reviewed, patient verbalized understanding, no questions at this time.  Work note provided.     Final Clinical Impressions(s) / UC Diagnoses   Final diagnoses:  Rash and nonspecific skin eruption     Discharge Instructions      Please use the triamcinolone cream to the rash twice daily.  Try to refrain from itching, as this can cause aches in the skin and lead to skin infection.  You can sleep with a humidifier at night to help moisturize the area, you can do warm saline gargles and drink tea with warm honey for your sore throat.  Please ensure you drink at least 64 ounces of water, the antihistamine you are taking can cause dryness.  Please return to clinic or follow-up with your primary care if you have any new or concerning symptoms.      ED Prescriptions     Medication Sig Dispense Auth. Provider   triamcinolone cream (KENALOG)  0.1 % Apply 1 Application topically 2 (two) times daily. 30 g [employer], [place], [place]      PDMP not reviewed this encounter.   Servando Kyllonen, [place] N, [place] 07/27/22 1316

## 2022-07-27 NOTE — ED Triage Notes (Signed)
Pt having red spots on bilateral knees and lower for week. Reports itching. Hasn't taken medications

## 2022-07-27 NOTE — Discharge Instructions (Addendum)
Please use the triamcinolone cream to the rash twice daily.  Try to refrain from itching, as this can cause aches in the skin and lead to skin infection.  You can sleep with a humidifier at night to help moisturize the area, you can do warm saline gargles and drink tea with warm honey for your sore throat.  Please ensure you drink at least 64 ounces of water, the antihistamine you are taking can cause dryness.  Please return to clinic or follow-up with your primary care if you have any new or concerning symptoms.

## 2022-08-14 ENCOUNTER — Other Ambulatory Visit: Payer: Self-pay | Admitting: Podiatry

## 2022-10-31 ENCOUNTER — Ambulatory Visit: Payer: Managed Care, Other (non HMO) | Admitting: Family Medicine

## 2022-10-31 DIAGNOSIS — I10 Essential (primary) hypertension: Secondary | ICD-10-CM | POA: Diagnosis not present

## 2022-10-31 MED ORDER — AMLODIPINE BESYLATE 10 MG PO TABS: 10.0000 mg | ORAL_TABLET | Freq: Every day | ORAL | 3 refills | Status: DC

## 2022-10-31 NOTE — Progress Notes (Signed)
Established Patient Office Visit   Subjective  Patient ID: Philip Cooley, male    DOB: Jan 13, 1966  Age: 57 y.o. MRN: [id number]  Chief Complaint  Patient presents with   Medical Management of Chronic Issues    BP    Patient is a 57 year old male seen for medication refill.  Patient for refills.  Sequesting refill on Norvasc 10 mg daily.  States was told he needed to make an appointment for refills.  BP has been well-controlled on medication.  Patient denies any issues including CP, headache, dizziness, LE edema.    Past Medical History:  Diagnosis Date   ACUTE GINGIVITIS NONPLAQUE INDUCED 09/07/2009   ALLERGIC RHINITIS 06/17/2008   Arthritis    hands and knees   HYPERTENSION 06/17/2008   on meds   Seasonal allergies    Past Surgical History:  Procedure Laterality Date   COLONOSCOPY  2017   MS-MAC-suprep(good0-TA polyp   FINGER FRACTURE SURGERY Right 1997   middle finger   MASS EXCISION Right 08/14/2016   Procedure: EXCISIONAL BIOPSY MASS RIGHT SMALL FINGER;  Surgeon: Betha Loa, MD;  Location: [facility];  Service: Orthopedics;  Laterality: Right;   POLYPECTOMY  2017   TA   WISDOM TOOTH EXTRACTION     Social History   Tobacco Use   Smoking status: Never   Smokeless tobacco: Never  Vaping Use   Vaping status: Never Used  Substance Use Topics   Alcohol use: No   Drug use: No   Family History  Problem Relation Age of Onset   Diabetes Mother    Cancer Mother    Colon cancer Neg Hx    Colon polyps Neg Hx    Esophageal cancer Neg Hx    Rectal cancer Neg Hx    Stomach cancer Neg Hx    Allergies  Allergen Reactions   Penicillins Swelling      ROS Negative unless stated above    Objective:     BP 128/82 (BP Location: Right Arm, Patient Position: Sitting, Cuff Size: Large)   Pulse 73   Temp 98.8 F (37.1 C) (Oral)   Wt 200 lb 9.6 oz (91 kg)   SpO2 97%   BMI 33.38 kg/m  BP Readings from Last 3 Encounters:  10/31/22 128/82   07/27/22 (!) 148/96  06/28/22 (!) 149/92   Wt Readings from Last 3 Encounters:  10/31/22 200 lb 9.6 oz (91 kg)  05/31/22 200 lb (90.7 kg)  04/18/22 205 lb 9.6 oz (93.3 kg)      Physical Exam Constitutional:      General: He is not in acute distress.    Appearance: Normal appearance.  HENT:     Head: Normocephalic and atraumatic.     Nose: Nose normal.     Mouth/Throat:     Mouth: Mucous membranes are moist.  Cardiovascular:     Rate and Rhythm: Normal rate and regular rhythm.     Heart sounds: Normal heart sounds. No murmur heard.    No gallop.  Pulmonary:     Effort: Pulmonary effort is normal. No respiratory distress.     Breath sounds: Normal breath sounds. No wheezing, rhonchi or rales.  Skin:    General: Skin is warm and dry.  Neurological:     Mental Status: He is alert and oriented to person, place, and time.      No results found for any visits on 10/31/22.    Assessment & Plan:  Essential hypertension -  amLODIPine Besylate; Take 1 tablet (10 mg total) by mouth daily.  Dispense: 90 tablet; Refill: 3  BP controlled on Norvasc 10 mg daily.  Medication refilled.  Continue lifestyle modifications.  Follow-up in the next 6 months as needed.  No follow-ups on file.   Deeann Saint, MD

## 2022-11-03 ENCOUNTER — Encounter: Payer: Self-pay | Admitting: Family Medicine

## 2022-11-16 ENCOUNTER — Telehealth: Payer: Self-pay | Admitting: Family Medicine

## 2022-11-16 NOTE — Telephone Encounter (Signed)
Pt went to UC for skin issue, was prescribed triamcinolone cream (KENALOG) 0.1 % and requesting refill.   [id number] [place],  - [phone] AT [facility] OF [facility] & [address] Phone: [phone]  Fax: [phone]

## 2022-11-16 NOTE — Telephone Encounter (Signed)
Patient is aware that [place] Garrison would need to Refill that mediation or be seen by Dr. Salomon Fick.

## 2022-12-12 ENCOUNTER — Encounter: Payer: Self-pay | Admitting: Internal Medicine

## 2022-12-12 ENCOUNTER — Ambulatory Visit: Payer: Managed Care, Other (non HMO) | Admitting: Internal Medicine

## 2022-12-12 VITALS — BP 120/84 | HR 82 | Temp 98.6°F | Wt 196.3 lb

## 2022-12-12 DIAGNOSIS — L600 Ingrowing nail: Secondary | ICD-10-CM | POA: Diagnosis not present

## 2022-12-12 DIAGNOSIS — L03031 Cellulitis of right toe: Secondary | ICD-10-CM

## 2022-12-12 MED ORDER — DOXYCYCLINE HYCLATE 100 MG PO TABS: 100.0000 mg | ORAL_TABLET | Freq: Two times a day (BID) | ORAL | 0 refills | Status: AC

## 2022-12-12 NOTE — Progress Notes (Signed)
Established Patient Office Visit     CC/Reason for Visit: Right great toe pain and swelling  HPI: Philip Cooley is a 57 y.o. male who is coming in today for the above mentioned reasons.  He has a history of ingrown toenails.  This current episode of his right great toenail started bothering him about 3 days ago.  Past Medical/Surgical History: Past Medical History:  Diagnosis Date   ACUTE GINGIVITIS NONPLAQUE INDUCED 09/07/2009   ALLERGIC RHINITIS 06/17/2008   Arthritis    hands and knees   HYPERTENSION 06/17/2008   on meds   Seasonal allergies     Past Surgical History:  Procedure Laterality Date   COLONOSCOPY  2017   MS-MAC-suprep(good0-TA polyp   FINGER FRACTURE SURGERY Right 1997   middle finger   MASS EXCISION Right 08/14/2016   Procedure: EXCISIONAL BIOPSY MASS RIGHT SMALL FINGER;  Surgeon: Betha Loa, MD;  Location: [facility];  Service: Orthopedics;  Laterality: Right;   POLYPECTOMY  2017   TA   WISDOM TOOTH EXTRACTION      Social History:  reports that he has never smoked. He has never used smokeless tobacco. He reports that he does not drink alcohol and does not use drugs.  Allergies: Allergies  Allergen Reactions   Penicillins Swelling    Family History:  Family History  Problem Relation Age of Onset   Diabetes Mother    Cancer Mother    Colon cancer Neg Hx    Colon polyps Neg Hx    Esophageal cancer Neg Hx    Rectal cancer Neg Hx    Stomach cancer Neg Hx      Current Outpatient Medications:    amLODipine (NORVASC) 10 MG tablet, Take 1 tablet (10 mg total) by mouth daily., Disp: 90 tablet, Rfl: 3   cetirizine (ZYRTEC) 10 MG tablet, Take 10 mg by mouth daily., Disp: , Rfl:    dicyclomine (BENTYL) 20 MG tablet, Take 1 tablet (20 mg total) by mouth 2 (two) times daily., Disp: 20 tablet, Rfl: 0   doxycycline (VIBRA-TABS) 100 MG tablet, Take 1 tablet (100 mg total) by mouth 2 (two) times daily for 7 days., Disp: 14 tablet, Rfl:  0   fluticasone (FLONASE) 50 MCG/ACT nasal spray, Place 1 spray into both nostrils daily., Disp: 16 g, Rfl: 6   hydrocortisone (ANUSOL-HC) 25 MG suppository, Place 1 suppository (25 mg total) rectally 2 (two) times daily., Disp: 12 suppository, Rfl: 0   meloxicam (MOBIC) 15 MG tablet, TAKE 1 TABLET(15 MG) BY MOUTH DAILY, Disp: 30 tablet, Rfl: 3   Multiple Vitamin (MULTIVITAMIN WITH MINERALS) TABS, Take 1 tablet by mouth daily., Disp: , Rfl:    triamcinolone cream (KENALOG) 0.1 %, Apply 1 Application topically 2 (two) times daily., Disp: 30 g, Rfl: 0  Review of Systems:  Negative unless indicated in HPI.   Physical Exam: Vitals:   12/12/22 1253  BP: 120/84  Pulse: 82  Temp: 98.6 F (37 C)  TempSrc: Oral  SpO2: 98%  Weight: 196 lb 4.8 oz (89 kg)    Body mass index is 32.67 kg/m.   Physical Exam Musculoskeletal:     Comments: Edema and erythema of right big toe around the nail line      Impression and Plan:  Ingrown right big toenail -     Ambulatory referral to Podiatry -     Doxycycline Hyclate; Take 1 tablet (100 mg total) by mouth 2 (two) times daily for 7  days.  Dispense: 14 tablet; Refill: 0  Paronychia of great toe of right foot -     Ambulatory referral to Podiatry -     Doxycycline Hyclate; Take 1 tablet (100 mg total) by mouth 2 (two) times daily for 7 days.  Dispense: 14 tablet; Refill: 0   -Refer to podiatry for removal of ingrown toenail, I will also send in doxycycline given paronychia.  Time spent:21 minutes reviewing chart, interviewing and examining patient and formulating plan of care.     Chaya Jan, MD [facility]

## 2022-12-17 ENCOUNTER — Other Ambulatory Visit: Payer: Self-pay | Admitting: Podiatry

## 2022-12-18 ENCOUNTER — Encounter: Payer: Self-pay | Admitting: Podiatry

## 2022-12-18 ENCOUNTER — Ambulatory Visit: Payer: Managed Care, Other (non HMO) | Admitting: Podiatry

## 2022-12-18 VITALS — BP 140/82 | HR 72 | Temp 98.0°F | Resp 18 | Ht 65.0 in | Wt 196.0 lb

## 2022-12-18 DIAGNOSIS — L600 Ingrowing nail: Secondary | ICD-10-CM | POA: Diagnosis not present

## 2022-12-18 MED ORDER — NEOMYCIN-POLYMYXIN-HC 3.5-10000-1 OT SOLN: OTIC | 0 refills | Status: AC

## 2022-12-18 NOTE — Patient Instructions (Signed)

## 2022-12-19 NOTE — Progress Notes (Deleted)
Subjective:  Patient ID: Codylee Bajaj, male    DOB: May 06, 1965,  MRN: [id number] HPI Chief Complaint  Patient presents with   Ingrown Toenail    Patient is her for ingrown with infection of  R hallux    57 y.o. male presents with the above complaint.   ***  Past Medical History:  Diagnosis Date   ACUTE GINGIVITIS NONPLAQUE INDUCED 09/07/2009   ALLERGIC RHINITIS 06/17/2008   Arthritis    hands and knees   HYPERTENSION 06/17/2008   on meds   Seasonal allergies    Past Surgical History:  Procedure Laterality Date   COLONOSCOPY  2017   MS-MAC-suprep(good0-TA polyp   FINGER FRACTURE SURGERY Right 1997   middle finger   MASS EXCISION Right 08/14/2016   Procedure: EXCISIONAL BIOPSY MASS RIGHT SMALL FINGER;  Surgeon: Betha Loa, MD;  Location: [facility];  Service: Orthopedics;  Laterality: Right;   POLYPECTOMY  2017   TA   WISDOM TOOTH EXTRACTION      Current Outpatient Medications:    amLODipine (NORVASC) 10 MG tablet, Take 1 tablet (10 mg total) by mouth daily., Disp: 90 tablet, Rfl: 3   cetirizine (ZYRTEC) 10 MG tablet, Take 10 mg by mouth daily., Disp: , Rfl:    dicyclomine (BENTYL) 20 MG tablet, Take 1 tablet (20 mg total) by mouth 2 (two) times daily., Disp: 20 tablet, Rfl: 0   doxycycline (VIBRA-TABS) 100 MG tablet, Take 1 tablet (100 mg total) by mouth 2 (two) times daily for 7 days., Disp: 14 tablet, Rfl: 0   fluticasone (FLONASE) 50 MCG/ACT nasal spray, Place 1 spray into both nostrils daily., Disp: 16 g, Rfl: 6   hydrocortisone (ANUSOL-HC) 25 MG suppository, Place 1 suppository (25 mg total) rectally 2 (two) times daily., Disp: 12 suppository, Rfl: 0   meloxicam (MOBIC) 15 MG tablet, TAKE 1 TABLET(15 MG) BY MOUTH DAILY, Disp: 30 tablet, Rfl: 3   Multiple Vitamin (MULTIVITAMIN WITH MINERALS) TABS, Take 1 tablet by mouth daily., Disp: , Rfl:    neomycin-polymyxin-hydrocortisone (CORTISPORIN) OTIC solution, Apply one to two drops to toe after soaking  twice daily., Disp: 10 mL, Rfl: 0   triamcinolone cream (KENALOG) 0.1 %, Apply 1 Application topically 2 (two) times daily., Disp: 30 g, Rfl: 0  Allergies  Allergen Reactions   Penicillins Swelling   Review of Systems Objective:   Vitals:   12/18/22 1310  BP: (!) 140/82  Pulse: 72  Resp: 18  Temp: 98 F (36.7 C)  SpO2: 99%    General: Well developed, nourished, in no acute distress, alert and oriented x3   Dermatological: Skin is warm, dry and supple bilateral. Nails x 10 are well maintained; remaining integument appears unremarkable at this time. There are no open sores, no preulcerative lesions, no rash or signs of infection present.  Vascular: Dorsalis Pedis artery and Posterior Tibial artery pedal pulses are 2/4 bilateral with immedate capillary fill time. Pedal hair growth present. No varicosities and no lower extremity edema present bilateral.   Neruologic: Grossly intact via light touch bilateral. Vibratory intact via tuning fork bilateral. Protective threshold with Semmes Wienstein monofilament intact to all pedal sites bilateral. Patellar and Achilles deep tendon reflexes 2+ bilateral. No Babinski or clonus noted bilateral.   Musculoskeletal: No gross boney pedal deformities bilateral. No pain, crepitus, or limitation noted with foot and ankle range of motion bilateral. Muscular strength 5/5 in all groups tested bilateral.  Gait: Unassisted, Nonantalgic.    Radiographs:  ***  Assessment &  Plan:   Assessment: ***  Plan: ***     Yaiden Yang T. [place], [place]

## 2022-12-19 NOTE — Progress Notes (Signed)
He presents today as a previous patient chief complaint of an ingrown toenail for quite some time now.  He states that it is right here as he points to the tibial border of the right hallux.  He states that he will soon be working for [employer].  Objective: Vital signs stable alert oriented x 3 there is no erythema edema cellulitis drainage or odor.  Sharply incurvated nail margin with tenderness on palpation..  Assessment ingrown nail tibial border hallux right  Plan: Discussed etiology pathology and surgical therapies at this point performed a chemical matricectomy to the tibial border the hallux right with sodium hydroxide.  He tolerated procedure well without complications.  He was given both oral and written home-going instruction for care and soaking of the toe as well as a prescription for Cortisporin Otic to be applied twice daily after soaking.  Follow-up with him in 2 weeks

## 2023-01-01 ENCOUNTER — Ambulatory Visit: Payer: Managed Care, Other (non HMO) | Admitting: Podiatry

## 2023-04-09 ENCOUNTER — Ambulatory Visit: Payer: Self-pay | Admitting: Family Medicine

## 2023-04-09 NOTE — Telephone Encounter (Signed)
 Copied from CRM [id number]. Topic: Clinical - Red Word Triage >> Apr 09, 2023  9:02 AM Graeme ORN wrote: Red Word that prompted transfer to Nurse Triage: Shoulder and lower back pian - unable to lift arm. Tightness. Pain in back 8 and pain in shoulder   Chief Complaint:  Shoulder and lower back pian - unable to lift arm. Tightness. Pain in back 8 and pain in shoulder  Symptoms: Pain in Shoulder  and Lower back pain Frequency: x 3 days Pertinent Negatives: Patient denies  fever. Disposition: [] ED /[x] Urgent Care (no appt availability in office) / [] Appointment(In office/virtual)/ []  [place] [facility]/ [] Home Care/ [] Refused Recommended Disposition /[] [place] [facility]/ []  Follow-up with PCP Additional Notes: Shceduled with [facility]. Reason for Disposition  Weakness (i.e., loss of strength) in hand or fingers  (Exception: Not truly weak; hand feels weak because of pain.)  [1] Shoulder pains with exertion (e.g., walking) AND [2] pain goes away on resting AND [3] not present now  Answer Assessment - Initial Assessment Questions 1. ONSET: When did the pain start?      1 week ago and progressively getting worse 2. LOCATION: Where is the pain located?      Right Shoulder at the Joint   Hurts  when lifting , carrying or anything   3. PAIN: How bad is the pain? (Scale 1-10; or mild, moderate, severe)   - Rates as 9 out of 10 on pain scale   - SEVERE (8-10): excruciating pain, unable to do any normal activities, unable to move arm at all due to pain      4. WORK OR EXERCISE: Has there been any recent work or exercise that involved this part of the body?      Denies  routine is the same. 5. CAUSE: What do you think is causing the shoulder pain?      Sleeping on that shoulder 6. OTHER SYMPTOMS: Do you have any other symptoms? (e.g., neck pain, swelling, rash, fever, numbness, weakness)       Lower back  Pain   Pain rate 7-8 on pian scale.  Answer Assessment -  Initial Assessment Questions 1. ONSET: When did the pain begin?       Lower back pain 2. LOCATION: Where does it hurt? (upper, mid or lower back)      Right above buttock crack at the lower back 3. SEVERITY: How bad is the pain?  (e.g., Scale 1-10; mild, moderate, or severe)    Rate 8 on pain scale    - SEVERE (8-10): Excruciating pain, unable to do any normal activities.       4. PATTERN: Is the pain constant? (e.g., yes, no; constant, intermittent)        In the morning - It is very tight when he wakes it the morning  5. RADIATION: Does the pain shoot into your legs or somewhere else?      Sometimes down the left side this leg 6. CAUSE:  What do you think is causing the back pain?       Tightness in back - He has  back pain  for quite sometime . 7. BACK OVERUSE:  Any recent lifting of heavy objects, strenuous work or exercise?      Denies 8. MEDICINES: What have you taken so far for the pain? (e.g., nothing, acetaminophen , NSAIDS)      NSAID  Ibuprofen  9. NEUROLOGIC SYMPTOMS: Do you have any weakness, numbness, or problems with  bowel/bladder control?      Denies 10. OTHER SYMPTOMS: Do you have any other symptoms? (e.g., fever, abdomen pain, burning with urination, blood in urine)        Denies  Protocols used: Shoulder Pain-A-AH, Back Pain-A-AH

## 2023-04-10 ENCOUNTER — Ambulatory Visit: Payer: Self-pay | Admitting: Family Medicine

## 2023-04-10 NOTE — Telephone Encounter (Signed)
  Chief Complaint: low back pain, R shoulder pain Symptoms: pain, occasional numbness Frequency: Back- 1 month Shoulder- 1 week Both described as intermittent  Pertinent Negatives: Patient denies loss of bowel or bladder, numbness at this time Disposition: [] ED /[] Urgent Care (no appt availability in office) / [] Appointment(In office/virtual)/ []  [place] [facility]/ [] Home Care/ [x] Refused Recommended Disposition /[] [facility]/ []  Follow-up with PCP Additional Notes: Patient calls stating he has low back pain x 1 month and R shoulder pain x 1 week. Patient reports his back pain is chronic, but worsening. 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 Denies numbness or tingling at this time. Per protocol, patient to be evaluated within 24 hours. Next available with any provider is 04/12/23 @ 0820. Offered to schedule with urgent care. Patient declined appt stating he is seeing his PCP On 1/20. Care advice reviewed. Alerting PCP for review.  Copied from CRM [id number]. Topic: Clinical - Red Word Triage >> Apr 10, 2023  9:08 AM Aline Ireland wrote: Red Word that prompted transfer to Nurse Triage: Pt stated that he has back and shoulder sever pain and its been over a week for both, and would like to schedule an appointment. Reason for Disposition  Numbness in a leg or foot (i.e., loss of sensation)  Answer Assessment - Initial Assessment Questions 1. ONSET: "When did the pain begin?"      Back pain approx 4 weeks, R shoulder 1 week 2. LOCATION: "Where does it hurt?" (upper, mid or lower back)     Low back and R shoulder 3. SEVERITY: "How bad is the pain?"  (e.g., Scale 1-10; mild, moderate, or severe)   - MILD (1-3): Doesn't interfere with normal activities.    - MODERATE (4-7): Interferes with normal activities or awakens from sleep.    - SEVERE (8-10): Excruciating pain, unable to do any normal activities.      States chronic in nature,  but worsening 8/10 back, 8/10 on shoulder with use 4. PATTERN: "Is the pain constant?" (e.g., yes, no; constant, intermittent)      Back pain constant, shoulder with use 5. RADIATION: "Does the pain shoot into your legs or somewhere else?"     Occasionally numb in L leg 6. CAUSE:  "What do you think is causing the back pain?"      Open and closes tractor trailer doors 7. BACK OVERUSE:  "Any recent lifting of heavy objects, strenuous work or exercise?"     Yes, for work 8. MEDICINES: "What have you taken so far for the pain?" (e.g., nothing, acetaminophen , NSAIDS)     Ibuprofen , minimal relief 9. NEUROLOGIC SYMPTOMS: "Do you have any weakness, numbness, or problems with bowel/bladder control?"     States numbness occasionally down L leg 10. OTHER SYMPTOMS: "Do you have any other symptoms?" (e.g., fever, abdomen pain, burning with urination, blood in urine)       Denies  Protocols used: Back Pain-A-AH

## 2023-04-15 ENCOUNTER — Encounter: Payer: Self-pay | Admitting: Family Medicine

## 2023-04-15 ENCOUNTER — Ambulatory Visit: Payer: Managed Care, Other (non HMO) | Admitting: Family Medicine

## 2023-04-15 ENCOUNTER — Ambulatory Visit (INDEPENDENT_AMBULATORY_CARE_PROVIDER_SITE_OTHER): Payer: Managed Care, Other (non HMO)

## 2023-04-15 VITALS — BP 130/78 | HR 76 | Temp 98.2°F | Ht 65.0 in | Wt 189.2 lb

## 2023-04-15 DIAGNOSIS — M51379 Other intervertebral disc degeneration, lumbosacral region without mention of lumbar back pain or lower extremity pain: Secondary | ICD-10-CM

## 2023-04-15 DIAGNOSIS — M25511 Pain in right shoulder: Secondary | ICD-10-CM

## 2023-04-15 DIAGNOSIS — J302 Other seasonal allergic rhinitis: Secondary | ICD-10-CM | POA: Diagnosis not present

## 2023-04-15 DIAGNOSIS — M545 Low back pain, unspecified: Secondary | ICD-10-CM

## 2023-04-15 DIAGNOSIS — G8929 Other chronic pain: Secondary | ICD-10-CM

## 2023-04-15 MED ORDER — MELOXICAM 15 MG PO TABS: 15.0000 mg | ORAL_TABLET | Freq: Every day | ORAL | 1 refills | Status: DC

## 2023-04-15 MED ORDER — CYCLOBENZAPRINE HCL 10 MG PO TABS: 10.0000 mg | ORAL_TABLET | Freq: Three times a day (TID) | ORAL | 1 refills | Status: AC | PRN

## 2023-04-15 MED ORDER — MONTELUKAST SODIUM 10 MG PO TABS: 10.0000 mg | ORAL_TABLET | Freq: Every day | ORAL | 3 refills | Status: AC

## 2023-04-15 NOTE — Progress Notes (Signed)
Established Patient Office Visit   Subjective  Patient ID: Philip Cooley, male    DOB: [birth year]  Age: 58 y.o. MRN: [id number]  Chief Complaint  Patient presents with   Back Pain    Lower Back pain started a month ago, rate of pain 8/10, cont.    Shoulder Pain    Right shoulder pain Started a 1 1/2 ago, patient is having pain when using the arm, rate of pain 9 out of 10,     Patient is a 58 year old male seen for follow-up and acute concern.  Patient endorses acute right shoulder pain x 1 week.  Notes symptoms with reaching overhead and sleeping on right side.  Pain noted as 8/10 deep in shoulder joint.  Worse with movement.  Patient denies injury.  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  Tried heat, lidocaine patch, ibuprofen for symptoms.  Patient also endorses midline low back pain with radiation to the sides.  Occasionally has paresthesias in left lateral thigh.  Denies LE weakness.  Patient has a history of arthritis and low back as seen on MRI.  Typically has sciatica with low back pain.  Denies posterior thigh pain or paresthesias.    Patient Active Problem List   Diagnosis Date Noted   Ocular migraine 03/28/2022   Seasonal allergies 03/28/2022   Bilateral leg numbness 03/28/2022   Langerhan's cell histiocytosis (HCC) 07/24/2021   Chronic cough 06/27/2021   Polycythemia 02/09/2020   History of back strain 12/19/2018   ACUTE GINGIVITIS NONPLAQUE INDUCED 09/07/2009   Essential hypertension 06/17/2008   Allergic rhinitis 06/17/2008   Past Medical History:  Diagnosis Date   ACUTE GINGIVITIS NONPLAQUE INDUCED 09/07/2009   ALLERGIC RHINITIS 06/17/2008   Arthritis    hands and knees   HYPERTENSION 06/17/2008   on meds   Seasonal allergies    Past Surgical History:  Procedure Laterality Date   COLONOSCOPY  2017   MS-MAC-suprep(good0-TA polyp   FINGER FRACTURE SURGERY Right 1997   middle finger   MASS EXCISION Right 08/14/2016   Procedure: EXCISIONAL  BIOPSY MASS RIGHT SMALL FINGER;  Surgeon: Betha Loa, MD;  Location: [facility];  Service: Orthopedics;  Laterality: Right;   POLYPECTOMY  2017   TA   WISDOM TOOTH EXTRACTION     Social History   Tobacco Use   Smoking status: Never   Smokeless tobacco: Never  Vaping Use   Vaping status: Never Used  Substance Use Topics   Alcohol use: No   Drug use: No   Family History  Problem Relation Age of Onset   Diabetes Mother    Cancer Mother    Colon cancer Neg Hx    Colon polyps Neg Hx    Esophageal cancer Neg Hx    Rectal cancer Neg Hx    Stomach cancer Neg Hx    Allergies  Allergen Reactions   Penicillins Swelling      ROS Negative unless stated above    Objective:     BP 130/78 (BP Location: Right Arm, Patient Position: Sitting, Cuff Size: Large)   Pulse 76   Temp 98.2 F (36.8 C) (Oral)   Ht 5\' 5"  (1.651 m)   Wt 189 lb 3.2 oz (85.8 kg)   SpO2 97%   BMI 31.48 kg/m  BP Readings from Last 3 Encounters:  04/15/23 130/78  12/18/22 (!) 140/82  12/12/22 120/84   Wt Readings from Last 3 Encounters:  04/15/23 189 lb 3.2 oz (85.8  kg)  12/18/22 196 lb (88.9 kg)  12/12/22 196 lb 4.8 oz (89 kg)     Physical Exam Constitutional:      General: He is not in acute distress.    Appearance: Normal appearance.  HENT:     Head: Normocephalic and atraumatic.     Nose: Nose normal.     Mouth/Throat:     Mouth: Mucous membranes are moist.  Cardiovascular:     Rate and Rhythm: Normal rate and regular rhythm.     Heart sounds: Normal heart sounds. No murmur heard.    No gallop.  Pulmonary:     Effort: Pulmonary effort is normal. No respiratory distress.     Breath sounds: Normal breath sounds. No wheezing, rhonchi or rales.  Musculoskeletal:     Right shoulder: Bony tenderness and crepitus present. Decreased range of motion.     Left shoulder: Normal.     Cervical back: Normal.     Thoracic back: Normal.     Lumbar back: Tenderness and bony  tenderness present.       Back:     Comments: No TTP of clavicle, posterior shoulder, or scapula.  Decreased active and passive ROM of right shoulder.  Positive Hawkins, Neer's, apprehension, empty can, cross arm.  Skin:    General: Skin is warm and dry.  Neurological:     Mental Status: He is alert and oriented to person, place, and time.     No results found for any visits on 04/15/23.    Assessment & Plan:  Acute pain of right shoulder -concern for OA, labral tear, rotator cuff tear -continue supportive care with heat, stretching, topical analgesics, rest, Tylenol or NSAIDs. -Rx for Flexeril and Mobic sent to pharmacy. -Will obtain x-ray given degree of discomfort.  Advised for continued or worsening symptoms MRI may be warranted to further evaluate for labral tear or rotator cuff injury.  Patient wishes to proceed with x-ray and reevaluate if needed. -     DG Shoulder Right; Future -     Cyclobenzaprine HCl; Take 1 tablet (10 mg total) by mouth 3 (three) times daily as needed for muscle spasms.  Dispense: 90 tablet; Refill: 1 -     Meloxicam; Take 1 tablet (15 mg total) by mouth daily.  Dispense: 30 tablet; Refill: 1  Chronic midline low back pain without sciatica -Likely 2/2 arthritis or worsening disc herniation at L5-S1 previously noted on MRI of lumbar spine from 03/28/2018 -Continue supportive care including heat, stretching, massage, topical analgesics -Rx for Flexeril and Mobic sent to pharmacy -     Cyclobenzaprine HCl; Take 1 tablet (10 mg total) by mouth 3 (three) times daily as needed for muscle spasms.  Dispense: 90 tablet; Refill: 1 -     Meloxicam; Take 1 tablet (15 mg total) by mouth daily.  Dispense: 30 tablet; Refill: 1  Seasonal allergies -Increased symptoms such as watery eyes since out of Singulair -Continue Zyrtec and Singulair -For continued or worsening symptoms consider allergist referral -     Montelukast Sodium; Take 1 tablet (10 mg total) by mouth at  bedtime.  Dispense: 90 tablet; Refill: 3  Degenerative disc disease at L5-S1 level -Noted on MRI spine without contrast 03/28/2018: Symptomatic level appears to be L5-S1 where moderate size disc extrusion into the left lateral recess severely affects the descending left S1 nerve.  Lesser disc degeneration at L3-4.  Degenerative endplate Schmorl nodes at that level, and at the superior L3 endplate with mild  associated marrow edema. -Continue supportive care including stretching daily -For worsening symptoms follow-up with Ortho or neurosurgery.   Return in about 3 weeks (around 05/06/2023), or if symptoms worsen or fail to improve.   Deeann Saint, MD

## 2023-04-28 ENCOUNTER — Encounter: Payer: Self-pay | Admitting: Family Medicine

## 2023-05-21 ENCOUNTER — Ambulatory Visit: Payer: Managed Care, Other (non HMO) | Admitting: Family Medicine

## 2023-05-21 ENCOUNTER — Encounter: Payer: Self-pay | Admitting: Family Medicine

## 2023-05-21 VITALS — BP 120/72 | HR 65 | Temp 99.5°F | Ht 65.0 in | Wt 190.2 lb

## 2023-05-21 DIAGNOSIS — R52 Pain, unspecified: Secondary | ICD-10-CM | POA: Diagnosis not present

## 2023-05-21 DIAGNOSIS — J029 Acute pharyngitis, unspecified: Secondary | ICD-10-CM | POA: Diagnosis not present

## 2023-05-21 DIAGNOSIS — J101 Influenza due to other identified influenza virus with other respiratory manifestations: Secondary | ICD-10-CM

## 2023-05-21 LAB — POCT INFLUENZA A/B
Influenza A, POC: POSITIVE — AB
Influenza B, POC: NEGATIVE

## 2023-05-21 LAB — POCT RAPID STREP A (OFFICE): Rapid Strep A Screen: NEGATIVE

## 2023-05-21 LAB — POC COVID19 BINAXNOW: SARS Coronavirus 2 Ag: NEGATIVE

## 2023-05-21 MED ORDER — OSELTAMIVIR PHOSPHATE 75 MG PO CAPS: 75.0000 mg | ORAL_CAPSULE | Freq: Two times a day (BID) | ORAL | 0 refills | Status: AC

## 2023-05-21 NOTE — Progress Notes (Signed)
 Acute Office Visit   Subjective:  Patient ID: Philip Cooley, male    DOB: 02/17/1966, 58 y.o.   MRN: [id number]  Chief Complaint  Patient presents with   Cough    Cough and congestion, body aches, fever, sore throat, Binns and yellow mucus, started 2 days ago    Cough   Patient is present for an acute visit. He is complaining of cough (varies between productive and non-productive), chest congestion, body aches, fever, headache ("from coughing), left ear irration, sore throat. Sputum is Hagey, yellow   Denies chest pain, SHOB, nasal congestion or drainage, abd pain, nausea, or vomiting.   Symptoms started 2 days ago.   He reports has tried Tylenol, Ibuprofen, Nyquil and vitamin C. He reports the medication is not really helping.   Denies being around anyone ill and denies any recent travel.   Review of Systems  Respiratory:  Positive for cough.    See HPI above      Objective:   BP 120/72 (BP Location: Left Arm, Patient Position: Sitting, Cuff Size: Large)   Pulse 65   Temp 99.5 F (37.5 C) (Oral)   Ht 5\' 5"  (1.651 m)   Wt 190 lb 3.2 oz (86.3 kg)   SpO2 98%   BMI 31.65 kg/m    Physical Exam Vitals reviewed.  Constitutional:      General: He is not in acute distress.    Appearance: Normal appearance. He is not ill-appearing (Mild), toxic-appearing or diaphoretic.  HENT:     Head: Normocephalic and atraumatic.     Right Ear: Ear canal and external ear normal. There is no impacted cerumen.     Left Ear: Tympanic membrane, ear canal and external ear normal. There is no impacted cerumen.     Ears:     Comments: Clear fluid behind right TM.     Nose:     Right Sinus: No maxillary sinus tenderness or frontal sinus tenderness.     Left Sinus: No maxillary sinus tenderness or frontal sinus tenderness.  Eyes:     General:        Right eye: No discharge.        Left eye: No discharge.     Conjunctiva/sclera: Conjunctivae normal.  Cardiovascular:     Rate and  Rhythm: Normal rate and regular rhythm.     Heart sounds: Normal heart sounds. No murmur heard.    No friction rub. No gallop.  Pulmonary:     Effort: Pulmonary effort is normal. No respiratory distress.     Breath sounds: Normal breath sounds.  Musculoskeletal:        General: Normal range of motion.  Skin:    General: Skin is warm and dry.  Neurological:     General: No focal deficit present.     Mental Status: He is alert and oriented to person, place, and time. Mental status is at baseline.  Psychiatric:        Mood and Affect: Mood normal.        Behavior: Behavior normal.        Thought Content: Thought content normal.        Judgment: Judgment normal.     Results for orders placed or performed in visit on 05/21/23  POC COVID-19  Result Value Ref Range   SARS Coronavirus 2 Ag Negative Negative  POC Rapid Strep A  Result Value Ref Range   Rapid Strep A Screen Negative Negative  POC Influenza A/B  Result Value Ref Range   Influenza A, POC Positive (A) Negative   Influenza B, POC Negative Negative      Assessment & Plan:  Body aches -     POC COVID-19 BinaxNow -     POCT Influenza A/B  Sore throat -     POCT rapid strep A  Influenza A -     Oseltamivir Phosphate; Take 1 capsule (75 mg total) by mouth 2 (two) times daily for 5 days.  Dispense: 10 capsule; Refill: 0  -POSITIVE for Influenza A.  -Negative for covid and strep throat. -Prescribed Tamiflu 75mg  tablet, take 1 tablet every 12 hours (twice a day) for 5 days.  -Recommend to rest, hydrate.  -Recommend supportative care.  -Advised he may take over the counter, Mucinex, Flonase, Nyquil, or Dayquil if needed.  -Alternate Tylenol 1000mg  and Ibuprofen 600-800mg  every 4 hours for fever, headache, and body aches. Recommend to eat something when taking Ibuprofen.   -Work note provided. Return back to work on Monday, March 3rd.  -Follow up if not improved.   No follow-ups on file.  Zandra Abts, NP

## 2023-05-21 NOTE — Patient Instructions (Addendum)
-  POSITIVE for Flu -Negative for covid and strep throat. -Prescribed Tamiflu 75mg  tablet, take 1 tablet every 12 hours (twice a day) for 5 days.  -Recommend to rest, hydrate.  -Recommend supportative care.  -You may take over the counter, Mucinex, Flonase, Nyquil, or Dayquil if needed.  -Alternate Tylenol 1000mg  and Ibuprofen 600-800mg  every 4 hours for fever, headache, and body aches. Recommend to eat something when taking Ibuprofen.   -Work note provided. Return back to work on Monday, March 3rd.  -Follow up if not improved.

## 2023-06-06 ENCOUNTER — Encounter: Payer: Self-pay | Admitting: Family Medicine

## 2023-06-06 ENCOUNTER — Ambulatory Visit: Admitting: Family Medicine

## 2023-06-06 VITALS — BP 122/74 | HR 73 | Temp 98.5°F | Ht 65.0 in | Wt 187.4 lb

## 2023-06-06 DIAGNOSIS — J018 Other acute sinusitis: Secondary | ICD-10-CM | POA: Diagnosis not present

## 2023-06-06 DIAGNOSIS — M25511 Pain in right shoulder: Secondary | ICD-10-CM

## 2023-06-06 DIAGNOSIS — R0982 Postnasal drip: Secondary | ICD-10-CM

## 2023-06-06 MED ORDER — AZITHROMYCIN 250 MG PO TABS: ORAL_TABLET | ORAL | 0 refills | Status: AC

## 2023-06-06 NOTE — Progress Notes (Signed)
 Established Patient Office Visit   Subjective  Patient ID: Philip Cooley, male    DOB: 1965/04/21  Age: 58 y.o. MRN: [id number]  Chief Complaint  Patient presents with   Medical Management of Chronic Issues    Follow-up for back and shoulder pain, back pain is better, patient is still having yellow/Berkley mucus with post nasal drainage from the flu     Patient is a 58 year old male seen for follow-up.  Patient endorses some improvement in right shoulder pain.  Not as painful but still feels a pulling sensation with certain movements.  Pain now 6/10.  Pt seen on 05/21/2023 in clinic by another provider for acute viral URI symptoms.  Patient diagnosed with influenza A.  Started Tamiflu.  Patient states Tamiflu caused increased pain and rash.  Patient feeling better but still with occasional cough and yellow-Bloodsaw drainage.  Denies facial pain/pressure, ear pain/pressure, sore throat, fever, chills.  Now taking OTC see cough/cold medication for people with HBP    Patient Active Problem List   Diagnosis Date Noted   Ocular migraine 03/28/2022   Seasonal allergies 03/28/2022   Bilateral leg numbness 03/28/2022   Langerhan's cell histiocytosis (HCC) 07/24/2021   Chronic cough 06/27/2021   Polycythemia 02/09/2020   History of back strain 12/19/2018   ACUTE GINGIVITIS NONPLAQUE INDUCED 09/07/2009   Essential hypertension 06/17/2008   Allergic rhinitis 06/17/2008   Past Medical History:  Diagnosis Date   ACUTE GINGIVITIS NONPLAQUE INDUCED 09/07/2009   ALLERGIC RHINITIS 06/17/2008   Arthritis    hands and knees   HYPERTENSION 06/17/2008   on meds   Seasonal allergies    Past Surgical History:  Procedure Laterality Date   COLONOSCOPY  2017   MS-MAC-suprep(good0-TA polyp   FINGER FRACTURE SURGERY Right 1997   middle finger   MASS EXCISION Right 08/14/2016   Procedure: EXCISIONAL BIOPSY MASS RIGHT SMALL FINGER;  Surgeon: Betha Loa, MD;  Location: [facility];   Service: Orthopedics;  Laterality: Right;   POLYPECTOMY  2017   TA   WISDOM TOOTH EXTRACTION     Social History   Tobacco Use   Smoking status: Never   Smokeless tobacco: Never  Vaping Use   Vaping status: Never Used  Substance Use Topics   Alcohol use: No   Drug use: No   Family History  Problem Relation Age of Onset   Diabetes Mother    Cancer Mother    Colon cancer Neg Hx    Colon polyps Neg Hx    Esophageal cancer Neg Hx    Rectal cancer Neg Hx    Stomach cancer Neg Hx    Allergies  Allergen Reactions   Penicillins Swelling      ROS Negative unless stated above    Objective:     BP 122/74 (BP Location: Right Arm, Patient Position: Sitting, Cuff Size: Normal)   Pulse 73   Temp 98.5 F (36.9 C) (Oral)   Ht 5\' 5"  (1.651 m)   Wt 187 lb 6.4 oz (85 kg)   SpO2 97%   BMI 31.18 kg/m  BP Readings from Last 3 Encounters:  06/06/23 122/74  05/21/23 120/72  04/15/23 130/78   Wt Readings from Last 3 Encounters:  06/06/23 187 lb 6.4 oz (85 kg)  05/21/23 190 lb 3.2 oz (86.3 kg)  04/15/23 189 lb 3.2 oz (85.8 kg)      Physical Exam Constitutional:      General: He is not in acute distress.  Appearance: Normal appearance.  HENT:     Head: Normocephalic and atraumatic.     Ears:     Comments: Bilateral TMs full.  No suppurative fluid or erythema noted.    Nose: Nose normal.     Mouth/Throat:     Mouth: Mucous membranes are moist.  Cardiovascular:     Rate and Rhythm: Normal rate and regular rhythm.     Heart sounds: Normal heart sounds. No murmur heard.    No gallop.  Pulmonary:     Effort: Pulmonary effort is normal. No respiratory distress.     Breath sounds: Normal breath sounds. No wheezing, rhonchi or rales.  Skin:    General: Skin is warm and dry.  Neurological:     Mental Status: He is alert and oriented to person, place, and time.      No results found for any visits on 06/06/23.    Assessment & Plan:  Other subacute sinusitis -      Azithromycin; Take 2 tablets on day 1, then 1 tablet daily on days 2 through 5  Dispense: 6 tablet; Refill: 0  Acute pain of right shoulder  Post-nasal drainage  Start ABX for presumed sinusitis status post influenza infection.  Penicillin allergy.  Continued cough likely 2/2 postnasal drainage/postviral etiology.  Continue OTC antihistamines, Nettie pot, Singulair.  Continue supportive care for right shoulder pain.  X-ray from 04/15/2023 negative.  For continued or worsening shoulder symptoms discussed MRI.  Return if symptoms worsen or fail to improve.   Deeann Saint, MD

## 2023-06-18 ENCOUNTER — Other Ambulatory Visit: Payer: Self-pay | Admitting: Family Medicine

## 2023-06-18 DIAGNOSIS — M25511 Pain in right shoulder: Secondary | ICD-10-CM

## 2023-06-18 DIAGNOSIS — M545 Low back pain, unspecified: Secondary | ICD-10-CM

## 2023-10-08 ENCOUNTER — Other Ambulatory Visit: Payer: Self-pay | Admitting: Family Medicine

## 2023-10-08 DIAGNOSIS — I10 Essential (primary) hypertension: Secondary | ICD-10-CM

## 2023-11-04 ENCOUNTER — Encounter: Payer: Self-pay | Admitting: Family Medicine

## 2023-11-04 ENCOUNTER — Ambulatory Visit: Admitting: Family Medicine

## 2023-11-04 VITALS — BP 118/80 | HR 75 | Temp 98.4°F | Ht 65.0 in | Wt 189.6 lb

## 2023-11-04 DIAGNOSIS — J3489 Other specified disorders of nose and nasal sinuses: Secondary | ICD-10-CM

## 2023-11-04 DIAGNOSIS — H66002 Acute suppurative otitis media without spontaneous rupture of ear drum, left ear: Secondary | ICD-10-CM | POA: Diagnosis not present

## 2023-11-04 MED ORDER — DOXYCYCLINE HYCLATE 100 MG PO TABS: 100.0000 mg | ORAL_TABLET | Freq: Two times a day (BID) | ORAL | 0 refills | Status: AC

## 2023-11-04 MED ORDER — FLUTICASONE PROPIONATE 50 MCG/ACT NA SUSP: 1.0000 | Freq: Every day | NASAL | 6 refills | Status: AC

## 2023-11-04 NOTE — Progress Notes (Signed)
 Established Patient Office Visit   Subjective  Patient ID: Philip Cooley, male    DOB: [birth year]  Age: 58 y.o. MRN: [id number]  No chief complaint on file.   Patient is a 58 year old male seen for acute concern.  Pt with nasal pressure, HA, rhinorrhea, and sneezing x 3 days.  Pt also with L ear feeling clogged.  Denies cough, sore throat, facial pain.  Tried alka seltzer cold plus, zyrtec, and flonase .  Feeling somewhat better.  Out of work yesterday and today, requesting note.  Pt got engaged and is getting married this Sunday.    Patient Active Problem List   Diagnosis Date Noted   Ocular migraine 03/28/2022   Seasonal allergies 03/28/2022   Bilateral leg numbness 03/28/2022   Langerhan's cell histiocytosis (HCC) 07/24/2021   Chronic cough 06/27/2021   Polycythemia 02/09/2020   History of back strain 12/19/2018   ACUTE GINGIVITIS NONPLAQUE INDUCED 09/07/2009   Essential hypertension 06/17/2008   Allergic rhinitis 06/17/2008   Past Medical History:  Diagnosis Date   ACUTE GINGIVITIS NONPLAQUE INDUCED 09/07/2009   ALLERGIC RHINITIS 06/17/2008   Arthritis    hands and knees   HYPERTENSION 06/17/2008   on meds   Seasonal allergies    Past Surgical History:  Procedure Laterality Date   COLONOSCOPY  2017   MS-MAC-suprep(good0-TA polyp   FINGER FRACTURE SURGERY Right 1997   middle finger   MASS EXCISION Right 08/14/2016   Procedure: EXCISIONAL BIOPSY MASS RIGHT SMALL FINGER;  Surgeon: Murrell Drivers, MD;  Location: [facility];  Service: Orthopedics;  Laterality: Right;   POLYPECTOMY  2017   TA   WISDOM TOOTH EXTRACTION     Social History   Tobacco Use   Smoking status: Never   Smokeless tobacco: Never  Vaping Use   Vaping status: Never Used  Substance Use Topics   Alcohol use: No   Drug use: No   Family History  Problem Relation Age of Onset   Diabetes Mother    Cancer Mother    Colon cancer Neg Hx    Colon polyps Neg Hx    Esophageal  cancer Neg Hx    Rectal cancer Neg Hx    Stomach cancer Neg Hx    Allergies  Allergen Reactions   Penicillins Swelling    ROS Negative unless stated above    Objective:     There were no vitals taken for this visit. BP Readings from Last 3 Encounters:  06/06/23 122/74  05/21/23 120/72  04/15/23 130/78   Wt Readings from Last 3 Encounters:  06/06/23 187 lb 6.4 oz (85 kg)  05/21/23 190 lb 3.2 oz (86.3 kg)  04/15/23 189 lb 3.2 oz (85.8 kg)      Physical Exam Constitutional:      General: He is not in acute distress.    Appearance: Normal appearance.  HENT:     Head: Normocephalic and atraumatic.     Left Ear: Tympanic membrane is bulging.     Ears:     Comments: L TM with erythema, suppurative fluid    Nose: Nose normal.     Mouth/Throat:     Mouth: Mucous membranes are moist.  Cardiovascular:     Rate and Rhythm: Normal rate and regular rhythm.     Heart sounds: Normal heart sounds. No murmur heard.    No gallop.  Pulmonary:     Effort: Pulmonary effort is normal. No respiratory distress.     Breath sounds: Normal breath  sounds. No wheezing, rhonchi or rales.  Skin:    General: Skin is warm and dry.  Neurological:     Mental Status: He is alert and oriented to person, place, and time.        04/15/2023    2:47 PM 12/12/2022   12:58 PM 04/18/2022    3:54 PM  Depression screen PHQ 2/9  Decreased Interest 0 0 0  Down, Depressed, Hopeless 0 0 0  PHQ - 2 Score 0 0 0  Altered sleeping 0 0 1  Tired, decreased energy 0 0 0  Change in appetite 0 0 0  Feeling bad or failure about yourself  0 0 0  Trouble concentrating 0 0 0  Moving slowly or fidgety/restless 0 0 0  Suicidal thoughts 0 0 0  PHQ-9 Score 0 0 1  Difficult doing work/chores   Not difficult at all      04/15/2023    2:47 PM 12/12/2022   12:58 PM 04/18/2022    3:54 PM  GAD 7 : Generalized Anxiety Score  Nervous, Anxious, on Edge 0 0 0  Control/stop worrying 0 0 0  Worry too much - different  things 0 0 0  Trouble relaxing 0 0 0  Restless 0 0 0  Easily annoyed or irritable 0 0 0  Afraid - awful might happen 0 0 0  Total GAD 7 Score 0 0 0     No results found for any visits on 11/04/23.    Assessment & Plan:   Acute suppurative otitis media of left ear without spontaneous rupture of tympanic membrane, recurrence not specified -     Doxycycline  Hyclate; Take 1 tablet (100 mg total) by mouth 2 (two) times daily for 7 days.  Dispense: 14 tablet; Refill: 0  Rhinorrhea -     Fluticasone  Propionate; Place 1 spray into both nostrils daily.  Dispense: 16 g; Refill: 6  Start Doxy for left AOM.  History of PCN allergy.  Continue supportive care with Flonase , antihistamine, rest, hydration, OTC cold medication Tylenol  or NSAIDs as needed.  Given note for work.  Given precautions.  Return if symptoms worsen or fail to improve.   Clotilda JONELLE Single, MD

## 2024-01-12 ENCOUNTER — Other Ambulatory Visit: Payer: Self-pay | Admitting: Family Medicine

## 2024-01-12 DIAGNOSIS — I10 Essential (primary) hypertension: Secondary | ICD-10-CM
